# Patient Record
Sex: Female | Born: 1935 | Race: Asian | Hispanic: No | Smoking: Never smoker
Health system: Southern US, Community
[De-identification: ages and names within clinical notes are randomized; demographics above are authoritative.]

## PROBLEM LIST (undated history)

## (undated) DIAGNOSIS — I10 Essential (primary) hypertension: Secondary | ICD-10-CM

## (undated) DIAGNOSIS — L299 Pruritus, unspecified: Secondary | ICD-10-CM

## (undated) DIAGNOSIS — IMO0002 Reserved for concepts with insufficient information to code with codable children: Secondary | ICD-10-CM

## (undated) DIAGNOSIS — E119 Type 2 diabetes mellitus without complications: Secondary | ICD-10-CM

## (undated) HISTORY — PX: BACK SURGERY: SHX140

## (undated) HISTORY — PX: DILATION AND CURETTAGE, DIAGNOSTIC / THERAPEUTIC: SUR384

---

## 2004-10-13 ENCOUNTER — Ambulatory Visit: Payer: Self-pay | Admitting: Internal Medicine

## 2004-10-20 ENCOUNTER — Ambulatory Visit: Payer: Self-pay | Admitting: Internal Medicine

## 2004-11-10 ENCOUNTER — Ambulatory Visit: Payer: Self-pay | Admitting: Internal Medicine

## 2004-11-21 ENCOUNTER — Ambulatory Visit (HOSPITAL_COMMUNITY): Admission: RE | Admit: 2004-11-21 | Discharge: 2004-11-21 | Payer: Self-pay | Admitting: Family Medicine

## 2004-11-24 ENCOUNTER — Ambulatory Visit: Payer: Self-pay | Admitting: Internal Medicine

## 2004-12-08 ENCOUNTER — Ambulatory Visit: Payer: Self-pay | Admitting: Internal Medicine

## 2005-02-16 ENCOUNTER — Ambulatory Visit: Payer: Self-pay | Admitting: Internal Medicine

## 2005-06-22 ENCOUNTER — Ambulatory Visit: Payer: Self-pay | Admitting: Internal Medicine

## 2005-09-23 ENCOUNTER — Ambulatory Visit: Payer: Self-pay | Admitting: Family Medicine

## 2005-10-12 ENCOUNTER — Ambulatory Visit: Payer: Self-pay | Admitting: Internal Medicine

## 2005-12-16 ENCOUNTER — Ambulatory Visit: Payer: Self-pay | Admitting: Internal Medicine

## 2005-12-24 ENCOUNTER — Ambulatory Visit (HOSPITAL_COMMUNITY): Admission: RE | Admit: 2005-12-24 | Discharge: 2005-12-24 | Payer: Self-pay | Admitting: Family Medicine

## 2006-02-19 ENCOUNTER — Ambulatory Visit: Payer: Self-pay | Admitting: Internal Medicine

## 2006-03-03 ENCOUNTER — Ambulatory Visit: Payer: Self-pay | Admitting: Internal Medicine

## 2006-03-12 ENCOUNTER — Ambulatory Visit (HOSPITAL_COMMUNITY): Admission: RE | Admit: 2006-03-12 | Discharge: 2006-03-12 | Payer: Self-pay | Admitting: Internal Medicine

## 2006-03-12 ENCOUNTER — Ambulatory Visit: Payer: Self-pay | Admitting: Internal Medicine

## 2006-03-19 ENCOUNTER — Ambulatory Visit: Payer: Self-pay | Admitting: Internal Medicine

## 2006-03-23 ENCOUNTER — Ambulatory Visit: Payer: Self-pay | Admitting: Internal Medicine

## 2006-06-07 ENCOUNTER — Ambulatory Visit: Payer: Self-pay | Admitting: Internal Medicine

## 2006-11-30 ENCOUNTER — Ambulatory Visit: Payer: Self-pay | Admitting: Internal Medicine

## 2006-12-28 ENCOUNTER — Ambulatory Visit (HOSPITAL_COMMUNITY): Admission: RE | Admit: 2006-12-28 | Discharge: 2006-12-28 | Payer: Self-pay | Admitting: Internal Medicine

## 2007-01-14 ENCOUNTER — Encounter: Admission: RE | Admit: 2007-01-14 | Discharge: 2007-01-14 | Payer: Self-pay | Admitting: Internal Medicine

## 2007-05-30 DIAGNOSIS — L738 Other specified follicular disorders: Secondary | ICD-10-CM | POA: Insufficient documentation

## 2007-05-30 DIAGNOSIS — J309 Allergic rhinitis, unspecified: Secondary | ICD-10-CM | POA: Insufficient documentation

## 2007-05-30 DIAGNOSIS — E785 Hyperlipidemia, unspecified: Secondary | ICD-10-CM | POA: Insufficient documentation

## 2007-05-30 DIAGNOSIS — I10 Essential (primary) hypertension: Secondary | ICD-10-CM | POA: Insufficient documentation

## 2008-03-21 ENCOUNTER — Ambulatory Visit (HOSPITAL_COMMUNITY): Admission: RE | Admit: 2008-03-21 | Discharge: 2008-03-21 | Payer: Self-pay | Admitting: Internal Medicine

## 2009-04-08 ENCOUNTER — Ambulatory Visit (HOSPITAL_COMMUNITY): Admission: RE | Admit: 2009-04-08 | Discharge: 2009-04-08 | Payer: Self-pay | Admitting: Specialist

## 2010-02-18 HISTORY — PX: ANTERIOR CERVICAL DECOMP/DISCECTOMY FUSION: SHX1161

## 2010-03-04 ENCOUNTER — Inpatient Hospital Stay (HOSPITAL_COMMUNITY): Admission: RE | Admit: 2010-03-04 | Discharge: 2010-03-08 | Payer: Self-pay | Admitting: Neurosurgery

## 2011-01-11 ENCOUNTER — Encounter: Payer: Self-pay | Admitting: Internal Medicine

## 2011-03-11 ENCOUNTER — Other Ambulatory Visit (HOSPITAL_COMMUNITY): Payer: Self-pay | Admitting: Specialist

## 2011-03-11 DIAGNOSIS — Z1231 Encounter for screening mammogram for malignant neoplasm of breast: Secondary | ICD-10-CM

## 2011-03-16 LAB — BASIC METABOLIC PANEL
BUN: 13 mg/dL (ref 6–23)
CO2: 31 mEq/L (ref 19–32)
Calcium: 9.6 mg/dL (ref 8.4–10.5)
Chloride: 97 mEq/L (ref 96–112)
Creatinine, Ser: 0.66 mg/dL (ref 0.4–1.2)
GFR calc Af Amer: >60 mL/min (ref >60)
GFR calc non Af Amer: >60 mL/min (ref >60)
Glucose, Bld: 161 mg/dL — ABNORMAL HIGH (ref 70–99)
Potassium: 4.5 mEq/L (ref 3.5–5.1)
Sodium: 135 mEq/L (ref 135–145)

## 2011-03-16 LAB — CBC
HCT: 37.4 % (ref 36.0–46.0)
Hemoglobin: 12.8 g/dL (ref 12.0–15.0)
MCHC: 34.2 g/dL (ref 30.0–36.0)
MCV: 88.6 fL (ref 78.0–100.0)
Platelets: 326 10*3/uL (ref 150–400)
RBC: 4.22 MIL/uL (ref 3.87–5.11)
RDW: 12.8 % (ref 11.5–15.5)
WBC: 10 10*3/uL (ref 4.0–10.5)

## 2011-03-16 LAB — MRSA PCR SCREENING: MRSA by PCR: NEGATIVE

## 2011-03-16 LAB — SURGICAL PCR SCREEN
MRSA, PCR: NEGATIVE
Staphylococcus aureus: NEGATIVE

## 2011-03-16 LAB — GLUCOSE, CAPILLARY: Glucose-Capillary: 231 mg/dL — ABNORMAL HIGH (ref 70–99)

## 2011-03-20 ENCOUNTER — Ambulatory Visit (HOSPITAL_COMMUNITY)
Admission: RE | Admit: 2011-03-20 | Discharge: 2011-03-20 | Disposition: A | Discharge status: Home / Self Care | Payer: Medicare Other | Source: Ambulatory Visit | Attending: Specialist | Admitting: Specialist

## 2011-03-20 DIAGNOSIS — Z1231 Encounter for screening mammogram for malignant neoplasm of breast: Secondary | ICD-10-CM | POA: Insufficient documentation

## 2012-02-05 ENCOUNTER — Other Ambulatory Visit (HOSPITAL_COMMUNITY): Payer: Self-pay | Admitting: Specialist

## 2012-02-05 DIAGNOSIS — Z1231 Encounter for screening mammogram for malignant neoplasm of breast: Secondary | ICD-10-CM

## 2012-03-21 ENCOUNTER — Ambulatory Visit (HOSPITAL_COMMUNITY)
Admission: RE | Admit: 2012-03-21 | Discharge: 2012-03-21 | Disposition: A | Discharge status: Home / Self Care | Payer: Medicare Other | Source: Ambulatory Visit | Attending: Specialist | Admitting: Specialist

## 2012-03-21 DIAGNOSIS — Z1231 Encounter for screening mammogram for malignant neoplasm of breast: Secondary | ICD-10-CM | POA: Insufficient documentation

## 2012-05-25 ENCOUNTER — Ambulatory Visit (INDEPENDENT_AMBULATORY_CARE_PROVIDER_SITE_OTHER): Payer: Medicare Other | Admitting: Family Medicine

## 2012-05-25 VITALS — BP 123/68 | HR 66 | Temp 98.2°F | Resp 16 | Ht 59.5 in | Wt 111.0 lb

## 2012-05-25 DIAGNOSIS — M542 Cervicalgia: Secondary | ICD-10-CM

## 2012-05-25 DIAGNOSIS — G894 Chronic pain syndrome: Secondary | ICD-10-CM | POA: Insufficient documentation

## 2012-05-25 DIAGNOSIS — M5090 Cervical disc disorder, unspecified, unspecified cervical region: Secondary | ICD-10-CM

## 2012-05-25 DIAGNOSIS — M1990 Unspecified osteoarthritis, unspecified site: Secondary | ICD-10-CM | POA: Insufficient documentation

## 2012-05-25 MED ORDER — GABAPENTIN 100 MG PO CAPS: ORAL_CAPSULE | ORAL | Status: DC

## 2012-05-25 MED ORDER — TRAMADOL HCL 50 MG PO TABS: ORAL_TABLET | ORAL | Status: DC

## 2012-05-25 NOTE — Progress Notes (Signed)
Subjective: 78 year old [place] lady who had surgery on her neck 2 years ago. They used a left anterior approach, presumably on a cervical disc. She has been having more pain in the neck and upper back down the middle of the back. He also has pains in her hands and feet. No clear-cut radiation of the pain however. She went to another doctor gave her some tramadol. The surgeon had x-rayed her back 5 or 6 months ago and felt like the bony structures well good. He recommended she see a family doctor for pain control.Surgeon was [employer] at [facility].  Reviewed old reports in computer.  Objective: Alert oriented elderly lady. Has decreased range of motion of her neck in all planes. She is tender in the neck anteriorly and posteriorly. She's not not particularly tender in the upper back however. Motor strength is good in the arms and hands. She does have some arthritic changes in her hands, presumably the hand and feet discomfort is from the arthritis she has elsewhere.  Assessment: Chronic cervical pain Cervical disc disease Osteoarthritis Language barrier  Plan: Continue the tramadol Take acetaminophen two twice daily. Take Neurontin 100 mg twice daily  Return in one month for recheck

## 2012-05-25 NOTE — Patient Instructions (Addendum)
Take the following medicines on a regular basis: Tramadol 50 mg one twice daily Acetaminophen 500 mg 2 pills twice daily Gabapentin 100 mg one pill twice daily  This means she will be taking 4 pills every day with breakfast and 4 pills every day with supper. If she has problems with the medicines come in sooner, otherwise I want to see her back in one month. Call the office and find out when Dr. Alwyn Ren will be in the office and come in while I'm here so I can check her.  The Acetominophen (generic tylenol 500 mg) can be purchased over the counter at any pharmacy.

## 2013-03-07 ENCOUNTER — Other Ambulatory Visit (HOSPITAL_COMMUNITY): Payer: Self-pay | Admitting: Specialist

## 2013-03-07 DIAGNOSIS — Z1231 Encounter for screening mammogram for malignant neoplasm of breast: Secondary | ICD-10-CM

## 2013-03-22 ENCOUNTER — Ambulatory Visit (HOSPITAL_COMMUNITY)
Admission: RE | Admit: 2013-03-22 | Discharge: 2013-03-22 | Disposition: A | Discharge status: Home / Self Care | Payer: PRIVATE HEALTH INSURANCE | Source: Ambulatory Visit | Attending: Specialist | Admitting: Specialist

## 2013-03-22 DIAGNOSIS — Z1231 Encounter for screening mammogram for malignant neoplasm of breast: Secondary | ICD-10-CM | POA: Insufficient documentation

## 2013-04-10 ENCOUNTER — Institutional Professional Consult (permissible substitution): Payer: Medicaid Other | Admitting: Internal Medicine

## 2013-06-01 ENCOUNTER — Observation Stay (HOSPITAL_COMMUNITY)
Admission: EM | Admit: 2013-06-01 | Discharge: 2013-06-02 | Disposition: A | Discharge status: Home / Self Care | Payer: PRIVATE HEALTH INSURANCE | Attending: Family Medicine | Admitting: Family Medicine

## 2013-06-01 ENCOUNTER — Emergency Department (HOSPITAL_COMMUNITY): Payer: PRIVATE HEALTH INSURANCE

## 2013-06-01 ENCOUNTER — Encounter (HOSPITAL_COMMUNITY): Payer: Self-pay | Admitting: Emergency Medicine

## 2013-06-01 DIAGNOSIS — R531 Weakness: Secondary | ICD-10-CM

## 2013-06-01 DIAGNOSIS — Z79899 Other long term (current) drug therapy: Secondary | ICD-10-CM | POA: Insufficient documentation

## 2013-06-01 DIAGNOSIS — E785 Hyperlipidemia, unspecified: Secondary | ICD-10-CM | POA: Insufficient documentation

## 2013-06-01 DIAGNOSIS — R5381 Other malaise: Secondary | ICD-10-CM | POA: Insufficient documentation

## 2013-06-01 DIAGNOSIS — M5090 Cervical disc disorder, unspecified, unspecified cervical region: Secondary | ICD-10-CM | POA: Diagnosis present

## 2013-06-01 DIAGNOSIS — E876 Hypokalemia: Secondary | ICD-10-CM | POA: Insufficient documentation

## 2013-06-01 DIAGNOSIS — E878 Other disorders of electrolyte and fluid balance, not elsewhere classified: Secondary | ICD-10-CM | POA: Insufficient documentation

## 2013-06-01 DIAGNOSIS — G894 Chronic pain syndrome: Secondary | ICD-10-CM | POA: Insufficient documentation

## 2013-06-01 DIAGNOSIS — R634 Abnormal weight loss: Secondary | ICD-10-CM | POA: Insufficient documentation

## 2013-06-01 DIAGNOSIS — E871 Hypo-osmolality and hyponatremia: Principal | ICD-10-CM

## 2013-06-01 DIAGNOSIS — K5900 Constipation, unspecified: Secondary | ICD-10-CM | POA: Insufficient documentation

## 2013-06-01 DIAGNOSIS — R627 Adult failure to thrive: Secondary | ICD-10-CM | POA: Insufficient documentation

## 2013-06-01 DIAGNOSIS — E119 Type 2 diabetes mellitus without complications: Secondary | ICD-10-CM | POA: Insufficient documentation

## 2013-06-01 DIAGNOSIS — I10 Essential (primary) hypertension: Secondary | ICD-10-CM | POA: Diagnosis present

## 2013-06-01 HISTORY — DX: Essential (primary) hypertension: I10

## 2013-06-01 HISTORY — DX: Type 2 diabetes mellitus without complications: E11.9

## 2013-06-01 HISTORY — DX: Pruritus, unspecified: L29.9

## 2013-06-01 HISTORY — DX: Reserved for concepts with insufficient information to code with codable children: IMO0002

## 2013-06-01 LAB — CBC WITH DIFFERENTIAL/PLATELET
Basophils Absolute: 0 10*3/uL (ref 0.0–0.1)
Basophils Relative: 0 % (ref 0–1)
Eosinophils Absolute: 0.1 10*3/uL (ref 0.0–0.7)
Eosinophils Relative: 1 % (ref 0–5)
HCT: 36 % (ref 36.0–46.0)
Hemoglobin: 13.1 g/dL (ref 12.0–15.0)
MCH: 30.3 pg (ref 26.0–34.0)
MCHC: 36.4 g/dL — ABNORMAL HIGH (ref 30.0–36.0)
MCV: 83.3 fL (ref 78.0–100.0)
Monocytes Absolute: 1.2 10*3/uL — ABNORMAL HIGH (ref 0.1–1.0)
Monocytes Relative: 9 % (ref 3–12)
Neutro Abs: 10.8 10*3/uL — ABNORMAL HIGH (ref 1.7–7.7)
RDW: 12.2 % (ref 11.5–15.5)

## 2013-06-01 LAB — BASIC METABOLIC PANEL
BUN: 6 mg/dL (ref 6–23)
Calcium: 8.4 mg/dL (ref 8.4–10.5)
Creatinine, Ser: 0.43 mg/dL — ABNORMAL LOW (ref 0.50–1.10)
GFR calc Af Amer: >90 mL/min (ref >90)
GFR calc non Af Amer: >90 mL/min (ref >90)

## 2013-06-01 LAB — COMPREHENSIVE METABOLIC PANEL
AST: 27 U/L (ref 0–37)
Albumin: 3.6 g/dL (ref 3.5–5.2)
BUN: 7 mg/dL (ref 6–23)
Calcium: 8.6 mg/dL (ref 8.4–10.5)
Chloride: 84 mEq/L — ABNORMAL LOW (ref 96–112)
Creatinine, Ser: 0.49 mg/dL — ABNORMAL LOW (ref 0.50–1.10)
GFR calc non Af Amer: 90 mL/min — ABNORMAL LOW (ref >90)
Total Bilirubin: 0.9 mg/dL (ref 0.3–1.2)

## 2013-06-01 LAB — URINALYSIS, ROUTINE W REFLEX MICROSCOPIC
Ketones, ur: NEGATIVE mg/dL
Nitrite: NEGATIVE
Protein, ur: NEGATIVE mg/dL
pH: 7.5 (ref 5.0–8.0)

## 2013-06-01 LAB — TROPONIN I: Troponin I: <0.3 ng/mL (ref <0.30)

## 2013-06-01 LAB — CBC
HCT: 34.5 % — ABNORMAL LOW (ref 36.0–46.0)
MCHC: 35.1 g/dL (ref 30.0–36.0)
MCV: 84.8 fL (ref 78.0–100.0)
RDW: 12.5 % (ref 11.5–15.5)

## 2013-06-01 LAB — URINE MICROSCOPIC-ADD ON

## 2013-06-01 MED ORDER — ACETAMINOPHEN 325 MG PO TABS: 650.0000 mg | ORAL_TABLET | Freq: Four times a day (QID) | ORAL | Status: DC | PRN

## 2013-06-01 MED ORDER — DEXTROSE 5 % IV SOLN
1.0000 g | Freq: Once | INTRAVENOUS | Status: AC
  Administered 2013-06-01: 1 g via INTRAVENOUS
  Filled 2013-06-01: qty 10

## 2013-06-01 MED ORDER — METOPROLOL SUCCINATE ER 100 MG PO TB24
100.0000 mg | ORAL_TABLET | Freq: Every day | ORAL | Status: DC
  Administered 2013-06-02: 100 mg via ORAL
  Filled 2013-06-01: qty 1

## 2013-06-01 MED ORDER — SIMVASTATIN 20 MG PO TABS
20.0000 mg | ORAL_TABLET | Freq: Every day | ORAL | Status: DC
  Filled 2013-06-01: qty 1

## 2013-06-01 MED ORDER — ONDANSETRON HCL 4 MG/2ML IJ SOLN: 4.0000 mg | Freq: Four times a day (QID) | INTRAMUSCULAR | Status: DC | PRN

## 2013-06-01 MED ORDER — GABAPENTIN 100 MG PO CAPS
100.0000 mg | ORAL_CAPSULE | Freq: Two times a day (BID) | ORAL | Status: DC
  Administered 2013-06-02: 100 mg via ORAL
  Filled 2013-06-01 (×3): qty 1

## 2013-06-01 MED ORDER — ENALAPRIL MALEATE 5 MG PO TABS
5.0000 mg | ORAL_TABLET | Freq: Two times a day (BID) | ORAL | Status: DC
Start: 2013-06-01 — End: 2013-06-02
  Administered 2013-06-02: 5 mg via ORAL
  Filled 2013-06-01 (×3): qty 1

## 2013-06-01 MED ORDER — POTASSIUM CHLORIDE CRYS ER 20 MEQ PO TBCR
40.0000 meq | EXTENDED_RELEASE_TABLET | Freq: Once | ORAL | Status: AC
  Administered 2013-06-01: 40 meq via ORAL
  Filled 2013-06-01: qty 2

## 2013-06-01 MED ORDER — METFORMIN HCL 500 MG PO TABS
500.0000 mg | ORAL_TABLET | Freq: Two times a day (BID) | ORAL | Status: DC
  Administered 2013-06-02: 500 mg via ORAL
  Filled 2013-06-01 (×3): qty 1

## 2013-06-01 MED ORDER — TRAMADOL HCL 50 MG PO TABS
50.0000 mg | ORAL_TABLET | Freq: Two times a day (BID) | ORAL | Status: DC
  Administered 2013-06-01 – 2013-06-02 (×2): 50 mg via ORAL
  Filled 2013-06-01 (×4): qty 1

## 2013-06-01 MED ORDER — ACETAMINOPHEN 650 MG RE SUPP: 650.0000 mg | Freq: Four times a day (QID) | RECTAL | Status: DC | PRN

## 2013-06-01 MED ORDER — HEPARIN SODIUM (PORCINE) 5000 UNIT/ML IJ SOLN
5000.0000 [IU] | Freq: Three times a day (TID) | INTRAMUSCULAR | Status: DC
  Administered 2013-06-01 – 2013-06-02 (×2): 5000 [IU] via SUBCUTANEOUS
  Filled 2013-06-01 (×5): qty 1

## 2013-06-01 MED ORDER — SODIUM CHLORIDE 0.9 % IV BOLUS (SEPSIS)
500.0000 mL | Freq: Once | INTRAVENOUS | Status: AC
  Administered 2013-06-01: 500 mL via INTRAVENOUS

## 2013-06-01 MED ORDER — ONDANSETRON HCL 4 MG PO TABS: 4.0000 mg | ORAL_TABLET | Freq: Four times a day (QID) | ORAL | Status: DC | PRN

## 2013-06-01 NOTE — Progress Notes (Signed)
MD called to get labs done-RN called lab to have someone come up.

## 2013-06-01 NOTE — ED Notes (Signed)
Pt arrived by PTAR from MD office with c/o failure to thrive. Generalized weakness, UTI, dry cough, unable to sleep, unable to eat, weight loss 4lbs in a week.  139/75

## 2013-06-01 NOTE — ED Provider Notes (Signed)
History     CSN: [id number]  Arrival date & time 06/01/13  1017   First MD Initiated Contact with Patient 06/01/13 1020      Chief Complaint  Patient presents with  . Weight Loss  . Weakness  . Failure To Thrive  . Urinary Tract Infection    (Consider location/radiation/quality/duration/timing/severity/associated sxs/prior treatment) HPI Comments: Patient sent to the ER by ambulance from primary care provider's office. Patient has been seen twice in the office for generalized weakness. Patient was felt to be extremely weak and requiring further evaluation by the primary doctor. The patient reportedly has been diagnosed with urinary tract infection, but primary care provider was concerned about possible dehydration. Patient reportedly has had a 4 pound weight loss in one week.  Patient is a 79 y.o. female presenting with weakness and urinary tract infection. The history is provided by a relative and the patient. The history is limited by a language barrier. A language interpreter was used.  Weakness  Urinary Tract Infection    No past medical history on file.  No past surgical history on file.  No family history on file.  History  Substance Use Topics  . Smoking status: Never Smoker   . Smokeless tobacco: Not on file  . Alcohol Use: Not on file    OB History   Grav Para Term Preterm Abortions TAB SAB Ect Mult Living                  Review of Systems  Respiratory: Positive for cough.   Neurological: Positive for weakness.  All other systems reviewed and are negative.    Allergies  Lisinopril  Home Medications   Current Outpatient Rx  Name  Route  Sig  Dispense  Refill  . traMADol (ULTRAM) 50 MG tablet      Take one tablet at breakfast and one tablet at supper for pain   60 tablet   1     BP 125/59  Pulse 73  Temp(Src) 98.7 F (37.1 C) (Oral)  Resp 20  SpO2 100%  Physical Exam  Constitutional: She is oriented to person, place, and time. She  appears well-developed and well-nourished. No distress.  HENT:  Head: Normocephalic and atraumatic.  Right Ear: Hearing normal.  Left Ear: Hearing normal.  Nose: Nose normal.  Mouth/Throat: Oropharynx is clear and moist and mucous membranes are normal.  Eyes: Conjunctivae and EOM are normal. Pupils are equal, round, and reactive to light.  Neck: Normal range of motion. Neck supple.  Cardiovascular: Regular rhythm, S1 normal and S2 normal.  Exam reveals no gallop and no friction rub.   No murmur heard. Pulmonary/Chest: Effort normal and breath sounds normal. No respiratory distress. She exhibits no tenderness.  Abdominal: Soft. Normal appearance and bowel sounds are normal. There is no hepatosplenomegaly. There is no tenderness. There is no rebound, no guarding, no tenderness at McBurney's point and negative Murphy's sign. No hernia.  Musculoskeletal: Normal range of motion.  Neurological: She is alert and oriented to person, place, and time. She has normal strength. No cranial nerve deficit or sensory deficit. Coordination normal. GCS eye subscore is 4. GCS verbal subscore is 5. GCS motor subscore is 6.  Skin: Skin is warm, dry and intact. No rash noted. No cyanosis.  Psychiatric: She has a normal mood and affect. Her speech is normal and behavior is normal. Thought content normal.    ED Course  Procedures (including critical care time)  EKG:  Date: 06/01/2013  Rate: 72  Rhythm: normal sinus rhythm  QRS Axis: normal  Intervals: normal  ST/T Wave abnormalities: nonspecific T wave changes  Conduction Disutrbances:none  Narrative Interpretation:   Old EKG Reviewed: unchanged    Labs Reviewed  CBC WITH DIFFERENTIAL - Abnormal; Notable for the following:    WBC 13.4 (*)    MCHC 36.4 (*)    Neutrophils Relative % 81 (*)    Neutro Abs 10.8 (*)    Lymphocytes Relative 10 (*)    Monocytes Absolute 1.2 (*)    All other components within normal limits  COMPREHENSIVE METABOLIC PANEL -  Abnormal; Notable for the following:    Sodium 122 (*)    Potassium 3.3 (*)    Chloride 84 (*)    Glucose, Bld 102 (*)    Creatinine, Ser 0.49 (*)    GFR calc non Af Amer 90 (*)    All other components within normal limits  URINALYSIS, ROUTINE W REFLEX MICROSCOPIC - Abnormal; Notable for the following:    Leukocytes, UA MODERATE (*)    All other components within normal limits  URINE MICROSCOPIC-ADD ON - Abnormal; Notable for the following:    Bacteria, UA FEW (*)    All other components within normal limits  URINE CULTURE  TROPONIN I   Dg Chest 2 View  06/01/2013   *RADIOLOGY REPORT*  Clinical Data: Weakness.  CHEST - 2 VIEW  Comparison: 02/25/2010.  Findings: Poor inspiration.  Grossly normal sized heart and clear lungs.  Cervical spine fixation hardware.  IMPRESSION: No acute abnormality.   Original Report Authenticated By: Beckie Salts, M.D.     Diagnosis: Hyponatremia, UTI    MDM  Patient presented for evaluation of generalized weakness. Patient has been unable to eat for several weeks. She is demonstrating early satiety and has had nausea after eating, therefore has not been eating for at least a week. Patient likely has some slight dehydration malnourishment. Lab work reveals profound hyponatremia, likely adding to some of her weakness. There has not been any seizures. Urinalysis is suggestive of possible urinary tract infection as well, culture pending. Patient will be hospitalized as an unassigned patient for further workup.        Gilda Crease, MD 06/01/13 [phone]

## 2013-06-01 NOTE — H&P (Signed)
Family Medicine Teaching [facility] Admission History and Physical Service Pager: [phone]  Patient name: Robin Andrews Medical record number: [id number] Date of birth: February 01, 1931 Age: 79 y.o. Gender: female  Primary Care Provider: No primary provider on file.  Chief Complaint: Weakness  Assessment and Plan: ADIYA SELMER is a 79 y.o. year old female with PMH significant for recently diagnosed Type II diabetes presenting with generalized weakness, hyponatremia, and possible UTI, transferred from her PCP's office via ambulance.  # Weakness - Likely secondary to decreased PO intake over the last two weeks as patient has been reluctant to eat following her recent diagnosis of Type II diabetes per her grandson. Patient also reported some nausea with food intake.  No history concerning for occult cancer. [ ]  Encourage PO intake as tolerated [ ]  Zofran PRN for nausea (avoiding phenergan as patient is elderly) [ ]  Consult diabetes education counselor   # Hyponatremia, hypokalemia, hypochloremia - Likely also due to poor PO intake.  KDur in ED. [ ]  Recheck BMP tonight and tomorrow AM  # Suspected UTI - Elevated WBC at 13.4 and UA showed trace leukocytes, few squamous epithelials, and few bacteria.  Describes urinary frequency but no dysuria. [ ]  Ceftriaxone 1g IV x1 [ ]  Recheck CBC tomorrow to monitor WBC [ ]  F/u urine culture  # Type II diabetes - Patient should receive diabetes education prior to discharge [ ]  Order HgbA1c [ ]  Continue home metformin (will need to be held if contrast studies ordered)  # History of chronic pain - secondary to neck surgery [ ]  Continue home meds (gabapentin, tramadol)  # Dyslipidemia [ ]  will get simvastatin 20mg  in hospital [ ]  continue pravachol at discharge  # Hypertension - Patient has not been hypertensive since arrival. Will continue to monitor pressures. [ ]  continue enalapril and metoprolol; will hold amlodipine  # ?Psych history:  Patient on zyprexa and home but grandson does not know anything about this medicine and the patient does not know what she takes it for.  Will hold while in the hospital.   FEN/GI: cardiac diet, as tolerate; SLIV Prophylaxis: SQ Heparin Disposition: admit to observation; Home upon clinical improvement, diabetes education, and normalization of sodium Code Status: FULL per discussion with grandson and patient  History of Present Illness:  Robin Andrews is a 79 y.o. year old female with PMH significant for recently diagnosed Type II diabetes presenting with generalized weakness, hyponatremia, and possible UTI, transferred from a PCP's office via ambulance. PCP has reportedly seen the patient twice for generalized weakness and was concerned for dehydration today. The patient does not speak [place] (preferred language is [place] and is accompanied by her grandson who has limited knowledge of her health conditions, but is able to translate. The patient was diagnosed with Type II diabetes in the last two weeks and started on metformin. Per her grandson, she has since been "afraid to eat." Likely as a result, she has experienced a 4lb weight loss and weakness. She endorses some nausea with eating, but denies early satiety. At baseline, the patient has some intermittent cervical neck pain after undergoing surgery 2 years ago. The patient denies any other pain, fever, dizziness, trouble walking, diarrhea, and dysuria. She endorses some urinary frequency at baseline and possible constipation. The patient states that she is hungry and wants to go home.  In the ED, the patient was found to be hyponatremic at 122 with a WBC of 13.4. Her UA showed trace leukocytes,  few squamous epithelials, and few bacteria. She was given a saline bolus with marked improvement.  Patient Active Problem List   Diagnosis Date Noted  . Cervical disc disease 05/25/2012  . Chronic pain syndrome 05/25/2012  . Osteoarthritis  05/25/2012  . DYSLIPIDEMIA 05/30/2007  . HYPERTENSION, BENIGN ESSENTIAL 05/30/2007  . ALLERGIC RHINITIS 05/30/2007  . ASTEATOTIC ECZEMA 05/30/2007   Past Medical History: Past Medical History  Diagnosis Date  . Hypertension   . Diabetes mellitus without complication   . Pruritus   . Degenerative disc disease    Past Surgical History: Past Surgical History  Procedure Laterality Date  . Neck surgery     Social History: History  Substance Use Topics  . Smoking status: Never Smoker   . Smokeless tobacco: Not on file  . Alcohol Use: Not on file   For any additional social history documentation, please refer to relevant sections of EMR.  Family History: No family history on file. Allergies: Allergies  Allergen Reactions  . Lisinopril    No current facility-administered medications on file prior to encounter.   No current outpatient prescriptions on file prior to encounter.  Amlodipine 5mg  daily Enalapril 5mg  BID Gabapentin 100mg  BID Metformin 500mg  BID Metoprolol XL 100mg  daily Zyprexa 2.5mg  daily at bedtime Pravachol: 40mg  daily Tramadol 50mg  BID PRN  Review Of Systems: Per HPI   Physical Exam: BP 117/54  Pulse 92  Temp(Src) 98.8 F (37.1 C) (Oral)  Resp 20  SpO2 100% Exam: General: Patient lying comfortably on bed, in no acute distress HEENT: Anti-icteric sclera Cardiovascular: Regular rate and rhythm, no murmur heard Respiratory: Good air movement, upper airway noise transmittance but no wheezing or rales Abdomen: No TTP, active bowel sounds Extremities: No edema noted, 2+ radial and DP pulses Neuro: Alert and grossly oriented; gait is normal without any unsteadiness; 5/5 strength in all extremities   Labs and Imaging: CBC BMET   Recent Labs Lab 06/01/13 1057  WBC 13.4*  HGB 13.1  HCT 36.0  PLT 372    Recent Labs Lab 06/01/13 1057  NA 122*  K 3.3*  CL 84*  CO2 25  BUN 7  CREATININE 0.49*  GLUCOSE 102*  CALCIUM 8.6      Urinalysis    Component Value Date/Time   COLORURINE YELLOW 06/01/2013 1135   APPEARANCEUR CLEAR 06/01/2013 1135   LABSPEC 1.008 06/01/2013 1135   PHURINE 7.5 06/01/2013 1135   GLUCOSEU NEGATIVE 06/01/2013 1135   HGBUR NEGATIVE 06/01/2013 1135   BILIRUBINUR NEGATIVE 06/01/2013 1135   KETONESUR NEGATIVE 06/01/2013 1135   PROTEINUR NEGATIVE 06/01/2013 1135   UROBILINOGEN 0.2 06/01/2013 1135   NITRITE NEGATIVE 06/01/2013 1135   LEUKOCYTESUR MODERATE* 06/01/2013 1135   Troponin: <0.30  CXR: no acute abnormality    BOOTH, ERIN 06/01/2013, 6:07 PM

## 2013-06-01 NOTE — Progress Notes (Signed)
Held b/p med- b/p is 117/44. Order just put in to bolus pt.

## 2013-06-01 NOTE — Progress Notes (Signed)
Pt admitted to the unit with family. Pt is stable, alert and oriented per baseline, speaks limited english. Oriented to room, staff, and call bell. Grandson explained bed alarm and answered questions with daughter. Educated to call for any assistance. Bed in lowest position, call bell within reach- will continue to monitor.

## 2013-06-02 DIAGNOSIS — E871 Hypo-osmolality and hyponatremia: Principal | ICD-10-CM

## 2013-06-02 DIAGNOSIS — R5381 Other malaise: Secondary | ICD-10-CM

## 2013-06-02 LAB — BASIC METABOLIC PANEL
Calcium: 8.8 mg/dL (ref 8.4–10.5)
Creatinine, Ser: 0.45 mg/dL — ABNORMAL LOW (ref 0.50–1.10)
GFR calc Af Amer: >90 mL/min (ref >90)

## 2013-06-02 LAB — GLUCOSE, CAPILLARY: Glucose-Capillary: 262 mg/dL — ABNORMAL HIGH (ref 70–99)

## 2013-06-02 LAB — CBC
Platelets: 411 10*3/uL — ABNORMAL HIGH (ref 150–400)
RDW: 12.7 % (ref 11.5–15.5)
WBC: 10.9 10*3/uL — ABNORMAL HIGH (ref 4.0–10.5)

## 2013-06-02 LAB — URINE CULTURE: Colony Count: 9000

## 2013-06-02 LAB — HEMOGLOBIN A1C: Mean Plasma Glucose: 134 mg/dL — ABNORMAL HIGH (ref <117)

## 2013-06-02 MED ORDER — POLYETHYLENE GLYCOL 3350 17 G PO PACK
17.0000 g | PACK | Freq: Once | ORAL | Status: AC
  Administered 2013-06-02: 17 g via ORAL
  Filled 2013-06-02: qty 1

## 2013-06-02 MED ORDER — LIVING WELL WITH DIABETES BOOK
Freq: Once | Status: DC
  Filled 2013-06-02: qty 1

## 2013-06-02 MED ORDER — POLYETHYLENE GLYCOL 3350 17 G PO PACK: 17.0000 g | PACK | Freq: Two times a day (BID) | ORAL | Status: DC | PRN

## 2013-06-02 NOTE — Care Management Note (Signed)
    Page 1 of 1   06/02/2013     3:21:58 PM   CARE MANAGEMENT NOTE 06/02/2013  Patient:  Robin Andrews, Robin Andrews   Account Number:  [id number]  Date Initiated:  06/02/2013  Documentation initiated by:  Letha Cape  Subjective/Objective Assessment:   dx hyponatremia  admit- lives with spouse. speaks [place].     Action/Plan:   Anticipated DC Date:  06/02/2013   Anticipated DC Plan:  HOME/SELF CARE      DC Planning Services  CM consult      Choice offered to / List presented to:             Status of service:  Completed, signed off Medicare Important Message given?   (If response is "NO", the following Medicare IM given date fields will be blank) Date Medicare IM given:   Date Additional Medicare IM given:    Discharge Disposition:  HOME/SELF CARE  Per UR Regulation:  Reviewed for med. necessity/level of care/duration of stay  If discussed at Long Length of Stay Meetings, dates discussed:    Comments:  06/02/13 15:19 Letha Cape RN, BSN [phone] patient lives with spouse, speaks [place]. Interpreter here at 3 pm, patient sees Lerry Liner as her PCP , she does not need a new pcp, she can afford her medications and she has transportation home.  Patient will call her PCP to make appt.

## 2013-06-02 NOTE — Progress Notes (Addendum)
Inpatient Diabetes Program Recommendations  AACE/ADA: New Consensus Statement on Inpatient Glycemic Control (2013)  Target Ranges:  Prepandial:   less than 140 mg/dL      Peak postprandial:   less than 180 mg/dL (1-2 hours)      Critically ill patients:  140 - 180 mg/dL   Consult for new onset DM 2   Inpatient Diabetes Program Recommendations Oral Agents: Start patient on Metformin 500 mg bid for now at and discharge. Outpatient Referral: Pt new to DM.  Please order out-patient education at [facility]  Will order case management consult for assistance in finding a PCP. Thank you, Lenor Coffin, RN, CNS, Diabetes Coordinator [phone]) AD. Have ordered education per bedside RN's to use videos and exit care notes and request for OP education at [facility]

## 2013-06-02 NOTE — H&P (Signed)
FMTS Attending Admission Note: Nicolette Bang Pager [phone] I  have seen and examined this patient, reviewed their chart. I have discussed this patient with the resident. I agree with the resident's findings, assessment and care plan.  Patient seen and examined by me,I could not obtain good history due to language barrier and her family was not at bed side,she however did mentioned she had not moved her bowel for days by saying "no toilet". She denies pain.  O/E: She was comfortable in bed,well hydrated. Resp: Air entry equal B/L,no crepitations. CV: wnl Abd: benign. Ext: No edema.  A/P: 79 y/o female with. 1. Weakness and hyponatremia; Likely due too poor oral intake.     S/P Bolus NS X 2.     Sodium level improved and her strength improved.     Encourage oral intake.      2. UTI: Agree with A/B,f/u culture report.  3. DM: Newly diagnosed.     Upon review of her medication she is on Zyprexa which has potential for causing hyperglycemia.     A1C checked is 6.3     Consider d/c Zyprexa during next visit with her PCP if no indication to be on it. 4. HTN/HLD: continue home regimen.

## 2013-06-02 NOTE — Progress Notes (Signed)
Patient discharge teaching given, including activity, diabetic diet, follow-up appoints, and medications. Extensive teaching given on diabetic diet options. All teaching given through live hospital interpreter.Patient verbalized understanding of all discharge instructions. IV access was d/c'd. Vitals are stable. Skin is intact except as charted in most recent assessments. Pt to be escorted out by NT, to be driven home by family.

## 2013-06-02 NOTE — Discharge Summary (Signed)
Family Medicine Teaching [facility] Discharge Summary  Patient name: Robin Andrews Medical record number: [id number] Date of birth: Jul 10, 1931 Age: 79 y.o. Gender: female Date of Admission: 06/01/2013  Date of Discharge: 06/02/2013 Admitting Physician: Janit Pagan, MD  Primary Care Provider: No primary provider on file.  Indication for Hospitalization: hyponatremia  Discharge Diagnoses:  1. Hyponatremia secondary to poor PO intake 2. Recently diagnosed diabetes mellitus 3. Reported weakness 4. Suspected UTI 5. Chronic pain 6. Hyperlipidemia 7. Hypertension 8. Unknown psychiatric disorder 9. Constipation  Brief Hospital Course:  Robin Andrews is a 79 y.o. year old female with PMH significant for recently diagnosed Type II diabetes presenting with generalized weakness, hyponatremia, and possible UTI, transferred from her PCP's office via ambulance.   # Weakness - Was thought to be secondary to decreased PO intake over the last two weeks as patient has been reluctant to eat following her recent diagnosis of Type II diabetes per her grandson. Patient also reported some nausea with food intake. She did not have history concerning for occult cancer. We encouraged PO intake, which she was able to accomplish here in the hospital well. Recommend outpatient diabetes nutrition education. Her weakness improved and she was able to ambulate easily on the morning of discharge.  # Hyponatremia, hypokalemia, hypochloremia - Likely also due to poor PO intake. Given potassium PO in the ER. Hyponatremia improved with PO intake of food to sodium of 131 on day of discharge.  # Suspected UTI - Elevated WBC at 13.4 and UA showed trace leukocytes, few squamous epithelials, and few bacteria. Pt endorsed urinary frequency but no dysuria. Was given ceftriaxone 1g IV x1. Repeat CBC showed improvement in white count to 10.9. Urine culture was obtained and was pending at time of discharge.  # Type II  diabetes - It was difficult to accomplish full diabetes education in hospital secondary to language barrier, but this was attempted with our greatest effort. Hgb A1c was obtained and was 6.3. Recommend outpatient diabetes education continue. Continued home metformin. Patient received no contrasted IV studies.   # History of chronic pain - secondary to neck surgery  Continued home meds (gabapentin, tramadol)   # Dyslipidemia  Got simvastatin 20mg  in hospital per formulary, but continued pravachol at discharge.  # Hypertension Normotensive in ER. Continued enalapril and metoprolol in hospital, held amlodipine initially but resumed at discharge.  # ?Psych history: Patient on zyprexa and home but grandson did not know anything about this medicine and the patient did not know what she takes it for. Held while in hospital but resumed on discharge. Would consider discontinuing this medication if deemed appropriate given possible side effects.  # Constipation: on the morning of discharge patient reported she had not had a bowel movement in 3 days. She was given miralax here in the hospital and will continue to use it at home as needed.  Consultations: none  Procedures: none  Significant Labs and Imaging:   CMET  Lab  06/01/13 1057  06/01/13 2112  06/02/13 0540   NA  122*  126*  131*   K  3.3*  3.6  4.1   CL  84*  92*  95*   CO2  25  24  26    BUN  7  6  4*   CREATININE  0.49*  0.43*  0.45*   GLUCOSE  102*  192*  135*   CALCIUM  8.6  8.4  8.8   AST  27  --  --  ALT  25  --  --   ALKPHOS  76  --  --   PROT  6.7  --  --   ALBUMIN  3.6  --  --    CBC  Lab  06/01/13 1057  06/01/13 2112  06/02/13 0540   WBC  13.4*  13.4*  10.9*   HGB  13.1  12.1  12.9   HCT  36.0  34.5*  37.4   PLT  372  344  411*    CXR: no acute abnormality Hgb A1c 6.3  Urinalysis    Component Value Date/Time   COLORURINE YELLOW 06/01/2013 1135   APPEARANCEUR CLEAR 06/01/2013 1135   LABSPEC 1.008 06/01/2013  1135   PHURINE 7.5 06/01/2013 1135   GLUCOSEU NEGATIVE 06/01/2013 1135   HGBUR NEGATIVE 06/01/2013 1135   BILIRUBINUR NEGATIVE 06/01/2013 1135   KETONESUR NEGATIVE 06/01/2013 1135   PROTEINUR NEGATIVE 06/01/2013 1135   UROBILINOGEN 0.2 06/01/2013 1135   NITRITE NEGATIVE 06/01/2013 1135   LEUKOCYTESUR MODERATE* 06/01/2013 1135    Discharge Medications:    Medication List    TAKE these medications       amLODipine 5 MG tablet  Commonly known as:  NORVASC  Take 5 mg by mouth daily.     enalapril 5 MG tablet  Commonly known as:  VASOTEC  Take 5 mg by mouth 2 (two) times daily.     gabapentin 100 MG capsule  Commonly known as:  NEURONTIN  Take 100 mg by mouth 2 (two) times daily with a meal. Breakfast and supper.     metFORMIN 500 MG tablet  Commonly known as:  GLUCOPHAGE  Take 500 mg by mouth 2 (two) times daily with a meal.     metoprolol succinate 100 MG 24 hr tablet  Commonly known as:  TOPROL-XL  Take 100 mg by mouth daily. Take with or immediately following a meal.     OLANZapine 2.5 MG tablet  Commonly known as:  ZYPREXA  Take 2.5 mg by mouth at bedtime.     polyethylene glycol packet  Commonly known as:  MIRALAX / GLYCOLAX  Take 17 g by mouth 2 (two) times daily as needed (constipation).     pravastatin 40 MG tablet  Commonly known as:  PRAVACHOL  Take 40 mg by mouth daily.     traMADol 50 MG tablet  Commonly known as:  ULTRAM  Take 50 mg by mouth 2 (two) times daily. Breakfast and supper.        Issues for Follow Up:  - consider d/c of zyprexa due to potential side effects, unclear indication (although pt did not give [place] a great history regarding this medication) - recommend outpatient diabetes education, specifically regarding nutrition - f/u constipation  Outstanding Results: urine cx (pending at time of d/c)      Follow-up Information   Schedule an appointment as soon as possible for a visit with Jearld Lesch, MD. (within 3-4 days)    Contact  information:   [address] [place] [address] [phone]      Discharge Day Exam: BP 111/66  Pulse 83  Temp(Src) 98.5 F (36.9 C) (Oral)  Resp 16  Ht 4\' 9"  (1.448 m)  Wt 108 lb 3.9 oz (49.1 kg)  BMI 23.42 kg/m2  SpO2 96% Gen: NAD Heart: RRR Lungs: CTAB Abd: mildly distended but nontender to palpation Neuro: grossly nonfocal, speech appears normal, stands up and walks around with ease.  Discharge Condition: Stable  Discharge  Disposition: Home  Discharge Instructions: Please refer to Patient Instructions section of EMR for full details.  Patient was counseled important signs and symptoms that should prompt return to medical care, changes in medications, dietary instructions, activity restrictions, and follow up appointments.    Levert Feinstein, MD Family Medicine PGY-1

## 2013-06-02 NOTE — Plan of Care (Signed)
Problem: Food- and Nutrition-Related Knowledge Deficit (NB-1.1) Goal: Nutrition education Formal process to instruct or train a patient/client in a skill or to impart knowledge to help patients/clients voluntarily manage or modify food choices and eating behavior to maintain or improve health. Outcome: Completed/Met Date Met:  06/02/13 RD consulted for diet education regarding new onset DM.   Pt does not speak english. In person interpreter utilized to discuss with pt the need to eat smaller portions of carbohydrate containing foods, what foods will effect blood sugar, and what foods to eat more of. Encouraged pt not to limit protein and non-starchy vegetables. Per pt, she was not eating meat or carbohydrate foods. Pt agreeable to eat small portions of foods for better blood sugar control.  Expect fair compliance with diet. No additional nutrition interventions warranted at this time. Please consult as needed.    Robin Andrews RD, LDN Pager [phone] After Hours pager [phone]

## 2013-06-04 NOTE — Discharge Summary (Signed)
FMTS Attending Admission Note: Vong Garringer,MD I  have seen and examined this patient, reviewed their chart. I have discussed this patient with the resident. I agree with the resident's findings, assessment and care plan.  

## 2013-10-16 ENCOUNTER — Ambulatory Visit (INDEPENDENT_AMBULATORY_CARE_PROVIDER_SITE_OTHER): Payer: PRIVATE HEALTH INSURANCE | Admitting: Family Medicine

## 2013-10-16 VITALS — BP 122/72 | HR 70 | Temp 98.5°F | Resp 17 | Ht 59.5 in | Wt 106.0 lb

## 2013-10-16 DIAGNOSIS — N342 Other urethritis: Secondary | ICD-10-CM

## 2013-10-16 DIAGNOSIS — R300 Dysuria: Secondary | ICD-10-CM

## 2013-10-16 DIAGNOSIS — L738 Other specified follicular disorders: Secondary | ICD-10-CM

## 2013-10-16 LAB — POCT URINALYSIS DIPSTICK
Bilirubin, UA: NEGATIVE
Blood, UA: NEGATIVE
Glucose, UA: NEGATIVE
Ketones, UA: NEGATIVE
Leukocytes, UA: NEGATIVE
Nitrite, UA: NEGATIVE
Protein, UA: NEGATIVE
Spec Grav, UA: 1.015
Urobilinogen, UA: 0.2
pH, UA: 7

## 2013-10-16 LAB — POCT UA - MICROSCOPIC ONLY
Bacteria, U Microscopic: NEGATIVE
Casts, Ur, LPF, POC: NEGATIVE
Crystals, Ur, HPF, POC: NEGATIVE
Mucus, UA: NEGATIVE
RBC, urine, microscopic: NEGATIVE
WBC, Ur, HPF, POC: NEGATIVE
Yeast, UA: NEGATIVE

## 2013-10-16 MED ORDER — ESTROGENS, CONJUGATED 0.625 MG/GM VA CREA: TOPICAL_CREAM | VAGINAL | Status: DC

## 2013-10-16 NOTE — Progress Notes (Signed)
Subjective: 79 year old lady who is here for a couple of concerns. She's been having intermittent dysuria for a long time. She's been given creams to use, which seemed to help, but then his symptoms have returned to her. She also has very dry skin which itches a lot.  Objective: Skin is very dry and flaky. She has ecchymoses where she has scratched.  Urethral orifice appears inflamed, with atrophic urethra appearance   Results for orders placed in visit on 10/16/13  POCT UA - MICROSCOPIC ONLY      Result Value Range   WBC, Ur, HPF, POC neg     RBC, urine, microscopic neg     Bacteria, U Microscopic neg     Mucus, UA neg     Epithelial cells, urine per micros 1-3     Crystals, Ur, HPF, POC neg     Casts, Ur, LPF, POC neg     Yeast, UA neg    POCT URINALYSIS DIPSTICK      Result Value Range   Color, UA light yellow     Clarity, UA clear     Glucose, UA neg     Bilirubin, UA neg     Ketones, UA neg     Spec Grav, UA 1.015     Blood, UA neg     pH, UA 7.0     Protein, UA neg     Urobilinogen, UA 0.2     Nitrite, UA neg     Leukocytes, UA Negative     Assessment: Atrophic urethritis Dry skin eczema  Plan: Bathed less frequency Apply mineral oral Use a tiny bit of estrogen cream around the urethra at bedtime  Return if worse

## 2013-10-16 NOTE — Patient Instructions (Signed)
Decrease bathing to every other day rather than daily. Avoid long hot baths or showers as that tends to make the skin dryer.  Use an oil-based soap such as Dove unscented.  Immediately after drying off apply a small amount of mineral oil to the skin. This may be purchased without a prescription  Take Allegra (fexofenadine) one at bedtime for itching. This may be purchased without a prescription  Use a tiny amount of the hormone cream at the opening of the urethra (pee hole) each night at bedtime.  If not improving return in one month

## 2016-09-02 ENCOUNTER — Ambulatory Visit (INDEPENDENT_AMBULATORY_CARE_PROVIDER_SITE_OTHER): Payer: Medicaid Other | Admitting: Physician Assistant

## 2016-09-02 DIAGNOSIS — E118 Type 2 diabetes mellitus with unspecified complications: Secondary | ICD-10-CM | POA: Diagnosis not present

## 2016-09-02 DIAGNOSIS — I10 Essential (primary) hypertension: Secondary | ICD-10-CM | POA: Diagnosis not present

## 2016-09-02 LAB — COMPLETE METABOLIC PANEL WITH GFR
ALK PHOS: 88 U/L (ref 33–130)
ALT: 17 U/L (ref 6–29)
AST: 20 U/L (ref 10–35)
Albumin: 4.4 g/dL (ref 3.6–5.1)
BILIRUBIN TOTAL: 0.5 mg/dL (ref 0.2–1.2)
BUN: 17 mg/dL (ref 7–25)
CO2: 25 mmol/L (ref 20–31)
Calcium: 9.5 mg/dL (ref 8.6–10.4)
Chloride: 99 mmol/L (ref 98–110)
Creat: 0.85 mg/dL (ref 0.60–0.88)
GFR, EST AFRICAN AMERICAN: 73 mL/min (ref >=60)
GFR, EST NON AFRICAN AMERICAN: 64 mL/min (ref >=60)
Glucose, Bld: 171 mg/dL — ABNORMAL HIGH (ref 65–99)
Potassium: 3.7 mmol/L (ref 3.5–5.3)
Sodium: 135 mmol/L (ref 135–146)
TOTAL PROTEIN: 7.6 g/dL (ref 6.1–8.1)

## 2016-09-02 MED ORDER — GLIMEPIRIDE 2 MG PO TABS: 2.0000 mg | ORAL_TABLET | Freq: Every day | ORAL | 0 refills | Status: DC

## 2016-09-02 MED ORDER — AMLODIPINE BESYLATE 5 MG PO TABS: 5.0000 mg | ORAL_TABLET | Freq: Every day | ORAL | 1 refills | Status: DC

## 2016-09-02 NOTE — Patient Instructions (Addendum)
B?nh ti?u ???ng tp 2, Ng??i l?n (Type 2 Diabetes Mellitus, Adult) B?nh ti?u ???ng tp 2, th??ng g?i ??n gi?n l ti?u ???ng tp 2, l m?t b?nh ko di (m?n tnh). Trong ti?u ???ng tp 2, tuy?n t?y khng s?n xu?t ?? insulin (hocmon), cc t? bo t ?p ?ng v?i insulin lm cho ( khng insulin), ho?c c? hai. Thng th??ng, insulin v?n chuy?n ???ng t? th?c ?n vo cc t? bo ? m. Cc t? bo ? m s? d?ng ???ng ?? s?n sinh ra n?ng l??ng. Thi?u h?t insulin ho?c khng ?p ?ng bnh th??ng v?i insulin gy ra l??ng ???ng d? th?a tch t? trong mu thay v ?i vo cc t? bo ? m. K?t qu? l, lm cho l??ng ???ng trong mu cao (t?ng ???ng huy?t). ?nh h??ng c?a hm l??ng ???ng (glucose) cao c th? gy ra nhi?u bi?n ch?ng.  B?nh ti?u ???ng tp 2 tr??c ?y cn ???c g?i l b?nh ti?u ???ng kh?i pht ? ng??i l?n, nh?ng n c th? x?y ra ? b?t c? l?a tu?i no.  CC Y?U T? NGUY C?  M?t ng??i d? b? b?nh ti?u ???ng tp 2 n?u c ai ? trong gia ?nh b? b?nh ny, ??ng th?i c m?t ho?c nhi?u y?u t? nguy c? chnh sau ?y:  T?ng cn ho?c th?a cn ho?c bo ph.  L?i s?ng t ho?t ??ng.  Ti?n s? lin t?c ?n th?c ?n nhi?u n?ng l??ng. Duy tr cn n?ng bnh th??ng v ho?t ??ng thn th? th??ng xuyn c th? lm gi?m nguy c? pht tri?n b?nh ti?u ???ng tp 2. TRI?U CH?NG  Ban ??u, m?t ng??i b? b?nh ti?u ???ng tp 2 c th? khng c cc tri?u ch?ng. Cc tri?u ch?ng c?a b?nh ti?u ???ng tp 2 xu?t hi?n t? t?. Cc tri?u ch?ng bao g?m:  Kht n??c nhi?u (ch?ng kht nhi?u).  Ti?u ti?n nhi?u (?a ni?u).  ?i ti?u nhi?u vo ban ?m (ti?u ?m).  Thay ??i cn n?ng m?t cch ??t ng?t ho?c khng r nguyn nhn.  Th??ng xuyn b? nhi?m trng ti pht.  M?t m?i (m?t)  Y?u.  Thay ??i th? l?c, ch?ng h?n nh? nhn m?.  Mi tri cy trong h?i th? c?a qu v?.  ?au b?ng.  Bu?n nn ho?c nn m?a.  V?t c?t ho?c v?t b?m tm lu lnh.  ?au bu?t ho?c t ? bn tay ho?c bn chn.  V?t th??ng h? trn da (lot). CH?N ?ON B?nh ti?u ???ng tp 2  th??ng khng ???c ch?n ?on cho ??n khi xu?t hi?n cc bi?n ch?ng c?a b?nh ti?u ???ng. B?nh ti?u ???ng tp 2 ???c ch?n ?on khi xu?t hi?n cc tri?u ch?ng c?ng nh? bi?n ch?ng v khi l??ng ???ng huy?t t?ng. L??ng ???ng huy?t c th? ???c ki?m tra b?ng m?t ho?c nhi?u xt nghi?m mu sau ?y:  Xt nghi?m ???ng huy?t lc ?i. Qu v? s? khng ???c php ?n trong t nh?t l 8 ti?ng tr??c khi l?y m?u mu.  Xt nghi?m ???ng huy?t ng?u nhin. ???ng huy?t ???c xt nghi?m b?t k? lc no trong ngy, b?t k? qu v? ?n lc no.  Xt nghi?m ???ng huy?t A1c hemoglobin. Xt nghi?m A1c hemoglobin cung c?p thng tin v? vi?c ki?m sot ???ng huy?t trong 3 thng tr??c ?.  Xt nghi?m dung n?p glucose theo ???ng u?ng (OGTT). ???ng huy?t c?a qu v? ???c ?o sau khi qu v? ch?a ?n (nh?n ?n) trong 2 gi? v sau ? l sau khi qu v? u?ng ?? u?ng c ch?a glucose. ?  I?U TR?   Qu v? c th? c?n dng insulin ho?c thu?c tr? ti?u ???ng hng ngy ?? gi? cho l??ng ???ng huy?t trong ph?m vi mong mu?n.  N?u qu v? dng insulin, qu v? c th? c?n ?i?u ch?nh li?u thu?c ty thu?c vo l??ng carbohydrate m qu v? ?n trong m?i b?a ?n chnh ho?c b?a ?n nh?.  Thay ??i l?i s?ng ???c khuy?n ngh? l m?t ph?n trong bi?n php ?i?u tr? c?a qu v?. Nh?ng thay ??i ny c th? bao g?m:  Tun theo m?t ch? ?? ?n c nhn ha do m?t chuyn gia [place] d??ng ??a ra.  T?p th? d?c hng ngy. Chuyn gia ch?m [place] s?c kh?e s? ??t cc m?c tiu ?i?u tr? c nhn ha cho qu v? d?a theo tu?i, cc lo?i thu?c c?a qu v?, th?i gian qu v? ? b? ti?u t??ng, v b?t k? tnh tr?ng b?nh l no khc m qu v? c. Thng th??ng, m?c tiu ?i?u tr? l duy tr n?ng ?? glucose trong mu:  Tr??c khi ?n (tr??c b?a ?n): 80-130 mg/dL.  Sau khi ?n (sau b?a ?n): d??i 180 mg/dL.  A1c: th?p h?n 6,5-7%. H??NG D?N CH?M [place] T?I NH   L??ng A1c hemoglobin c?a qu v? ???c ki?m tra hai l?n m?i n?m.  Th?c hi?n vi?c theo di ???ng huy?t hng ngy theo ch? d?n c?a chuyn gia ch?m [place] s?c  kh?e.  Theo di [place] trong n??c ti?u khi qu v? b? b?nh v theo ch? d?n c?a chuyn gia ch?m [place] s?c kh?e.  S? d?ng thu?c tr? ti?u ???ng ho?c insulin theo ch? d?n c?a chuyn gia ch?m [place] s?c kh?e ?? duy tr l??ng ???ng huy?t trong ph?m vi mong mu?n.  Khng bao gi? ?? h?t thu?c tr? ti?u ???ng ho?c insulin. Thu?c c?n ph?i dng hng ngy.  N?u qu v? ?ang dng insulin, qu v? c th? ?i?u ch?nh l??ng insulin d?a vo l??ng carbohydrates qu v? ?n. Carbohydrate c th? lm t?ng l??ng ???ng huy?t nh?ng c?n ph?i bao g?m trong ch? ?? ?n u?ng c?a qu v?. Carbohydrate cung c?p vitamin, khong ch?t v ch?t x?, l m?t ph?n thi?t y?u c?a ch? ?? ?n u?ng c l?i cho s?c kh?e. Carbohydrate ???c tm th?y trong tri cy, rau, ng? c?c, cc s?n ph?m t? s?a, cc lo?i ??u v cc lo?i th?c ph?m c b? sung thm ???ng.  ?n th?c ?n c l?i cho s?c kh?e. Qu v? c?n h?n g?p m?t chuyn gia [place] d??ng c ??ng k hnh ngh? ?? gip qu v? ??a ra m?t k? ho?ch ?n u?ng ph h?p.  Gi?m cn n?u qu v? th?a cn.  Mang theo th? c?nh bo y t? ho?c ?eo ?? trang s?c c c?nh bo y t?.  Mang theo ?? ?n nh? ch?a 15 gam carbohydrate m?i lc ?? ?i?u tr? h? ???ng huy?t (h? ???ng huy?t). M?t s? v d? v? ?? ?n nh? ch?a 15 gam carbohydrate bao g?m:  Vin glucose, 3 ho?c 4.  Gel glucose, ?ng 15 gam.  Nho kh, 2 mu?ng (24 gam).  Th?ch hnh h?t ??u, 6.  Bnh quy hnh con gi?ng, 8.  N??c u?ng c ga thng th??ng, 4 aox? (120 ml)  K?o chp chp, 9.  Nh?n bi?t h? ???ng huy?t. H? ???ng huy?t x?y ra khi l??ng ???ng huy?t t? 70 mg/dL tr? xu?ng. Nguy c? h? ???ng huy?t gia t?ng khi nh?n ?n ho?c b? b?a, trong v sau khi t?p th? d?c c??ng ?? cao v trong [employer]  ng?. Cc tri?u ch?ng h? ???ng huy?t c th? bao g?m:  Run ho?c l?c.  Gi?m kh? n?ng t?p trung.  ?? m? hi.  Nh?p tim t?ng.  ?au ??u.  Kh mi?ng.  ?i.  D? b? kch thch.  Lo u.  Ng? khng yn.  Thay ??i l?i ni ho?c s? ph?i h?p.  B? l l?n.  ?i?u tr? h? ???ng huy?t k?p th?i.  N?u qu v? t?nh to v c th? nu?t m?t cch an ton, hy theo quy t?c 15:15:  Dng 15-20 gam glucose ho?c carbohydrate c tc d?ng nhanh. L?a ch?n tc ??ng nhanh bao g?m gel glucose, vin glucose ho?c 4 aox? (120 ml) n??c p tri cy, soda bnh th??ng ho?c s?a t bo.  Ki?m tra l??ng ???ng huy?t c?a qu v? 15 pht sau khi u?ng glucose.  Dng t? 15-20 gam glucose tr? ln n?u l??ng ???ng huy?t ???c ?o l?i v?n ? m?c 70 mg/dL tr? xu?ng.  ?n theo b?a ?n bnh th??ng ho?c ?? ?n nh? trong vng 1 ti?ng sau khi l??ng ???ng huy?t tr? l?i bnh th??ng.  Hy c?nh gic v?i c?m gic r?t kht v ?i ti?u ti?n nhi?u l?n h?n bnh th??ng v ?y l nh?ng d?u hi?u s?m c?a t?ng ???ng huy?t. Vi?c pht hi?n t?ng ???ng huy?t s?m cho php ?i?u tr? k?p th?i. ?i?u tr? t?ng ???ng huy?t theo ch? d?n c?a chuyn gia ch?m [place] s?c kh?e.  M?i tu?n tham gia vo t nh?t 150 pht ho?t ??ng thn th? v?i c??ng ?? trung bnh, phn b? trong t nh?t 3 ngy trong tu?n ho?c theo ch? d?n c?a chuyn gia ch?m [place] s?c kh?e. Ngoi ra, qu v? nn tham gia vo bi t?p c s?c c?n t nh?t 2 l?n m?t tu?n ho?c theo ch? d?n c?a chuyn gia ch?m [place] s?c kh?e. C? g?ng dnh khng qu 90 pht m?i l?n khng ho?t ??ng.  ?i?u ch?nh thu?c v l??ng th?c ?n khi c?n n?u qu v? b?t ??u m?t bi t?p ho?c m?t mn th? thao m?i.  Lm theo k? ho?ch trong ngy b? b?nh c?a qu v? b?t c? lc no m qu v? khng th? ?n ho?c u?ng nh? bnh th??ng.  Khng s? d?ng cc s?n ph?m thu?c l bao g?m thu?c l ht, thu?c l d?ng nhai ho?c thu?c l ?i?n t?. N?u qu v? c?n gip ?? ?? cai thu?c, hy h?i chuyn gia ch?m [place] s?c kh?e.  Gi?i h?n l??ng r??u qu v? u?ng khng qu 1 ly m?i ngy v?i ph? n? khng mang thai v 2 ly m?i ngy v?i nam gi?i. Qu v? ch? nn u?ng r??u khi ?n. Ni chuy?n v?i chuyn gia ch?m [place] s?c kh?e xem u?ng r??u c an ton cho qu v? hay khng. Cho chuyn gia ch?m [place] s?c kh?e bi?t n?u qu v? u?ng r??u vi l?n m?i tu?n.  Tun th? m?i cu?c h?n khm l?i theo ch? d?n  c?a chuyn gia ch?m [place] s?c kh?e. ?i?u ny l quan tr?ng.  S?p x?p bu?i khm m?t ngay sau khi ch?n ?on b?nh ti?u ???ng tp 2 v sau ? l hng n?m.  Th?c hi?n ch?m [place] da v bn chn hng ngy. Ki?m tra da v bn chn hng ngy xem c v?t c?t, v?t b?m tm, t?y ??, v?n ?? v? mng, ch?y mu, m?n n??c hay l? lot khng. Bn chn c?n ???c chuyn gia ch?m [place] s?c kh?e khm hng n?m.  ?nh r?ng v l?i t nh?t hai l?n m?i ngy  v dng ch? nha khoa t nh?t m?t l?n m?i ngy. G?p nha s? ?? khm l?i th??ng xuyn.  Chia s? k? ho?ch qu?n l b?nh ti?u ???ng c?a qu v? ? n?i lm vi?c ho?c tr??ng h?c c?a qu v?.  C?p nh?t l?ch tim ch?ng c?a qu v?. Qu v? nn tim v?c xin cm (b?nh cm) hng n?m. Qu v? c?ng nn tim v?c xin vim ph?i (ph? c?u khu?n). N?u qu v? 67 tu?i tr? ln v ch?a ???c tim v?c xin vim ph?i, v?c xin ny c th? ???c tim m?t lo?t hai m?i ring. Hy h?i chuyn gia ch?m [place] s?c kh?e xem nn tim thm lo?i v?c xin no.  H?c cch qu?n l c?ng th?ng.  Xin ???c gio d?c v h? tr? v? b?nh ti?u ???ng th??ng xuyn khi c?n.  Tham gia ho?c tm cch ph?c h?i ch?c n?ng khi c?n thi?t ?? duy tr ho?c c?i thi?n kh? n?ng ??c l?p v ch?t l??ng cu?c s?ng. Yu c?u chuy?n sang v?t l tr? li?u ho?c li?u php ngh? nghi?p n?u qu v? b? t bn chn ho?c t tay, ho?c kh ch?i ??u, kh m?c qu?n o, ?n u?ng ho?c ho?t ??ng th? ch?t. ?I [facility] N?U:   Qu v? khng th? ?n ho?c u?ng trong h?n 6 ti?ng.  Qu v? b? bu?n nn v nn m?a trong h?n 6 ti?ng.  L??ng ???ng huy?t c?a qu v? cao trn 240 mg/dL.  C thay ??i tr?ng thi tinh th?n.  Qu v? b? thm m?t c?n b?nh nghim tr?ng.  Qu v? b? tiu ch?y trong h?n 6 ti?ng.  Qu v? ? b? ?m ho?c b? s?t trong m?t vi ngy v khng ?? h?n.  Qu v? b? ?au trong khi tham gia b?t k? ho?t ??ng thn th? no. NGAY L?P T?C ?I K[facility] N?U:  Qu v? b? kh th?.  Qu v? c l??ng ketone ? m?c trung bnh ??n cao.   Thng tin ny khng nh?m m?c ?ch thay th? cho l?i khuyn m  chuyn gia ch?m [place] s?c kh?e ni v?i qu v?. Hy b?o ??m qu v? ph?i th?o lu?n b?t k? v?n ?? g m qu v? c v?i chuyn gia ch?m [place] s?c kh?e c?a qu v?.   Document Released: 12/07/2005 Document Revised: 08/28/2015 Elsevier Interactive Patient Education [employer]     IF you received an x-ray today, you will receive an invoice from [facility] Radiology. Please contact [facility] at [phone] with questions or concerns regarding your invoice.   IF you received labwork today, you will receive an invoice from [employer]. Please contact [facility] at [phone] with questions or concerns regarding your invoice.   Our billing staff will not be able to assist you with questions regarding bills from these companies.  You will be contacted with the lab results as soon as they are available. The fastest way to get your results is to activate your My Chart account. Instructions are located on the last page of this paperwork. If you have not heard from [place] regarding the results in 2 weeks, please contact this office.

## 2016-09-02 NOTE — Progress Notes (Addendum)
Patient ID: Robin Stackhong T Lockyer, female   DOB: 04/14/1931, 82 y.o.   MRN: [id number] [facility] [address], [place] [id number] [phone]  By signing my name below, I, Essence Howell, attest that this documentation has been prepared under the direction and in the presence of Trena PlattStephanie English, PA-C Electronically Signed: Charline BillsEssence Howell, ED Scribe 09/02/2016 at 9:09 AM.  Date:  09/02/2016   Name:  Robin Andrews   DOB:  09/30/1931   MRN:  [id number]  PCP:  No primary care provider on file.   History of Present Illness:  Robin Stackhong T Carline is a 82 y.o. female patient, with a h/o HTN and DM, who [facility] DM and HTN. Pt's native language is vietnamese; her friend is present to translate. [employer] interpreter was also used during the second half of the exam, ID # [id number]. Pt was diagnosed with DM 3 years ago and was followed by Dr. Mayford KnifeWilliams but her friend states that his practice has closed. Pt has been compliant with Glimepiride b.i.d. and denies complications. Pt does not check her blood glucose at home. Pt states that she has a home monitoring machine but was never trained on how to use the machine. She states that she exercises 0.5 hour daily. She states that she consumes a lot of rice and bananas; denies consuming a lot of canned or salty foods. She denies visual disturbances, leg swelling, numbness/tingling in her lower extremities, nausea, diarrhea, urinary frequency, chest pain, palpitations. No h/o MI or CVA. Pt has eaten breakfast this morning. Pt states that she has not visited an ophthalmologist in several years but states that she can try to schedule an appointment in October.   Patient Active Problem List   Diagnosis Date Noted   Hyponatremia 06/01/2013   Weakness 06/01/2013   Cervical disc disease 05/25/2012   Chronic pain syndrome 05/25/2012   Osteoarthritis 05/25/2012   DYSLIPIDEMIA 05/30/2007   HYPERTENSION, BENIGN ESSENTIAL 05/30/2007    ALLERGIC RHINITIS 05/30/2007   ASTEATOTIC ECZEMA 05/30/2007    Past Medical History:  Diagnosis Date   Degenerative disc disease    Diabetes mellitus without complication (HCC)    Hypertension    Pruritus     Past Surgical History:  Procedure Laterality Date   NECK SURGERY      Social History  Substance Use Topics   Smoking status: Never Smoker   Smokeless tobacco: Never Used   Alcohol use No    History reviewed. No pertinent family history.  Allergies  Allergen Reactions   Lisinopril     Medication list has been reviewed and updated.  Current Outpatient Prescriptions on File Prior to Visit  Medication Sig Dispense Refill   amLODipine (NORVASC) 5 MG tablet Take 5 mg by mouth daily.     No current facility-administered medications on file prior to visit.     Review of Systems  Eyes: Negative for blurred vision and double vision.  Cardiovascular: Negative for chest pain, palpitations and leg swelling.  Gastrointestinal: Negative for diarrhea and nausea.  Genitourinary: Negative for frequency.  Neurological: Negative for tingling.    Physical Examination: Physical Exam  Constitutional: She is oriented to person, place, and time. She appears well-developed and well-nourished. No distress.  HENT:  Head: Normocephalic and atraumatic.  Right Ear: External ear normal.  Left Ear: External ear normal.  Eyes: Conjunctivae and EOM are normal. Pupils are equal, round, and reactive to light.  Cardiovascular: Normal rate,  regular rhythm, normal heart sounds and normal pulses.  Exam reveals no gallop and no friction rub.   No murmur heard. Pulmonary/Chest: Effort normal and breath sounds normal. No respiratory distress. She has no wheezes. She has no rhonchi. She has no rales.  Musculoskeletal:  Normal foot exam  Neurological: She is alert and oriented to person, place, and time.  Skin: She is not diaphoretic.  Psychiatric: She has a normal mood and  affect. Her behavior is normal.    Assessment and Plan: TEKELA GARGUILO is a 82 y.o. female who is here today for cc of diabetic medicaiton, and anti-hypertensives. Will follow up with lab results. Advised with demonstration, using glucose monitor, with translator.   --advised alarming symptoms that would warrant an immediate return. --she voiced understanding.  Will fill for 6 months, but will advised sooner return pending labs. Type 2 diabetes mellitus with complication, without long-term current use of insulin (HCC) - Plan: glimepiride (AMARYL) 2 MG tablet, COMPLETE METABOLIC PANEL WITH GFR, Hemoglobin A1c  Essential hypertension - Plan: amLODipine (NORVASC) 5 MG tablet, COMPLETE METABOLIC PANEL WITH GFR  Trena Platt, PA-C [facility] 9/15/20179:47 AM  I personally performed the services described in this documentation, which was scribed in my presence. The recorded information has been reviewed and is accurate.

## 2016-09-03 LAB — HEMOGLOBIN A1C
HEMOGLOBIN A1C: 5.3 % (ref <5.7)
MEAN PLASMA GLUCOSE: 105 mg/dL

## 2016-10-26 ENCOUNTER — Ambulatory Visit (INDEPENDENT_AMBULATORY_CARE_PROVIDER_SITE_OTHER): Payer: Medicare Other | Admitting: Family Medicine

## 2016-10-26 ENCOUNTER — Other Ambulatory Visit: Payer: Self-pay

## 2016-10-26 VITALS — BP 130/70 | HR 75 | Temp 98.9°F | Resp 16 | Ht 59.0 in | Wt 116.4 lb

## 2016-10-26 DIAGNOSIS — E162 Hypoglycemia, unspecified: Secondary | ICD-10-CM

## 2016-10-26 DIAGNOSIS — E118 Type 2 diabetes mellitus with unspecified complications: Secondary | ICD-10-CM | POA: Diagnosis not present

## 2016-10-26 DIAGNOSIS — I10 Essential (primary) hypertension: Secondary | ICD-10-CM | POA: Diagnosis not present

## 2016-10-26 LAB — GLUCOSE, POCT (MANUAL RESULT ENTRY): POC Glucose: 71 mg/dl (ref 70–99)

## 2016-10-26 MED ORDER — GLIMEPIRIDE 2 MG PO TABS: 1.0000 mg | ORAL_TABLET | Freq: Every day | ORAL | 0 refills | Status: DC | PRN

## 2016-10-26 MED ORDER — GLIMEPIRIDE 2 MG PO TABS
1.0000 mg | ORAL_TABLET | Freq: Every day | ORAL | 0 refills | Status: DC
Start: 2016-10-26 — End: 2016-10-26

## 2016-10-26 MED ORDER — AMLODIPINE BESYLATE 5 MG PO TABS: 5.0000 mg | ORAL_TABLET | Freq: Every day | ORAL | 1 refills | Status: DC

## 2016-10-26 NOTE — Telephone Encounter (Signed)
Patient is requesting a refill for amaryl. Please advise  [phone]

## 2016-10-26 NOTE — Progress Notes (Signed)
Chief Complaint  Patient presents with  . Medication Refill    Glimepiride 2 mg    HPI  Diabetes Mellitus: Patient presents for follow up of diabetes.  She reports that her sugars are not checked at home. She does not know how to use the machine to check her sugars.   Symptoms: hypoglycemia . Symptoms have gradually worsened with dizziness and feeling weak when she gets hungry. Her husband reports that she might be taking 2 tabletes of the glimepiride a day.  She is insisting that her sugar is low.  Her readings are 120 after her meal.  She ate breakfast at 8am and her sugar is currently 71 and she feels hungry and weak. With the help of a translator she expressed that she was worried that her sugar was still too high.  Lab Results  Component Value Date   HGBA1C 5.3 09/02/2016   Lab Results  Component Value Date   CREATININE 0.85 09/02/2016     Hypertension: Patient here for follow-up of elevated blood pressure.  She took her blood pressure medication this morning.  She is exercising by and is adherent to low salt diet.  Blood pressure is well controlled at home.  Use of agents associated with hypertension: none. History of target organ damage: none. BP Readings from Last 3 Encounters:  10/26/16 130/70  10/16/13 122/72  06/02/13 122/68    Past Medical History:  Diagnosis Date  . Degenerative disc disease   . Diabetes mellitus without complication (HCC)   . Hypertension   . Pruritus     Current Outpatient Prescriptions  Medication Sig Dispense Refill  . amLODipine (NORVASC) 5 MG tablet Take 1 tablet (5 mg total) by mouth daily. 90 tablet 1  . glimepiride (AMARYL) 2 MG tablet Take 0.5 tablets (1 mg total) by mouth daily as needed (if sugars start to run greater than 200 on more than 2 meals). 15 tablet 0   No current facility-administered medications for this visit.     Allergies:  Allergies  Allergen Reactions  . Lisinopril     Past Surgical History:  Procedure  Laterality Date  . NECK SURGERY      Social History   Social History  . Marital status: Widowed    Spouse name: N/A  . Number of children: N/A  . Years of education: N/A   Social History Main Topics  . Smoking status: Never Smoker  . Smokeless tobacco: Never Used  . Alcohol use No  . Drug use: No  . Sexual activity: No   Other Topics Concern  . None   Social History Narrative  . None    Review of Systems  Constitutional: Negative for chills and fever.  Respiratory: Negative for cough and wheezing.   Cardiovascular: Negative for chest pain, palpitations and orthopnea.  Gastrointestinal: Negative for abdominal pain, nausea and vomiting.  Skin: Negative for itching and rash.  Neurological: Negative for dizziness and headaches.    Objective: Vitals:   10/26/16 1147 10/26/16 1234  BP: (!) 154/80 130/70  Pulse: 75   Resp: 16   Temp: 98.9 F (37.2 C)   TempSrc: Oral   SpO2: 98%   Weight: 116 lb 6.4 oz (52.8 kg)   Height: 4\' 11"  (1.499 m)     Physical Exam  Constitutional: She appears well-developed and well-nourished.  HENT:  Head: Normocephalic and atraumatic.  Eyes: Conjunctivae and EOM are normal.  Cardiovascular: Normal rate, regular rhythm and normal heart sounds.   No  murmur heard. Pulmonary/Chest: Effort normal and breath sounds normal. No respiratory distress. She has no wheezes.  Musculoskeletal: She exhibits no edema.    Assessment and Plan Fuller Songhong was seen today for medication refill.  Diagnoses and all orders for this visit:  Essential hypertension- improved after recheck and once patient achieved understanding of the medical plan Will continue current dose -     amLODipine (NORVASC) 5 MG tablet; Take 1 tablet (5 mg total) by mouth daily.  Type 2 diabetes mellitus with complication, without long-term current use of insulin (HCC)-  Overly controlled diabetes Discussed that there is a great risk of hypoglycemia -     POCT glucose (manual  entry)  Hypoglycemia Discussed that her goal for a1c is 8.0 Patient does not understand a1c Explained that the risk of morbidity was higher from hypoglycemia in the elderly Declined refilling the glimepiride As patient began having an attack agreed to patient getting 1mg  dose of glimepiride and to return to clinic if sugars start to remain above 200 postprandial.  -     Discontinue: glimepiride (AMARYL) 2 MG tablet; Take 0.5 tablets (1 mg total) by mouth daily before breakfast. Only take medication if blood glucose is greater than 200. -     glimepiride (AMARYL) 2 MG tablet; Take 0.5 tablets (1 mg total) by mouth daily as needed (if sugars start to run greater than 200 on more than 2 meals).     Mirca Yale A Rmani Kapusta

## 2016-10-26 NOTE — Patient Instructions (Addendum)
Fasting Sugars   Before any meals your sugar should be 90 to 120  After a meal  Check your sugars 1-2 hours later   As long as your sugars are 160 to 200 your sugars are FINE  Stop taking the medication Only take the medication if your sugars are greater than 200.   IF you received an x-ray today, you will receive an invoice from [facility]. Please contact [facility] at [phone] with questions or concerns regarding your invoice.   IF you received labwork today, you will receive an invoice from [facility]. Please contact [facility] at [phone] with questions or concerns regarding your invoice.   Our billing staff will not be able to assist you with questions regarding bills from these companies.  You will be contacted with the lab results as soon as they are available. The fastest way to get your results is to activate your My Chart account. Instructions are located on the last page of this paperwork. If you have not heard from us regarding the results in 2 weeks, please contact this office.    H? ???ng huy?t (Hypoglycemia) H? ???ng huy?t x?y ra khi l??ng glucose trong mu qu th?p. Glucose l m?t lo?i ???ng l ngu?n n?ng l??ng chnh cho c? th? qu v?. Nh?ng hoc-mon, ch?ng h?n nh? insulin v glucagon, ki?m sot l??ng glucose trong mu. Insulin lm gi?m l??ng glucose trong mu v glucagon lm t?ng l??ng glucose trong mu. C qu nhi?u insulin trong mu, ho?c khng ?n ?? th?c ?n ch?a ???ng, c th? gy ra h? ???ng huy?t. H? ???ng huy?t c th? x?y ra ? nh?ng ng??i b? ho?c khng b? ti?u ???ng. N c th? pht tri?n nhanh chng v c th? l m?t tnh hu?ng c?p c?u y [place].  NGUYN NHN   B? b?a ho?c dng b?a mu?n.  Khng ?n ?? carbohydrates trong b?a ?n.  Dng qu nhi?u thu?c tr? ti?u ???ng.  Khng ch?n th?i gian u?ng thu?c tr? ti?u ???ng ho?c cc li?u insulin cng v?i b?a ?n, b?a ?n nh?, v t?p th? d?c.  Bu?n nn v nn m?a.  M?t s?  lo?i thu?c c? th?.  Nh?ng b?nh n?ng, ch?ng h?n nh? vim gan, b?nh th?n, v m?t s? r?i lo?n nh?t ??nh v? ?n u?ng.  T?ng ho?t ??ng ho?c t?p th? d?c m khng ?n thm ho?c ?i?u ch?nh thu?c.  U?ng qu nhi?u r??u.  R?i lo?n th?n kinh ?nh h??ng ??n ch?c n?ng c? th? nh? nh?p tim, huy?t p, v tiu ha (b?nh th?n kinh th?c v?t).  M?t tnh tr?ng b?nh l trong ? cc c? d? dy ho?t ??ng khng bnh th??ng (li?t d? dy). Do ?, thu?c v th?c ?n c th? khng ???c dung n?p bnh th??ng.  Tr??ng h?p hi?m g?p l m?t kh?i u ? tuy?n t?y c th? t?o ra qu nhi?u insulin. TRI?U CH?NG   ?i.  ?? m? hi (v m? hi).  Thay ??i thn nhi?t.  Run r?y.  ?au ??u.  Lo u.  Chong vng.  D? b? kch thch.  Kh t?p trung.  Kh mi?ng.  ?au bu?t ho?c t ? bn tay ho?c bn chn.  Ng? khng yn gi?c ho?c ng? khng ngon.  Thay ??i l?i ni v s? ph?i h?p.  Thay ??i trong tnh tr?ng tm th?n.  ??ng kinh ho?c co gi?t ko di.  Thch gy g?.  Bu?n ng? (ng? l?m).  Y?u.  Nh?p tim t?ng nhanh ho?c ?nh tr?ng ng?c.  B? l l?n.  N??c da ti, xm.  Nhn m? ho?c nhn m?t thnh hai.  Ng?t x?u. CH?N ?ON  Th?c hi?n khm th?c th? v xem b?nh s?. Chuyn gia ch?m [place] s?c kh?e c th? ch?n ?on d?a trn cc tri?u ch?ng c?a qu v?. Xt nghi?m mu v cc xt nghi?m khc c th? ???c lm ?? xc nh?n ch?n ?on. Khi ? c ch?n ?on, chuyn gia ch?m [place] y t? s? xem li?u cc d?u hi?u v tri?u ch?ng c?a qu v? c h?t khng khi l??ng glucose trong mu t?ng ln.  ?I?U TR?  Thng th??ng qu v? c th? d? dng ?i?u tr? ch?ng h? ???ng huy?t khi qu v? th?y c tri?u ch?ng.  Ki?m tra l??ng glucose trong mu c?a qu v?. N?u d??i 70 mg/dl, hy dng m?t trong nh?ng th? sau:  3-4 vin glucose.   c?c n??c tri cy.   c?c s ?a thng th??ng.  1 c?c s?a tch bo.  -1 ?ng glucose gel.  5-6 ci k?o c?ng.  Trnh cc ?? u?ng nhi?u ch?t bo ho?c th?c ?n m c th? lm ch?m qu trnh t?ng l??ng glucose trong mu.  Khng  dng l??ng th?c ?n, ?? u?ng, gel, ho?c vin c ???ng nhi?u h?n ch? ??nh. Lm nh? v?y s? lm l??ng glucose trong mu c?a qu v? t?ng qu cao.  ??i 10-15 pht v ki?m tra l?i l??ng glucose trong mu. N?u l??ng glucose v?n th?p h?n 70 mg/dl ho?c d??i m?c m?c tiu, hy ?i?u tr? ti?p.  ?n m?t b?a ?n nh? n?u cn 1 ti?ng n?a m?i ??n b?a ?n ti?p theo c?a qu v?. S? c lc m l??ng glucose trong mu c?a qu v? c th? xu?ng th?p ??n m?c qu v? khng th? t? ?i?u tr? cho mnh ? nh khi qu v? b?t ??u nh?n th?y c tri?u ch?ng. Qu v? c th? c?n ai ? gip qu v?. Qu v? th?m ch c th? ng?t ho?c khng th? nu?t ???c. N?u qu v? khng th? t? ?i?u tr?, s? c?n c ng??i ??a qu v? ??n b?nh vi?n.  H??NG D?N CH?M [place] T?I NH  N?u qu v? b? ti?u ???ng, hy lm theo k? ho?ch qu?n l ti?u ???ng b?ng cch:  Dng thu?c theo ch? d?n.  Lm theo k? ho?ch t?p luy?n.  Lm theo k? ho?ch ?n u?ng. Khng b? b?a. ?n ?ng gi?.  Ki?m tra l??ng glucose trong mu th??ng xuyn. Ki?m tra l??ng glucose trong mu tr??c v sau khi t?p th? d?c. N?u vi?c t?p th? d?c ko di ho?c khc v?i bnh th??ng, hy b?o ??m ki?m tra l??ng glucose trong mu th??ng xuyn h?n.  ?eo ?? trang s?c c?nh bo y t? m c th? cho bi?t qu v? b? ti?u ???ng.  Xc ??nh nguyn nhn gy h? ???ng huy?t. Sau ?, ??a ra cc cch ?? trnh ti di?n ch?ng h? ???ng huy?t.  Khng t?m b?n ho?c vi sen n??c nng ngay sau khi tim insulin.  Lun mang theo thu?c ?i?u tr?. Cc vin glucose l d? mang theo nh?t.  N?u qu v? s? u?ng r??u, hy ch? u?ng r??u khi ?n.  Ni cho b?n b ho?c ng??i trong gia ?nh cc cch lm cho qu v? an ton trong lc b? ??ng kinh. Vi?c ny c th? bao g?m lo?i b? cc ?? v?t c?ng v s?c ra kh?i ch? c?a qu v? ho?c l?t qu v? n?m nghing.  Duy tr cn n?ng c l?i cho s?c  kh?e. ?I KHM N?U:   Qu v? g?p v?n ?? v?i vi?c gi? l??ng glucose trong mu ? ph?m vi m?c tiu.  Qu v? ?ang c nh?ng c?n h? ???ng huy?t th??ng xuyn.  Qu v? c?m th?y mnh  c th? ?ang c ph?n ?ng ph? v?i thu?c.  Qu v? khng bi?t ch?c v sao l??ng glucose trong mu c?a qu v? gi?m th?p nh? v?y.  Qu v? nh?n th?y thay ??i v? th? l?c ho?c m?t v?n ?? m?i v?i th? l?c c?a qu v?. NGAY L?P T?C ?I [facility] N?U:   B? l l?n.  Thay ??i trong tnh tr?ng tm th?n.  Khng th? nu?t.  B? ng?t.   Thng tin ny khng nh?m m?c ?ch thay th? cho l?i khuyn m chuyn gia ch?m [place] s?c kh?e ni v?i qu v?. Hy b?o ??m qu v? ph?i th?o lu?n b?t k? v?n ?? g m qu v? c v?i chuyn gia ch?m [place] s?c kh?e c?a qu v?.   Document Released: 12/07/2005 Document Revised: 12/12/2013 Elsevier Interactive Patient Education [employer]

## 2016-10-29 NOTE — Telephone Encounter (Signed)
10/26/16 last ov and lab Warning comes up when authorizing

## 2016-11-03 ENCOUNTER — Other Ambulatory Visit: Payer: Self-pay | Admitting: Family Medicine

## 2016-11-03 MED ORDER — GLIMEPIRIDE 1 MG PO TABS: 0.5000 mg | ORAL_TABLET | Freq: Every day | ORAL | 3 refills | Status: DC | PRN

## 2016-11-03 NOTE — Telephone Encounter (Signed)
Please let the pharmacy know that I have changed her dose.  I would like her to get the lowest dose possible.  For her to keep for when her sugars are above 200. At the next office visit I plan to stop this medication completely. Please check that she has a follow up appointment in one month.  Thank you.

## 2016-11-03 NOTE — Telephone Encounter (Signed)
Called pharm and advised that this is the new decreased dose of glimepiride and asked them to DC any remaining RFs of previous doses. Asked Corrie DandyMary to call pt to set up appt as I don't see one already scheduled.

## 2016-11-05 ENCOUNTER — Ambulatory Visit (INDEPENDENT_AMBULATORY_CARE_PROVIDER_SITE_OTHER): Payer: Medicare Other | Admitting: Family Medicine

## 2016-11-05 VITALS — BP 138/70 | HR 83 | Temp 99.8°F | Resp 16 | Ht 59.0 in | Wt 116.2 lb

## 2016-11-05 DIAGNOSIS — E162 Hypoglycemia, unspecified: Secondary | ICD-10-CM | POA: Diagnosis not present

## 2016-11-05 DIAGNOSIS — E118 Type 2 diabetes mellitus with unspecified complications: Secondary | ICD-10-CM | POA: Diagnosis not present

## 2016-11-05 LAB — GLUCOSE, POCT (MANUAL RESULT ENTRY): POC Glucose: 135 mg/dl — AB (ref 70–99)

## 2016-11-05 LAB — POCT GLYCOSYLATED HEMOGLOBIN (HGB A1C): Hemoglobin A1C: 5.9

## 2016-11-05 NOTE — Progress Notes (Signed)
Chief Complaint  Patient presents with  . Hypertension  . Diabetes    HPI Patient here to follow up for her diabetes  Using Interpreter service She reports that she feels dizzy when she does not get enough medications She reports that when the sugars are getting higher she feels achy in her head She reports that she checks her sugars and gets readings of 280-310 occasionally one hour after eating rice.   Before the meal her sugars are 120s.  She reports that her last meal was noon.  She ate pizza and bread for lunch. She took her medication this morning and took a half dose as prescribed.  Lab Results  Component Value Date   HGBA1C 5.9 11/05/2016    Wt Readings from Last 3 Encounters:  11/05/16 116 lb 3.2 oz (52.7 kg)  10/26/16 116 lb 6.4 oz (52.8 kg)  10/16/13 106 lb (48.1 kg)      Past Medical History:  Diagnosis Date  . Degenerative disc disease   . Diabetes mellitus without complication (HCC)   . Hypertension   . Pruritus     Current Outpatient Prescriptions  Medication Sig Dispense Refill  . amLODipine (NORVASC) 5 MG tablet Take 1 tablet (5 mg total) by mouth daily. 90 tablet 1  . glimepiride (AMARYL) 1 MG tablet Take 0.5 tablets (0.5 mg total) by mouth daily as needed (if sugars start to run greater than 200 on more than 2 meals). 30 tablet 3  . FLUZONE HIGH-DOSE 0.5 ML SUSY      No current facility-administered medications for this visit.     Allergies:  Allergies  Allergen Reactions  . Lisinopril     Past Surgical History:  Procedure Laterality Date  . NECK SURGERY      Social History   Social History  . Marital status: Widowed    Spouse name: N/A  . Number of children: N/A  . Years of education: N/A   Social History Main Topics  . Smoking status: Never Smoker  . Smokeless tobacco: Never Used  . Alcohol use No  . Drug use: No  . Sexual activity: No   Other Topics Concern  . None   Social History Narrative  . None     ROS  Objective: Vitals:   11/05/16 1354  BP: 138/70  Pulse: 83  Resp: 16  Temp: 99.8 F (37.7 C)  TempSrc: Oral  SpO2: 97%  Weight: 116 lb 3.2 oz (52.7 kg)  Height: 4\' 11"  (1.499 m)    Physical Exam  Assessment and Plan Fuller Songhong was seen today for hypertension and diabetes.  Diagnoses and all orders for this visit:  Type 2 diabetes mellitus with complication, without long-term current use of insulin (HCC)-  Well controlled Will continue to keep amaryl at the lowest possible dose Even with a translator patient has a difficult time understanding why her dose was decreased -     POCT glucose (manual entry) -     POCT glycosylated hemoglobin (Hb A1C) -     Ambulatory referral to Home Health  Hypoglycemia- previous hypoglycemia improved  -     POCT glycosylated hemoglobin (Hb A1C) -     Ambulatory referral to Home Health  Instructed that her a1c shows that she is well within goal of a1c<8% She is instructed to continue amaryl 0.5mg  but if her sugars remain in the 200s to 300s she should increase to 1mg  Referral placed for home health to check her sugars at home Pt  needs much insurance to accept that she does not need as much medication for her diabetes She is finally understanding that her numbers are good.   A total of 30 minutes were spent face-to-face with the patient during this encounter and over half of that time was spent on counseling and coordination of care.   Zoe A Stallings

## 2016-11-05 NOTE — Patient Instructions (Addendum)
U?ng 1/2 vin m?i ngy tr? khi ???ng c?a b?n trn 300 n?u n ? trn 300 vo ngy hm sau u?ng 1 vin n?u b?n ???ng t h?n 120 sau ? u?ng m?t t s?a ?? ng?n ???ng th?p  y t ch?m [place] t?i nh c?ng s? ki?m tra ???ng  tr? l?i phng khm trong m?t thng    IF you received an x-ray today, you will receive an invoice from [facility]. Please contact [facility] at [phone] with questions or concerns regarding your invoice.   IF you received labwork today, you will receive an invoice from [facility]. Please contact [facility] at [phone] with questions or concerns regarding your invoice.   Our billing staff will not be able to assist you with questions regarding bills from these companies.  You will be contacted with the lab results as soon as they are available. The fastest way to get your results is to activate your My Chart account. Instructions are located on the last page of this paperwork. If you have not heard from us regarding the results in 2 weeks, please contact this office.     H? ???ng huy?t (Hypoglycemia) Ha? ????ng huy?t xa?y ra khi l???ng ????ng (glucose) trong ma?u qua? th?p. Glucose l m?t lo?i ???ng cung c?p ngu?n n?ng l??ng chnh cho c? th? qu v?. Nh?ng hoc-mn nh?t ?i?nh (insulin va? glucagon) ki?m sot l??ng glucose trong mu. Insulin lm gi?m l??ng glucose trong mu v glucagon lm t?ng l??ng glucose trong mu. H? ???ng huy?t c th? x?y ra do c qu nhi?u insulin trong mu, ho?c do khng ?n ?? th?c ?n ch?a glucose. H? ???ng huy?t c th? x?y ra ? nh?ng ng??i b? ho?c khng b? ti?u ???ng. N c th? pht tri?n nhanh chng v c th? l m?t tnh hu?ng c?p c?u. NGUYN NHN H? ???ng huy?t x?y ra th??ng xuyn nh?t ? nh?ng ng??i b? ti?u ???ng. N?u qu v? b? ti?u ???ng, h? ???ng huy?t c th? x?y ra do:  Thu?c tr? ti?u ???ng.  Khng ?n ??, ho?c khng ?n ?? th??ng xuyn.  T?ng ho?t ??ng thn th?.  U?ng r??u, ??c bi?t khi qu  v? ch?a ?n g. N?u qu v? khng b? ti?u ???ng, h? ???ng huy?t c th? x?y ra do:  M?t kh?i u ? t?y. T?y l c? quan t?o ra insulin.  Khng ?n ?u?, ho??c khng ?n trong m?t th??i gian da?i (nhi?n ?o?i).  Nhi?m trng n?ng ho?c b? ?m ?nh h??ng ??n gan, tim ho?c th?n.  M?t s? lo?i thu?c nh?t ??nh. Qu v? c?ng c th? b? h? ???ng huy?t ph?n ?ng. Tnh tr?ng ny gy h? ???ng huy?t trong vng 4 gi? sau khi ?n. Tnh tr?ng ny c th? x?y ra sau ph?u thu?t d? dy. ?i khi, nguyn nhn gy h? ???ng huy?t ph?n ?ng l khng r. CC Y?U T? NGUY C? Ha? ????ng huy?t hay xa?y ra h?n ??:  Nh??ng ng???i b? b?nh ti?u ???ng v dng thu?c ?? gi?m glucose trong mu.  Nh??ng ng??i l?m d?ng r??u.  Nh?ng ng??i b? ?m n?ng. TRI?U CH?NG H? ???ng huy?t c th? khng gy tri?u ch?ng no. N?u qu v? c tri?u ch?ng, cc tri?u ch?ng ? c th? bao g?m:  ?i.  Lo u.  ?? m? hi v c?m th?y ?m ??t.  L l?n.  Chng m?t ho?c c?m th?y chong vng.  Bu?n ng?.  Bu?n nn.  Nh?p tim t?ng.  ?au ??u.  Nhn m?.  ??ng kinh.  c m?ng.  ?au nhi ho?c t ? quanh mi?ng, mi ho?c l??i.  Thay ??i l??i no?i.  Gi?m kh? n?ng t?p trung.  Thay ??i s?? ph?i h??p.  Ng? khng yn.  Run ho?c l?c.  Ng?t x?u.  D? b? kch thch. CH?N ?ON H? ???ng huy?t ???c ch?n ?on nh? xt nghi?m mu ?o l??ng glucose trong mu c?a qu v?. Xt nghi?m mu ny ???c th?c hi?n trong khi qu v? ?ang c tri?u ch?ng. Chuyn gia ch?m [place] s?c kh?e c?a qu v? c?ng c th? khm th?c th? v khai thc b?nh s? c?a qu v?. N?u qu v? khng b? ti?u ???ng, cc xt nghi?m khc c th? ???c lm ?? tm nguyn nhn gy h? ???ng huy?t. ?I?U TR? Tnh tr?ng ny th??ng c th? ???c ?i?u tr? b?ng cch ?n ho?c u?ng ngay th? g ? ch?a glucose, ch?ng h?n nh?:  3-4 vin ???ng (vin glucose).  Glucose d?ng gel, ?ng 15 gam.  N??c p tri cy, 4 ao-x? (120 mL).  Soda th???ng (khng pha?i loa?i cho ng???i ?n king), 4 ao-x? (120 mL).  S?a t bo, 4 ao-x?  (120 mL).  Vi vin k?o c?ng.  ???ng ho?c m?t ong, 1 tha c ph. ?i?u tr? h? ???ng huy?t n?u qu v? b? ti?u t??ng  N?u qu v? t?nh to v c th? nu?t m?t cch an ton, hy theo quy t?c 15:15:  Dng 15 gam carbohydrate c tc d?ng nhanh. Cc l?a ch?n tc d?ng nhanh bao g?m:  1 ?ng glucose gel.  3 vin ???ng glucose.  6-8 vin k?o c?ng.  4 ao-x? (120 mL) n??c p tri cy.  4 ao-x? (120 mL) soda th???ng (khng pha?i loa?i cho ng???i ?n king).  Ki?m tra ???ng huy?t c?a qu v? sau khi du?ng carbohydrate 15 pht.  N?u l??ng ???ng huy?t ?o l?i v?n ? m?c 70 mg/dL (3,9 mmol/L) tr? xu?ng, ha?y la?i dng ti?p t? 15 gam carbohydrate.  N?u l??ng ???ng huy?t cu?a quy? vi? khng t?ng qu 70 mg/dL (3,9 mmol/L) sau 3 l?n th??, quy? vi? c?n ???c ch?m so?c y t? kh?n c?p.  Sau khi n?ng ?? glucose trong ma?u tr? l?i bnh th??ng, ha?y ?n m?t b?a ?n chi?nh ho?c m?t b??a ?n nh? trong vng 1 ti?ng. ?i?u tr? h? ???ng huy?t nghim tr?ng  H? ???ng huy?t nghim tr?ng l khi l??ng ???ng huy?t c?a qu v? t? 54 mg/dL (3 mmol/L) tr? xu?ng. H? ???ng huy?t nghim tr?ng l m?t tnh hu?ng c?p c?u. Khng ch? xem tri?u ch?ng c h?t khng. Hy ?i khm ngay l?p t?c. G?i cho d?ch v? c?p c?u t?i ??a ph??ng (911 ? [place] Khng t? li xe ??n b?nh vi?n. N?u qu v? b? h? ???ng huy?t nghim tr?ng v qu v? khng th? ?n ho?c u?ng, qu v? c th? c?n tim glucagon. M?t ng??i trong gia ?nh ho?c b?n thn c?n h?c cch ki?m tra ???ng huy?t cho qu v? v cch tim glucagon cho qu v?. Hy h?i chuyn gia ch?m [place] s?c kh?e xem qu v? c c?n c s?n m?t b? ?? tim glucagon kh?n c?p khng. H? ???ng huy?t nghim tr?ng c th? c?n ???c ?i?u tr? trong b?nh vi?n. Vi?c ?i?u tr? c th? bao g?m truy?n glucose qua ???ng truy?n t?nh m?ch (IV). Qu v? c?ng c th? c?n ?i?u tr? nguyn nhn gy h? ???ng huy?t. H??NG D?N CH?M  T?I NH H??ng d?n chung   Trnh b?t k? ch? ?? ?n no lm cho qu v?  khng ?n ?? th?c ?n. Ni v?i chuyn gia ch?m [place]  s?c kh?e tr??c khi qu v? b?t ??u b?t k? ch? ?? ?n m?i no.  Ch? s? d?ng thu?c khng c?n k ??n v thu?c c?n k ??n theo ch? d?n c?a chuyn gia ch?m [place] s?c kh?e.  Gi?i h?n l??ng r??u qu v? u?ng khng qu 1 ly m?i ngy v?i ph? n? khng mang thai v 2 ly m?i ngy v?i nam gi?i. M?t ly t??ng ???ng v?i 12 ao-x? bia, 5 ao-x? r??u vang, ho?c 1 ao-x? r??u m?nh.  Tun th? t?t c? cc cu?c h?n khm l?i theo ch? d?n c?a chuyn gia ch?m [place] s?c kh?e. ?i?u ny c vai tr quan tr?ng. N?u qu v? b? ti?u ???ng:   B?o ??m qu v? bi?t cc tri?u ch?ng c?a h? ???ng huy?t.  Lun mang theo ?? ?n nhanh ch?a carbohydrate tc d?ng nhanh ?? ?i?u tr? h? ???ng huy?t.  Lm theo k? ho?ch qu?n l ???ng huy?t c?a qu v?, nh? ch? d?n c?a chuyn gia ch?m [place] s?c kh?e. ??m b?o qu v?:  Dng thu?c theo ch? d?n.  Lm theo k? ho?ch t?p luy?n.  Lm theo k? ho?ch ?n u?ng. ?n ?ng gi? khng b? b?a.  Ki?m tra ????ng huy?t cu?a quy? vi? th???ng xuyn theo chi? d?n. Hy ch?c ch?n vi?c ki?m tra l??ng ???ng huy?t tr??c v sau khi t?p th? d?c. N?u qu v? t?p th? d?c ko di h?n ho?c khc v?i bnh th??ng, hy ki?m tra l??ng ???ng huy?t th??ng xuyn h?n.  Lm theo k? ho?ch trong ngy b? b?nh c?a qu v? b?t c? lc no m qu v? khng th? ?n ho?c u?ng ???c bnh th??ng. L?p k? ho?ch ny tr??c v?i chuyn gia ch?m [place] s?c kh?e.  Chia s? k? ho?ch qu?n l b?nh ti?u ???ng c?a qu v? ? n?i lm vi?c, tr??ng h?c va? gia ?i?nh quy? vi?.  Ki?m tra [place] trong n??c ti?u khi qu v? b? b?nh v theo ch? d?n c?a chuyn gia ch?m [place] s?c kh?e.  Mang theo th? c?nh bo y t? ho?c ?eo ?? trang s?c c c?nh bo y t?. N?u qu v? b? h? ???ng huy?t ph?n ?ng ho?c c l??ng ???ng huy?t th?p do nh?ng nguyn nhn khc:   Hy theo di l??ng glucose trong mu c?a qu v? theo ch? d?n c?a chuyn gia ch?m [place] s?c kh?e.  Tun th? ch? ??n c?a chuyn gia ch?m [place] s?c kh?e v? cc h?n ch? ?n ho?c u?ng. ?I KHM N?U:  Qu v? g?p v?n ?? v?i vi?c gi? l??ng glucose  trong mu ? ph?m vi m?c tiu.  Qu v? c nh?ng c?n h? ???ng huy?t th??ng xuyn. NGAY L?P T?C ?I KHM N?U:  Qu v? ti?p t?c c cc tri?u ch?ng h? ???ng huy?t sau khi ?n ho?c u?ng ?? g ? c ch?a ???ng.  L???ng ????ng huy?t cu?a quy? vi? l 54 mg/dL (3 mmol/L) ho?c th?p h?n.  Qu v? b? m?t c?n ??ng kinh.  Qu v? b? ng?t. Nh?ng tri?u ch?ng ny c th? l m?t v?n ?? nghim tr?ng c?n c?p c?u. Khng ch? xem tri?u ch?ng c h?t khng. Hy ?i khm ngay l?p t?c. G?i cho d?ch v? c?p c?u t?i ??a ph??ng (911 ? [place]?). Khng t? li xe ??n b?nh vi?n.  Thng tin ny khng nh?m m?c ?ch thay th? cho l?i khuyn m chuyn gia ch?m [place] s?c kh?e ni v?i qu v?. Hy b?o ??m qu v? ph?i th?o  lu?n b?t k? v?n ?? g m qu v? c v?i chuyn gia ch?m [place] s?c kh?e c?a qu v?. Document Released: 12/07/2005 Document Revised: 03/30/2016 Document Reviewed: 01/10/2016 Elsevier Interactive Patient Education  2017 [employer].

## 2016-12-02 ENCOUNTER — Ambulatory Visit: Payer: Medicare Other | Admitting: Family Medicine

## 2016-12-26 ENCOUNTER — Telehealth: Payer: Self-pay

## 2016-12-26 MED ORDER — GLIMEPIRIDE 1 MG PO TABS: 1.0000 mg | ORAL_TABLET | Freq: Every day | ORAL | 1 refills | Status: DC

## 2016-12-26 NOTE — Telephone Encounter (Signed)
Pt states she is taking 1  1mg  glimepiride per day (not 1/2) Ok to change and send [facility] [place]?

## 2016-12-26 NOTE — Telephone Encounter (Signed)
Meds ordered this encounter  Medications  . glimepiride (AMARYL) 1 MG tablet    Sig: Take 1 tablet (1 mg total) by mouth daily with breakfast.    Dispense:  30 tablet    Refill:  1    Order Specific Question:   Supervising Provider    Answer:   Clelia CroftSHAW, EVA N [[id number]]    Follow-up with Dr. Creta LevinStallings per her last note.

## 2017-02-03 ENCOUNTER — Ambulatory Visit: Payer: Self-pay

## 2017-02-05 ENCOUNTER — Ambulatory Visit (INDEPENDENT_AMBULATORY_CARE_PROVIDER_SITE_OTHER): Payer: Medicare Other

## 2017-02-05 ENCOUNTER — Ambulatory Visit (INDEPENDENT_AMBULATORY_CARE_PROVIDER_SITE_OTHER): Payer: Medicare Other | Admitting: Physician Assistant

## 2017-02-05 ENCOUNTER — Emergency Department (HOSPITAL_COMMUNITY): Payer: Medicare Other

## 2017-02-05 ENCOUNTER — Emergency Department (HOSPITAL_COMMUNITY)
Admission: EM | Admit: 2017-02-05 | Discharge: 2017-02-05 | Disposition: A | Discharge status: Home / Self Care | Payer: Medicare Other | Attending: Emergency Medicine | Admitting: Emergency Medicine

## 2017-02-05 ENCOUNTER — Encounter (HOSPITAL_COMMUNITY): Payer: Self-pay

## 2017-02-05 VITALS — BP 132/62 | HR 80 | Temp 98.9°F | Resp 16 | Ht 59.0 in | Wt 113.0 lb

## 2017-02-05 DIAGNOSIS — Y999 Unspecified external cause status: Secondary | ICD-10-CM | POA: Insufficient documentation

## 2017-02-05 DIAGNOSIS — S0990XA Unspecified injury of head, initial encounter: Secondary | ICD-10-CM | POA: Diagnosis not present

## 2017-02-05 DIAGNOSIS — S99922A Unspecified injury of left foot, initial encounter: Secondary | ICD-10-CM

## 2017-02-05 DIAGNOSIS — Y939 Activity, unspecified: Secondary | ICD-10-CM | POA: Diagnosis not present

## 2017-02-05 DIAGNOSIS — S82832A Other fracture of upper and lower end of left fibula, initial encounter for closed fracture: Secondary | ICD-10-CM

## 2017-02-05 DIAGNOSIS — S82432A Displaced oblique fracture of shaft of left fibula, initial encounter for closed fracture: Secondary | ICD-10-CM | POA: Diagnosis not present

## 2017-02-05 DIAGNOSIS — Y929 Unspecified place or not applicable: Secondary | ICD-10-CM | POA: Diagnosis not present

## 2017-02-05 DIAGNOSIS — W19XXXA Unspecified fall, initial encounter: Secondary | ICD-10-CM

## 2017-02-05 DIAGNOSIS — E119 Type 2 diabetes mellitus without complications: Secondary | ICD-10-CM

## 2017-02-05 DIAGNOSIS — Z7984 Long term (current) use of oral hypoglycemic drugs: Secondary | ICD-10-CM | POA: Diagnosis not present

## 2017-02-05 DIAGNOSIS — R51 Headache: Secondary | ICD-10-CM | POA: Diagnosis not present

## 2017-02-05 DIAGNOSIS — I10 Essential (primary) hypertension: Secondary | ICD-10-CM | POA: Insufficient documentation

## 2017-02-05 DIAGNOSIS — Z79899 Other long term (current) drug therapy: Secondary | ICD-10-CM | POA: Diagnosis not present

## 2017-02-05 DIAGNOSIS — E041 Nontoxic single thyroid nodule: Secondary | ICD-10-CM | POA: Diagnosis not present

## 2017-02-05 DIAGNOSIS — S99912A Unspecified injury of left ankle, initial encounter: Secondary | ICD-10-CM | POA: Diagnosis present

## 2017-02-05 DIAGNOSIS — S199XXA Unspecified injury of neck, initial encounter: Secondary | ICD-10-CM | POA: Diagnosis not present

## 2017-02-05 DIAGNOSIS — W01198A Fall on same level from slipping, tripping and stumbling with subsequent striking against other object, initial encounter: Secondary | ICD-10-CM | POA: Insufficient documentation

## 2017-02-05 MED ORDER — AMLODIPINE BESYLATE 5 MG PO TABS: 5.0000 mg | ORAL_TABLET | Freq: Every day | ORAL | 0 refills | Status: DC

## 2017-02-05 MED ORDER — GLIMEPIRIDE 1 MG PO TABS: 1.0000 mg | ORAL_TABLET | Freq: Every day | ORAL | 5 refills | Status: DC

## 2017-02-05 MED ORDER — AMLODIPINE BESYLATE 5 MG PO TABS: 5.0000 mg | ORAL_TABLET | Freq: Every day | ORAL | 1 refills | Status: DC

## 2017-02-05 MED ORDER — GLIMEPIRIDE 1 MG PO TABS: 1.0000 mg | ORAL_TABLET | Freq: Every day | ORAL | 0 refills | Status: DC

## 2017-02-05 NOTE — ED Notes (Signed)
Discharge instructions, follow-up care and prescriptions reviewed through phone interpreter with PA at bedside. Pt verbalized understanding.

## 2017-02-05 NOTE — ED Triage Notes (Signed)
Patient slipped on water 8 days ago and is still having left foot pain and a headache from fall. Patient went to [facility] and was told to come to the ED for further treatment.

## 2017-02-05 NOTE — Discharge Instructions (Signed)
Please wear the splint. Rest, ice, elevate her left leg. Motrin and Tylenol for pain. You need to call on Monday for an appointment with the orthopaedist. You also need to see your primary care doctor to have your thyroid checked with ultrasound.   Hy m?c dy n?p. Ngh? ngoi, bang, nng chn tri ln. Motrin v Tylenol v dau. Qu v? c?n g?i vo th? 2 d? l?y h?n v?i bc si ch?nh hnh. B?n cung c?n g?p bc si cham [place] chnh d? ki?m tra tuy?n gip b?ng siu m.

## 2017-02-05 NOTE — Patient Instructions (Addendum)
  You must go to [facility] for the foot care and for your head.  I will refill the medications, but you must make sure you are going to the hospital at this time.     IF you received an x-ray today, you will receive an invoice from [facility]. Please contact [facility] at [phone] with questions or concerns regarding your invoice.   IF you received labwork today, you will receive an invoice from [facility]. Please contact [facility] at [phone] with questions or concerns regarding your invoice.   Our billing staff will not be able to assist you with questions regarding bills from these companies.  You will be contacted with the lab results as soon as they are available. The fastest way to get your results is to activate your My Chart account. Instructions are located on the last page of this paperwork. If you have not heard from [place] regarding the results in 2 weeks, please contact this office.

## 2017-02-05 NOTE — Progress Notes (Signed)
[facility] [address] [place] [address] [phone]  Date:  02/05/2017   Name:  Robin Andrews   DOB:  1931/09/30   MRN:  [id number]  PCP:  No primary care provider on file.    History of Present Illness:  Robin Andrews is a 83 y.o. female patient who presents to [facility] for cc of head injury, ankle pain, refill of medications.    Patient is here today with complaint of head pain and dizziness following a fall obtained 8 days ago.  Patient fell, hitting her forehead.  Reports loc for 10 minutes.  She did not go to the ED and EMS was not contacted.  She reports feeling pain at her foot as well though mechanism of injury is not known.  She denies any nausea to follow.  She states she is off balance and has to hold onto things though she reports this is due to the pain of her left foot.  Difficulty to ambulate.  She noticed swelling.   Several questions regarding symptoms inquired by tele translator but she does not answer the question, but states she needs refill of medication despite prompting.  No chest pains, palpitations, sob, or dyspnea.  No dizziness prior to her fall.      She checks her blood sugar at home.  And well controlled.  No tremulousness.    Chief Complaint  Patient presents with  . Diabetes    Labs  . Hypertension    refill amlodipine  . Leg Pain    Left leg, states it "gives" and causes her to fall 1 week ago   . Head Injury    Hit head during fall 1 week ago      Patient Active Problem List   Diagnosis Date Noted  . Hyponatremia 06/01/2013  . Weakness 06/01/2013  . Cervical disc disease 05/25/2012  . Chronic pain syndrome 05/25/2012  . Osteoarthritis 05/25/2012  . DYSLIPIDEMIA 05/30/2007  . HYPERTENSION, BENIGN ESSENTIAL 05/30/2007  . ALLERGIC RHINITIS 05/30/2007  . ASTEATOTIC ECZEMA 05/30/2007    Past Medical History:  Diagnosis Date  . Degenerative disc disease   . Diabetes mellitus without complication ([place])   .  Hypertension   . Pruritus     Past Surgical History:  Procedure Laterality Date  . NECK SURGERY      Social History  Substance Use Topics  . Smoking status: Never Smoker  . Smokeless tobacco: Never Used  . Alcohol use No    No family history on file.  Allergies  Allergen Reactions  . Lisinopril     Medication list has been reviewed and updated.  Current Outpatient Prescriptions on File Prior to Visit  Medication Sig Dispense Refill  . amLODipine (NORVASC) 5 MG tablet Take 1 tablet (5 mg total) by mouth daily. 90 tablet 1  . glimepiride (AMARYL) 1 MG tablet Take 1 tablet (1 mg total) by mouth daily with breakfast. 30 tablet 1  . FLUZONE HIGH-DOSE 0.5 ML SUSY      No current facility-administered medications on file prior to visit.     ROS ROS otherwise unremarkable unless listed above.   Physical Examination: BP 132/62   Pulse 80   Temp 98.9 F (37.2 C) (Oral)   Resp 16   Ht 4' 11"  (1.499 m)   Wt 113 lb (51.3 kg)   SpO2 96%   BMI 22.82 kg/m  Ideal Body Weight: Weight in (lb) to have BMI = 25:  123.5  Physical Exam  Constitutional: She is oriented to person, place, and time. She appears well-developed and well-nourished. No distress.  HENT:  Head: Normocephalic and atraumatic.  Right Ear: External ear normal.  Left Ear: External ear normal.  Eyes: Conjunctivae and EOM are normal. Pupils are equal, round, and reactive to light.  Cardiovascular: Normal rate.   Pulmonary/Chest: Effort normal. No respiratory distress.  Neurological: She is alert and oriented to person, place, and time. No cranial nerve deficit. Gait (antalgic gait) abnormal. Coordination normal.  f to n normal.  Nl ocular following  Skin: She is not diaphoretic.  Psychiatric: She has a normal mood and affect. Her behavior is normal.    Results for orders placed or performed in visit on 11/05/16  POCT glucose (manual entry)  Result Value Ref Range   POC Glucose 135 (A) 70 - 99 mg/dl  POCT  glycosylated hemoglobin (Hb A1C)  Result Value Ref Range   Hemoglobin A1C 5.9    Dg Ankle Complete Left  Result Date: 02/05/2017 CLINICAL DATA:  Left ankle pain after fall. EXAM: LEFT ANKLE COMPLETE - 3+ VIEW COMPARISON:  None. FINDINGS: Mildly displaced oblique fracture is seen involving the distal left fibula. Joint spaces are intact. Vascular calcifications are noted. IMPRESSION: Mildly displaced distal left fibular fracture. Electronically Signed   By: Marijo Conception, M.D.   On: 02/05/2017 11:10   Dg Foot Complete Left  Result Date: 02/05/2017 CLINICAL DATA:  Left foot pain after fall. EXAM: LEFT FOOT - COMPLETE 3+ VIEW COMPARISON:  None. FINDINGS: Mildly displaced oblique fracture is seen involving the distal fibula. No other bony abnormality is noted. Joint spaces are intact. Vascular calcifications are noted. IMPRESSION: Mildly displaced distal fibular fracture. Electronically Signed   By: Marijo Conception, M.D.   On: 02/05/2017 11:09     Assessment and Plan: LYANNA BLYSTONE is a 83 y.o. female who is here today  Concern for possible slow hemorrhage.  She will likely need a CT.  She will also need visit for ortho for possible fixation.  With dizziness, age, as active as she is, crutches and boot may be cumbersome and problematic of another fall.  I contacted translator and advised that she must go to ED for evaluation with CT and ortho consult.  Attempted to do outpatient, but with language barrier/lack of transportation will need to proceed to ED.  They will self-transport with friend.   Type 2 diabetes mellitus without complication, without long-term current use of insulin (HCC) - Plan: CMP14+EGFR, Hemoglobin A1c  Essential hypertension - Plan: CMP14+EGFR, DISCONTINUED: amLODipine (NORVASC) 5 MG tablet  Injury of head, initial encounter - Plan: CBC  Injury of left foot, initial encounter - Plan: DG Foot Complete Left, DG Ankle Complete Left  Ivar Drape, PA-C [facility] 2/17/201810:07 AM

## 2017-02-05 NOTE — ED Notes (Signed)
Patient requriing [place] translator. Video translator in use at this time. Will triage when available

## 2017-02-05 NOTE — ED Provider Notes (Signed)
[facility]-EMERGENCY DEPT Provider Note   CSN: [id number] Arrival date & time: 02/05/17  1253     History   Chief Complaint Chief Complaint  Patient presents with  . Foot Injury  . Head Injury  . Fall    HPI Robin Andrews is a 83 y.o. female.  HPI 83 year old [place] female that presents to the ED today with complaints of left ankle pain in hip pain after sustaining a mechanical fall 8 days ago. The patient states that she slipped on water outside of a building downtown and twisted her left foot and hit her head on the ground. Patient went to [facility] today and was sent patient to the ED for further evaluation. She complains of mild headache but is more concerned about her ankle pain. Patient complains of left ankle pain. She has been ambulatory but with a limp. She endorses bruising and swelling to the area. Patient denies LOC. She denies any fever, chills,  vision changes, lightheadedness, dizziness, neck pain, back pain. Patient also states that she would her blood pressure and diabetes medicine refill. States that [facility] to the ED for evaluation and for [place] to fill her medicine. Denies any chest pain or shortness of breath. Denies being on any blood thinners.  Past Medical History:  Diagnosis Date  . Degenerative disc disease   . Diabetes mellitus without complication (HCC)   . Hypertension   . Pruritus     Patient Active Problem List   Diagnosis Date Noted  . Hyponatremia 06/01/2013  . Weakness 06/01/2013  . Cervical disc disease 05/25/2012  . Chronic pain syndrome 05/25/2012  . Osteoarthritis 05/25/2012  . DYSLIPIDEMIA 05/30/2007  . HYPERTENSION, BENIGN ESSENTIAL 05/30/2007  . ALLERGIC RHINITIS 05/30/2007  . ASTEATOTIC ECZEMA 05/30/2007    Past Surgical History:  Procedure Laterality Date  . DILATION AND CURETTAGE, DIAGNOSTIC / THERAPEUTIC    . NECK SURGERY      OB History    No data available       Home Medications    Prior to Admission  medications   Medication Sig Start Date End Date Taking? Authorizing Provider  amLODipine (NORVASC) 5 MG tablet Take 1 tablet (5 mg total) by mouth daily. 10/26/16   Doristine Bosworth, MD  FLUZONE HIGH-DOSE 0.5 ML SUSY  09/07/16   Historical Provider, MD  glimepiride (AMARYL) 1 MG tablet Take 1 tablet (1 mg total) by mouth daily with breakfast. 12/26/16   Porfirio Oar, PA-C    Family History No family history on file.  Social History Social History  Substance Use Topics  . Smoking status: Never Smoker  . Smokeless tobacco: Never Used  . Alcohol use No     Allergies   Lisinopril   Review of Systems Review of Systems  Constitutional: Negative for chills and fever.  HENT: Negative for congestion.   Eyes: Negative for photophobia and visual disturbance.  Respiratory: Negative for cough and shortness of breath.   Cardiovascular: Negative for chest pain and palpitations.  Gastrointestinal: Negative for abdominal pain, diarrhea, nausea and vomiting.  Musculoskeletal: Positive for arthralgias, gait problem and joint swelling. Negative for back pain, neck pain and neck stiffness.  Neurological: Positive for headaches. Negative for dizziness, syncope, weakness and light-headedness.  All other systems reviewed and are negative.    Physical Exam Updated Vital Signs BP 168/61   Pulse 80   Temp 98.7 F (37.1 C) (Oral)   Resp 18   SpO2 93%   Physical Exam  Constitutional: She is oriented to person, place, and time. She appears well-developed and well-nourished. No distress.  HENT:  Head: Normocephalic and atraumatic.  Eyes: Conjunctivae and EOM are normal. Pupils are equal, round, and reactive to light.  Neck: Normal range of motion. Neck supple.  No midline C-spine tenderness. No deformity or step-offs noted.  Abdominal: Soft. Bowel sounds are normal. She exhibits no distension. There is no rebound and no guarding.  Musculoskeletal:       Left ankle: She exhibits swelling and  ecchymosis. She exhibits normal range of motion, no deformity, no laceration and normal pulse. Tenderness. Lateral malleolus tenderness found. No medial malleolus, no posterior TFL, no head of 5th metatarsal and no proximal fibula tenderness found.  Strength 5/5 in lower extremities. DP pulses are 2+ bilaterally. Sensation intact. Mild edema and ecchymosis. No erythema or warmth.  Lymphadenopathy:    She has no cervical adenopathy.  Neurological: She is alert and oriented to person, place, and time.  The patient is alert, attentive, and oriented x 3. Speech is clear. Cranial nerve II-VII grossly intact. Negative pronator drift. Sensation intact. Strength 5/5 in all extremities. Reflexes 2+ and symmetric at biceps, triceps, knees, and ankles. Rapid alternating movement and fine finger movements intact. Romberg is absent. Posture and gait normal.   Skin: Skin is warm and dry. Capillary refill takes less than 2 seconds.  Nursing note and vitals reviewed.    ED Treatments / Results  Labs (all labs ordered are listed, but only abnormal results are displayed) Labs Reviewed - No data to display  EKG  EKG Interpretation None       Radiology Dg Ankle Complete Left  Result Date: 02/05/2017 CLINICAL DATA:  Pain following fall 8 days prior EXAM: LEFT ANKLE COMPLETE - 3+ VIEW COMPARISON:  Study obtained earlier in the day FINDINGS: Frontal, oblique, and lateral views were obtained. There is again noted an obliquely oriented fracture of the distal fibular diaphysis with slight lateral displacement of the distal fracture fragment with respect proximal fragment. No other fracture evident. There is a joint effusion. The ankle mortise appears grossly intact. There is no appreciable joint space narrowing. There are foci of arterial vascular calcification. IMPRESSION: Mildly displaced obliquely oriented fracture distal fibular diaphysis with ankle joint effusion. Ankle mortise appears grossly intact. Aortic  atherosclerotic vascular calcification noted anteriorly. Electronically Signed   By: Bretta BangWilliam  Woodruff III M.D.   On: 02/05/2017 14:51   Dg Ankle Complete Left  Result Date: 02/05/2017 CLINICAL DATA:  Left ankle pain after fall. EXAM: LEFT ANKLE COMPLETE - 3+ VIEW COMPARISON:  None. FINDINGS: Mildly displaced oblique fracture is seen involving the distal left fibula. Joint spaces are intact. Vascular calcifications are noted. IMPRESSION: Mildly displaced distal left fibular fracture. Electronically Signed   By: Lupita RaiderJames  Green Jr, M.D.   On: 02/05/2017 11:10   Ct Head Wo Contrast  Result Date: 02/05/2017 CLINICAL DATA:  Slipped on water 8 days ago and fell, with headache. Concern for cervical spine injury. Initial encounter. EXAM: CT HEAD WITHOUT CONTRAST CT CERVICAL SPINE WITHOUT CONTRAST TECHNIQUE: Multidetector CT imaging of the head and cervical spine was performed following the standard protocol without intravenous contrast. Multiplanar CT image reconstructions of the cervical spine were also generated. COMPARISON:  Cervical spine radiograph performed 03/31/2010 FINDINGS: CT HEAD FINDINGS Brain: No evidence of acute infarction, hemorrhage, hydrocephalus, extra-axial collection or mass lesion/mass effect. Prominence of the ventricles and sulci reflects mild cortical volume loss. Mild cerebellar atrophy is noted. Scattered periventricular  white matter change likely reflects small vessel ischemic microangiopathy. A small chronic lacunar infarct is noted at the right basal ganglia. The brainstem and fourth ventricle are within normal limits. The cerebral hemispheres demonstrate grossly normal gray-white differentiation. No mass effect or midline shift is seen. Vascular: No hyperdense vessel or unexpected calcification. Skull: There is no evidence of fracture; visualized osseous structures are unremarkable in appearance. Sinuses/Orbits: The visualized portions of the orbits are within normal limits. The  paranasal sinuses and mastoid air cells are well-aerated. Other: No significant soft tissue abnormalities are seen. CT CERVICAL SPINE FINDINGS Alignment: Normal. Skull base and vertebrae: No acute fracture. No primary bone lesion or focal pathologic process. The patient is status post anterior cervical spinal fusion at C3-C6. Soft tissues and spinal canal: No prevertebral fluid or swelling. No visible canal hematoma. Disc levels: Remaining visualized intervertebral disc spaces are preserved. Anterior osteophyte formation is noted along the upper thoracic spine. Upper chest: A 1.7 cm hypodensity is noted at the right thyroid lobe. The visualized lung apices are grossly clear. Other: No additional soft tissue abnormalities are seen. IMPRESSION: 1. No evidence of traumatic intracranial injury or fracture. 2. No evidence of acute fracture or subluxation along the cervical spine. 3. Mild cortical volume loss and scattered small vessel ischemic microangiopathy. 4. Small chronic lacunar infarct at the right basal ganglia. 5. Status post anterior cervical spinal fusion at C3-C6. 6. **An incidental finding of potential clinical significance has been found. 1.7 cm hypodensity at the right thyroid lobe. Consider further evaluation with thyroid ultrasound. If patient is clinically hyperthyroid, consider nuclear medicine thyroid uptake and scan.** Electronically Signed   By: Roanna Raider M.D.   On: 02/05/2017 17:20   Ct Cervical Spine Wo Contrast  Result Date: 02/05/2017 CLINICAL DATA:  Slipped on water 8 days ago and fell, with headache. Concern for cervical spine injury. Initial encounter. EXAM: CT HEAD WITHOUT CONTRAST CT CERVICAL SPINE WITHOUT CONTRAST TECHNIQUE: Multidetector CT imaging of the head and cervical spine was performed following the standard protocol without intravenous contrast. Multiplanar CT image reconstructions of the cervical spine were also generated. COMPARISON:  Cervical spine radiograph performed  03/31/2010 FINDINGS: CT HEAD FINDINGS Brain: No evidence of acute infarction, hemorrhage, hydrocephalus, extra-axial collection or mass lesion/mass effect. Prominence of the ventricles and sulci reflects mild cortical volume loss. Mild cerebellar atrophy is noted. Scattered periventricular white matter change likely reflects small vessel ischemic microangiopathy. A small chronic lacunar infarct is noted at the right basal ganglia. The brainstem and fourth ventricle are within normal limits. The cerebral hemispheres demonstrate grossly normal gray-white differentiation. No mass effect or midline shift is seen. Vascular: No hyperdense vessel or unexpected calcification. Skull: There is no evidence of fracture; visualized osseous structures are unremarkable in appearance. Sinuses/Orbits: The visualized portions of the orbits are within normal limits. The paranasal sinuses and mastoid air cells are well-aerated. Other: No significant soft tissue abnormalities are seen. CT CERVICAL SPINE FINDINGS Alignment: Normal. Skull base and vertebrae: No acute fracture. No primary bone lesion or focal pathologic process. The patient is status post anterior cervical spinal fusion at C3-C6. Soft tissues and spinal canal: No prevertebral fluid or swelling. No visible canal hematoma. Disc levels: Remaining visualized intervertebral disc spaces are preserved. Anterior osteophyte formation is noted along the upper thoracic spine. Upper chest: A 1.7 cm hypodensity is noted at the right thyroid lobe. The visualized lung apices are grossly clear. Other: No additional soft tissue abnormalities are seen. IMPRESSION: 1. No evidence of  traumatic intracranial injury or fracture. 2. No evidence of acute fracture or subluxation along the cervical spine. 3. Mild cortical volume loss and scattered small vessel ischemic microangiopathy. 4. Small chronic lacunar infarct at the right basal ganglia. 5. Status post anterior cervical spinal fusion at  C3-C6. 6. **An incidental finding of potential clinical significance has been found. 1.7 cm hypodensity at the right thyroid lobe. Consider further evaluation with thyroid ultrasound. If patient is clinically hyperthyroid, consider nuclear medicine thyroid uptake and scan.** Electronically Signed   By: Roanna Raider M.D.   On: 02/05/2017 17:20   Dg Foot Complete Left  Result Date: 02/05/2017 CLINICAL DATA:  Fall, injury EXAM: LEFT FOOT - COMPLETE 3+ VIEW COMPARISON:  None. FINDINGS: Three views of the left foot submitted. No acute fracture or subluxation. There is spurring at the base of fifth metatarsal. There is minimal displaced oblique fracture in distal fibula. IMPRESSION: No left foot acute fracture or subluxation. Spurring at the base of fifth metatarsal. Minimal displaced oblique fracture in distal left fibula. Electronically Signed   By: Natasha Mead M.D.   On: 02/05/2017 14:50   Dg Foot Complete Left  Result Date: 02/05/2017 CLINICAL DATA:  Left foot pain after fall. EXAM: LEFT FOOT - COMPLETE 3+ VIEW COMPARISON:  None. FINDINGS: Mildly displaced oblique fracture is seen involving the distal fibula. No other bony abnormality is noted. Joint spaces are intact. Vascular calcifications are noted. IMPRESSION: Mildly displaced distal fibular fracture. Electronically Signed   By: Lupita Raider, M.D.   On: 02/05/2017 11:09    Procedures Procedures (including critical care time)  Medications Ordered in ED Medications - No data to display   Initial Impression / Assessment and Plan / ED Course  I have reviewed the triage vital signs and the nursing notes.  Pertinent labs & imaging results that were available during my care of the patient were reviewed by me and considered in my medical decision making (see chart for details).     Pt presents to the ED with complaint of mechanical fall 8 days ago with left ankle pain. Pt did hit her head but denies any loc. She denies any ha or vision  changes. No focal neuro deficits. Ct of head and neck reveal no acute findings. Incidental finding of density or right thyroid lobe. Pt denies any hyperthyroid symptoms. She needs an outpatient follow up ultrasound. Instructed pt of findings and needs to follow up with pcp for imaging. PT also has distal fibula fracture that is mildly displaced but not angulated. Pt is neurovascularly intact. Has been ambulatory on left with normal gait. Placed in air cast and told her to avoid weightbearing as much as tolerated. Encouraged RICE therapy. Dicussed pt with Dr Jerl Santos who recommends follow up in the office. May use splint for comfort. PT given air cast. She has a cane at home. Gave pt info to call ortho office on Monday for follow up. Instruction given through interpreter.REfilleld pt htb and dm meds for 1 month supply. Pt was seen and examined by Dr. Donnald Garre who is agreeable to the above plan.  Pt is hemodynamically stable, in NAD, & able to ambulate in the ED. Pain has been managed & has no complaints prior to dc. Pt is comfortable with above plan and is stable for discharge at this time. All questions were answered prior to disposition. Strict return precautions for f/u to the ED were discussed.   Final Clinical Impressions(s) / ED Diagnoses   Final diagnoses:  Fall, initial encounter  Closed fracture of distal end of left fibula, unspecified fracture morphology, initial encounter  Thyroid nodule    New Prescriptions Discharge Medication List as of 02/05/2017  6:45 PM       Rise Mu, PA-C 02/06/17 0042    Arby Barrette, MD 02/10/17 [phone]

## 2017-02-05 NOTE — ED Notes (Signed)
Video interpreter Vernia BuffDaiana [phone]#[id number] used for triage.

## 2017-02-06 ENCOUNTER — Telehealth: Payer: Self-pay | Admitting: Physician Assistant

## 2017-02-06 LAB — CMP14+EGFR
ALT: 18 IU/L (ref 0–32)
AST: 21 IU/L (ref 0–40)
Albumin/Globulin Ratio: 1.4 (ref 1.2–2.2)
Albumin: 4.2 g/dL (ref 3.5–4.7)
Alkaline Phosphatase: 90 IU/L (ref 39–117)
BILIRUBIN TOTAL: 0.4 mg/dL (ref 0.0–1.2)
BUN/Creatinine Ratio: 20 (ref 12–28)
BUN: 16 mg/dL (ref 8–27)
CHLORIDE: 93 mmol/L — AB (ref 96–106)
CO2: 25 mmol/L (ref 18–29)
Calcium: 9.3 mg/dL (ref 8.7–10.3)
Creatinine, Ser: 0.81 mg/dL (ref 0.57–1.00)
GFR calc non Af Amer: 67 mL/min/{1.73_m2} (ref >59)
GFR, EST AFRICAN AMERICAN: 77 mL/min/{1.73_m2} (ref >59)
GLUCOSE: 95 mg/dL (ref 65–99)
Globulin, Total: 3.1 g/dL (ref 1.5–4.5)
Potassium: 4.2 mmol/L (ref 3.5–5.2)
Sodium: 136 mmol/L (ref 134–144)
TOTAL PROTEIN: 7.3 g/dL (ref 6.0–8.5)

## 2017-02-06 LAB — CBC
Hematocrit: 38.9 % (ref 34.0–46.6)
Hemoglobin: 12.4 g/dL (ref 11.1–15.9)
MCH: 27.6 pg (ref 26.6–33.0)
MCHC: 31.9 g/dL (ref 31.5–35.7)
MCV: 86 fL (ref 79–97)
Platelets: 362 x10E3/uL (ref 150–379)
RBC: 4.5 x10E6/uL (ref 3.77–5.28)
RDW: 13.3 % (ref 12.3–15.4)
WBC: 13.1 x10E3/uL — ABNORMAL HIGH (ref 3.4–10.8)

## 2017-02-06 LAB — HEMOGLOBIN A1C
Est. average glucose Bld gHb Est-mCnc: 114 mg/dL
HEMOGLOBIN A1C: 5.6 % (ref 4.8–5.6)

## 2017-02-06 NOTE — Telephone Encounter (Signed)
-----   Message from Rise MuKenneth T Leaphart, PA-C sent at 02/06/2017 12:31 AM EST ----- HI [name],  I am one of the PA in the ED who saw Mrs. Robin Andrews. I know you saw her today. I know that you probably don't always see every time. I completed her workup tonight. Her head ct was ok. Incidental finding of hypodensity in thyroid lobe. She needs an ultrasound of thyroid. I instructed them of these findings but not sure they completely understand. Just wanted to let her pcp know she that she gets close follow up. Also she asked for a refill of her medication and gave her a one supply. Just wanted you to be aware.  Thanks, [employer]

## 2017-02-17 DIAGNOSIS — S82832A Other fracture of upper and lower end of left fibula, initial encounter for closed fracture: Secondary | ICD-10-CM | POA: Diagnosis not present

## 2017-03-08 DIAGNOSIS — S82822D Torus fracture of lower end of left fibula, subsequent encounter for fracture with routine healing: Secondary | ICD-10-CM | POA: Diagnosis not present

## 2017-04-07 ENCOUNTER — Ambulatory Visit (INDEPENDENT_AMBULATORY_CARE_PROVIDER_SITE_OTHER): Payer: Medicare Other | Admitting: Family Medicine

## 2017-04-07 VITALS — BP 155/73 | HR 80 | Temp 98.1°F | Resp 16 | Ht 59.0 in | Wt 111.6 lb

## 2017-04-07 DIAGNOSIS — S82832A Other fracture of upper and lower end of left fibula, initial encounter for closed fracture: Secondary | ICD-10-CM | POA: Diagnosis not present

## 2017-04-07 DIAGNOSIS — M62838 Other muscle spasm: Secondary | ICD-10-CM

## 2017-04-07 DIAGNOSIS — G629 Polyneuropathy, unspecified: Secondary | ICD-10-CM | POA: Diagnosis not present

## 2017-04-07 DIAGNOSIS — E041 Nontoxic single thyroid nodule: Secondary | ICD-10-CM | POA: Diagnosis not present

## 2017-04-07 MED ORDER — GABAPENTIN 100 MG PO CAPS: 100.0000 mg | ORAL_CAPSULE | Freq: Every day | ORAL | 0 refills | Status: DC

## 2017-04-07 NOTE — Progress Notes (Signed)
Robin Andrews is a 83 y.o. female who presents to Primary Care at [facility] today for pain in R foot and neck pain.   [place] interpreter utilized for this encounter.   1. Pain in the right foot for 4 weeks, but now it is spreading to the knee. Pain is described as "tingling and annoying." It is on the dorsal aspect of her foot. Pain is constant.  Tylenol did not help. Ice bath (or warm bath, difficulty with translation) did help some. Nothing makes it worse. No weakness.  Sometimes it spreads up to below the knee. No recent injury. She notes a long time ago she had this issue, received medication, and the issue resolved. She does have a h/o DM, does not check CBGs at home. No injury. No fevers or chills. No rash   2. Neck pain: Present for 3-4 weeks in the back of the neck that is intermittent and dull in nature. It sometimes spreads downwards in the midline, other times in the shoulders and scapula.  No weakness, numbness, or tingling of the UEs. No h/o injury recently, has had surgeries in the past. She has tried multiple NSAIDs without improvement. No worsening factors (movement does not make the pain worse). No h/o IVDA. No fevers or chills.    ROS as above.  Pertinently, no chest pain, palpitations, SOB, Fever, Chills, Abd pain, N/V/D.   PMH reviewed. Patient is a nonsmoker.   Past Medical History:  Diagnosis Date  . Degenerative disc disease   . Diabetes mellitus without complication (HCC)   . Hypertension   . Pruritus    Past Surgical History:  Procedure Laterality Date  . DILATION AND CURETTAGE, DIAGNOSTIC / THERAPEUTIC    . NECK SURGERY      Medications reviewed. Current Outpatient Prescriptions  Medication Sig Dispense Refill  . amLODipine (NORVASC) 5 MG tablet Take 1 tablet (5 mg total) by mouth daily. 30 tablet 0  . glimepiride (AMARYL) 1 MG tablet Take 1 tablet (1 mg total) by mouth daily with breakfast. 30 tablet 0  . FLUZONE HIGH-DOSE 0.5 ML SUSY      No current  facility-administered medications for this visit.      Physical Exam:  BP (!) 155/73   Pulse 80   Temp 98.1 F (36.7 C) (Oral)   Resp 16   Ht  (1.499 m)   Wt 111 lb 9.6 oz (50.6 kg)   SpO2 100%   BMI 22.54 kg/m  Gen:  Alert, cooperative patient who appears stated age in no acute distress.  Vital signs reviewed. HEENT: EOMI,  MMM Neck: mild thyromegaly, R>L. No tenderness.  Pulm:  Clear to auscultation bilaterally with good air movement.  No wheezes or rales noted.   Cardiac:  Regular rate and rhythm without murmur auscultated.  Good S1/S2. Right foot: normal to inspection without rash, erythema, swelling. Sensation intact. Proprioception intact. Full ROM. 5/5 strength in ankle inversion/eversion, dorsiflexion, and plantar flexion.  Neck: normal to inspection. Patient endorses pain with palpation over cervical region in midline and left paraspinal muscles. Some muscular tension/ spasiscity noted.  Negative Spurlings. 5/5 strength in UEs b/l. Normal DTRs. Sensation grossly intact.   Assessment and Plan:  1.  Neuropathy: history and exam consistent with neuropathy. No red flags on exam.  H/o DM, appears well controlled based on CBGs. It is a little odd that it is only on the dorsal aspect of the foot. Rx for gabapentin , advised to take it qHS  PRN; if no drowsiness can take  BID PRN. If no improvement, pt to f/u in 2-4 weeks.   2. Neck pain: No red flags on exam or history. Most likely due to muscle spasm. Pt does have a h/o anterior cervical spinal fusion at C3-C6 but no evidence of radiculopathy on exam or history. Heat, ice, NSAIDs. Strict return precautions discussed.   3. Incidental thyroid nodule: 1.7 cm hypodensity at the right thyroid lobe. Will get TSH, may need [place] vs [facility] scan. Discussed finding and work up with patient.

## 2017-04-07 NOTE — Patient Instructions (Addendum)
B?t ??u dng gabapentin vo ban ?m v c?n ?au, b?n c th? dng n hai l?n m?i ngy n?u c?n nh?ng c th? khi?n b?n bu?n ng?. S? d?ng nhi?t trn c? c?a b?n v ko di th??ng xuyn. N?u b?n b? y?u ho?c t ? cnh tay, hy lin h? s?m h?n n?u khng theo di trong vng 1 thng. Chng ti s? g?i cho b?n v?i k?t qu? xt nghi?m mu tuy?n gip.  Start taking gabapentin at night for the pain, you can take it twice daily as needed but it can make you drowsy.  Use heat on your neck and do stretches regularly. If you developed weakness or numbness in your arms, follow up with [place] sooner, otherwise follow up in 1 month.  We will call you with the results of the thyroid blood test.   Bong gn c? (Cervical Sprain) Bong gn c? l t?n th??ng ? c? khi cc m x? kh?e (dy ch?ng) n?i cc x??ng c? c?a qu? v? b? gin ho?c rch. Bong gn c? c th? c m?c ?? t? nh? ??n n?ng. Bong gn c? n?ng c th? khi?n cho ??t s?ng c? khng ?n ??nh. ?i?u ny c th? d?n ??n t?n th??ng t?y s?ng v c th? gy ra cc v?n ?? nghim tr?ng ??i v?i h? th?n kinh. Th?i gian ?? tnh tr?ng bong gn c? ?? h?n ty thu?c vo nguyn nhn v m?c ?? t?n th??ng. H?u h?t cc tr??ng h?p bong gn c? kh?i trong 1 ??n 3 tu?n. NGUYN NHN Bong gn c? n?ng c th? do:  Cc t?n th??ng do ch?i mn th? thao ti?p xc (ch?ng h?n nh? bng ?, bng b?u d?c, v?t, khc cn c?u, ?ua  t, th? d?c, l?n, v thu?t, ho?c quy?n Anh).  Cc v? va ch?m xe  t.  Cc t?n th??ng do t?ng ho?c gi?m t?c ?? ??t ng?t. ?y l m?t t?n th??ng do chuy?n ??ng ??t ng?t c?a ??u v c? v? pha tr??c v pha sau.  T ng. Bong gn c? m?c ?? nh? c th? do:  Trong t? th? b?t ti?n, ch?ng h?n nh? k?p ?i?n tho?i gi?a tai v vai c?a qu v?.  Ng?i trn gh? khng c h? tr? thch h?p.  Lm vi?c t?i m?t tr?m my tnh c thi?t k? km ch?t l??ng.  Nhn ln ho?c nhn xu?ng trong th?i gian di. TRI?U CH?NG  ?au, ?au nh?c, c?ng ho?c c?m gic ?au rt b?ng ? pha tr??c, pha sau ho?c hai bn c?. C?m  gic kh ch?u ny c th? ti?n tri?n ngay l?p t?c sau khi b? t?n th??ng ho?c c th? ti?n tri?n ch?m, 24 gi? tr? ln sau khi b? t?n th??ng.  ?au ho?c c?m gic c?m ?au ? ph?n gi?a gy.  ?au vai ho?c l?ng trn.  Kh? n?ng c? ??ng c? b? h?n ch?.  ?au ??u.  Chng m?t.  Y?u, t ho?c ?au bu?t ? bn tay ho?c cnh tay.  Co th?t c?.  Kh nu?t ho?c kh nhai.  C?m gic ?au v s?ng ? c?. CH?N ?ON Chuyn gia ch?m [place] s?c kh?e ph?n l?n c th? ch?n ?on bong gn c? b?ng cch khai thc ti?n s? c?a qu v? v khm th?c th?Paulino Rily gia ch?m [place] s?c kh?e s? h?i v? cc t?n th??ng ? c? tr??c ?y v b?t k? v?n ?? no ? c? ? bi?t, ch?ng h?n nh? vim kh?p ? c?. C th? ch?p X-quang ?? xc ??nh xem c b?t k? v?n ??  no khc khng, ch?ng h?n nh? v?i x??ng c?. C?ng c th? c?n lm cc ki?m tra khc nh? ch?p CT ho?c MRI. ?I?U TR? ?i?u tr? ph? thu?c vo m?c ?? n?ng c?a bong gn c?. Bong gn m?c ?? nh? c th? ???c ?i?u tr? b?ng cch ngh? ng?i, gi? c? ? t? th? (c? ??nh) v thu?c gi?m ?au. Bong gn c? m?c ?? n?ng c?n ???c c? ??nh ngay l?p t?c. Vi?c ?i?u tr? ???c ti?p t?c ?? gip lm gi?m ?au, co th?t c? cc tri?u ch?ng khc v c th? bao g?m:  Thu?c, ch?ng h?n nh? thu?c gi?m ?au, thu?c t ho?c thu?c lm gin c?.  V?t l tr? li?u. Tr? li?u ny c th? bao g?m cc bi t?p ko dn, bi t?p t?ng c??ng s?c b?n v t?p theo t? th?. Bi t?p v c?i thi?n t? th? c th? gip ?n ??nh c?, t?ng c??ng c? b?p v gip ng?n ch?n cc tri?u ch?ng quay tr? l?i. H??NG D?N CH?M [place] T?I NH  Ch??m ? l?nh ln vng b? th??ng.  Cho ? l?nh vo ti nh?a.  ?? kh?n t?m vo gi?a da v ti.  ?? ? l?nh trong kho?ng 15-20 pht, 3-4 l?n m?i ngy.  N?u t?n th??ng n?ng, qu v? c th? ???c ?eo m?t vng n?p c? Vng n?p c? l m?t vng g?m hai m?nh ???c thi?t k? ?? gi? cho c? qu v? khng c? ??ng trong qu trnh h?i ph?c.  Khng tho vng n?p c?, tr? khi ???c chuyn gia ch?m [place] s?c kh?e h??ng d?n.  N?u tc qu v? di, hy cho tc ra bn ngoi vng  n?p c?.  H?i chuyn gia ch?m [place] s?c kh?e tr??c khi ?i?u ch?nh b?t k? ph?n no c?a vng n?p c?. C th? c?n ?i?u ch?nh m?t cht cc ph?n theo th?i gian ?? c c?m gic d? ch?u h?n v lm gi?m chn p ln c?m ho?c ln pha sau ??u.  N?uqu v? ???c php tho vng n?p ra ?? v? sinh ho?c t?m r?a, hy lm theo ch? d?n c?a chuyn gia ch?m [place] s?c kh?e v? cch th?c hi?n sao cho an ton.  Gi? cho vng n?p c? s?ch s? b?ng cch lau r?a b?ng x phng nh? v?i n??c v ph?i kh hon ton. N?u vng n?p c? ??a cho b?n dng c nh?ng mi?ng ??m c th? tho r?i, hy tho cc mi?ng ??m ? ra sau m?i 1 - 2 ngy m?t l?n v r?a cc mi?ng ??m b?ng tay v?i x phng v n??c. ?? cho cc mi?ng ??m t? kh. Cc mi?ng ??m ph?i kh hon ton tr??c khi qu v? l?p chng vo vng n?p c?.  N?u qu v? ???c php tho vng n?p c? ra ?? lm v? sinh v t?m r?a, hy r?a v lm kh ph?n da c? c?a qu v?. Ki?m tra da xem c b? kch ?ng ho?c l? lot khng. N?u c kch ?ng ho?c l? lot, hy ni cho chuyn gia ch?m [place] s?c kh?e bi?t.  Khng li xe trong khi ?eo vng n?p c?.  Ch? s? d?ng thu?c khng c?n k ??n ho?c thu?c c?n k ??n ?? gi?m ?au, gi?m c?m gic kh ch?u ho?c h? s?t theo ch? d?n c?a chuyn gia ch?m [place] s?c kh?e c?a qu v?.  Tun th? m?i cu?c h?n khm l?i theo ch? d?n c?a chuyn gia ch?m [place] s?c kh?e.  Tun th? m?i cu?c h?n lm v?t l tr? li?u theo ch? d?n c?a [place]  gia ch?m [place] s?c kh?e.  Th?c hi?n b?t c? ?i?u ch?nh no c?n thi?t ? ch? lm vi?c c?a qu v? ?? c t? th? ?ng.  Trnh nh?ng t? th? v ho?t ??ng lm cho cc tri?u ch?ng c?a qu v? t?i t? h?n.  Kh?i ??ng v ko dn tr??c khi ho?t ??ng ?? gip ng?n ng?a cc v?n ?? x?y ra. ?I [facility] N?U:  C?n ?au c?a qu v? khng ki?m sot ???c b?ng thu?c.  Qu v? khng th? gi?m l??ng thu?c gi?m ?au theo th?i gian theo k? ho?ch.  M?c ?? ho?t ??ng c?a qu v? khng c?i thi?n nh? mong ??i. NGAY L?P T?C ?I [facility] N?U:  Qu v? b? ch?y mu ? b?t c? ?u.  Qu v? c c?m gic bu?n  nn.  Qu v? c cc d?u hi?u b? m?t ph?n ?ng d? ?ng v?i thu?c.  Tri?u ch?ng c?a qu v? n?ng h?n.  Qu v? m?i c nh?ng tri?u ch?ng khng r nguyn nhn.  Qu v? b? t, ?au nhi, y?u ho?c li?t b?t c? ph?n no c?a c? th?. ??M B?O QY V?:  Hi?u cc h??ng d?n ny.  S? theo di tnh tr?ng c?a mnh.  S? yu c?u tr? gip ngay l?p t?c n?u b?n c?m th?y khng kh?e ho?c th?y tr?m tr?ng h?n. Thng tin ny khng nh?m m?c ?ch thay th? cho l?i khuyn m chuyn gia ch?m [place] s?c kh?e ni v?i qu v?. Hy b?o ??m qu v? ph?i th?o lu?n b?t k? v?n ?? g m qu v? c v?i chuyn gia ch?m [place] s?c kh?e c?a qu v?. Document Released: 11/26/2011 Document Revised: 09/27/2013 Document Reviewed: 06/14/2013 Elsevier Interactive Patient Education  2017 [place].     IF you received an x-ray today, you will receive an invoice from [facility]. Please contact [facility] at [phone] with questions or concerns regarding your invoice.   IF you received labwork today, you will receive an invoice from [place]. Please contact [facility] at [phone] with questions or concerns regarding your invoice.   Our billing staff will not be able to assist you with questions regarding bills from these companies.  You will be contacted with the lab results as soon as they are available. The fastest way to get your results is to activate your My Chart account. Instructions are located on the last page of this paperwork. If you have not heard from [place] regarding the results in 2 weeks, please contact this office.

## 2017-04-08 LAB — TSH: TSH: 0.95 u[IU]/mL (ref 0.450–4.500)

## 2017-04-09 NOTE — Addendum Note (Signed)
Addended byGwendolyn Grant, Amen Dargis H on: 04/09/2017 12:14 PM   Modules accepted: Orders

## 2017-04-16 ENCOUNTER — Telehealth: Payer: Self-pay | Admitting: Family Medicine

## 2017-04-16 NOTE — Telephone Encounter (Signed)
Wants pain medicine  I advised ov to evaluate , in meantime can talk to pharmacist about using tylenol or ibuprofen

## 2017-04-16 NOTE — Telephone Encounter (Signed)
PT [facility] FOR MEDICINE FOR PAIN PLEASE CALL AT [phone]

## 2017-04-17 NOTE — Telephone Encounter (Signed)
Agree. Thanks

## 2017-04-19 ENCOUNTER — Ambulatory Visit: Payer: Medicare Other | Admitting: Physician Assistant

## 2017-04-23 ENCOUNTER — Ambulatory Visit (INDEPENDENT_AMBULATORY_CARE_PROVIDER_SITE_OTHER): Payer: Medicare Other

## 2017-04-23 ENCOUNTER — Ambulatory Visit (INDEPENDENT_AMBULATORY_CARE_PROVIDER_SITE_OTHER): Payer: Medicare Other | Admitting: Physician Assistant

## 2017-04-23 VITALS — BP 130/70 | HR 77 | Temp 98.2°F | Resp 16 | Ht 59.0 in | Wt 110.6 lb

## 2017-04-23 DIAGNOSIS — M542 Cervicalgia: Secondary | ICD-10-CM

## 2017-04-23 MED ORDER — TRAMADOL-ACETAMINOPHEN 37.5-325 MG PO TABS: 0.5000 | ORAL_TABLET | Freq: Three times a day (TID) | ORAL | 1 refills | Status: DC | PRN

## 2017-04-23 NOTE — Patient Instructions (Signed)
Stop taking the gabapentin.  You will be getting a call from the neurosurgery office to be seen.

## 2017-04-23 NOTE — Progress Notes (Signed)
04/23/2017 11:20 AM   DOB: 01/04/1931 / MRN: [id number]  SUBJECTIVE:  Robin Andrews is a 83 y.o. female presenting for neck pain seen last on 4/18 with HPI as follows: "Neck pain: Present for 3-4 weeks in the back of the neck that is intermittent and dull in nature. It sometimes spreads downwards in the midline, other times in the shoulders and scapula.  No weakness, numbness, or tingling of the UEs. No h/o injury recently, has had surgeries in the past. She has tried multiple NSAIDs without improvement. No worsening factors (movement does not make the pain worse). No h/o IVDA. No fevers or chills."  Today she tells me the gabapentin is not helping at all. She has taken this daily as prescribed.  Continues to have midline and left sided cervical pain.  No numbness or weakness.  Feels that she is getting worse. Has tried multiple OTC medications without relief.   She is allergic to lisinopril.   She  has a past medical history of Degenerative disc disease; Diabetes mellitus without complication (HCC); Hypertension; and Pruritus.    She  reports that she has never smoked. She has never used smokeless tobacco. She reports that she does not drink alcohol or use drugs. She  reports that she does not engage in sexual activity. The patient  has a past surgical history that includes Neck surgery and Dilation and curettage, diagnostic / therapeutic.  Her family history is not on file.  Review of Systems  Eyes: Negative.   Musculoskeletal: Positive for neck pain. Negative for back pain, falls, joint pain and myalgias.  Neurological: Negative for dizziness, tingling, tremors, sensory change, speech change, focal weakness and headaches.    The problem list and medications were reviewed and updated by myself where necessary and exist elsewhere in the encounter.   OBJECTIVE:  BP 130/70 (BP Location: Right Arm, Patient Position: Sitting, Cuff Size: Normal)   Pulse 77   Temp 98.2 F (36.8 C) (Oral)   Resp  16   Ht 4\' 11"  (1.499 m)   Wt 110 lb 9.6 oz (50.2 kg)   SpO2 95%   BMI 22.34 kg/m   Physical Exam  Constitutional: She is oriented to person, place, and time. She is active.  Non-toxic appearance.  Cardiovascular: Normal rate, regular rhythm, S1 normal, S2 normal, normal heart sounds and intact distal pulses.  Exam reveals no gallop, no friction rub and no decreased pulses.   No murmur heard. Pulmonary/Chest: Effort normal. No stridor. No tachypnea. No respiratory distress. She has no wheezes. She has no rales.  Abdominal: She exhibits no distension.  Musculoskeletal: She exhibits no edema.  Neurological: She is alert and oriented to person, place, and time. She has normal reflexes. She displays normal reflexes. No cranial nerve deficit. She exhibits normal muscle tone. Coordination normal.  Skin: Skin is warm and dry. She is not diaphoretic. No pallor.    No results found for this or any previous visit (from the past 72 hour(s)).  Dg Cervical Spine Complete  Result Date: 04/23/2017 CLINICAL DATA:  Neck pain for 3-4 weeks.  No known injury. EXAM: CERVICAL SPINE - COMPLETE 4+ VIEW COMPARISON:  CT cervical spine 02/05/2017. FINDINGS: No fracture or malalignment is identified. The patient is status post solid C3-6 ACDF. Prevertebral soft tissues appear normal. Imaged lung parenchyma is clear. Aortic atherosclerosis is noted. IMPRESSION: No acute abnormality. Status post C3-6 ACDF. Atherosclerosis. Electronically Signed   By: Drusilla Kannerhomas  Dalessio M.D.   On: 04/23/2017 11:10  ASSESSMENT AND PLAN:  Ryian was seen today for neck pain.  Diagnoses and all orders for this visit:  Cervical pain (neck): I will get her back into neurosurgery for re-eval. Stopping gabapentin as she has had no relief and starting tramadol until she can be seen.   -     DG Cervical Spine Complete; Future    The patient is advised to call or return to clinic if she does not see an improvement in symptoms, or to seek  the care of the closest emergency department if she worsens with the above plan.   Deliah Boston, MHS, PA-C [facility] 04/23/2017 11:20 AM

## 2017-04-30 ENCOUNTER — Telehealth: Payer: Self-pay | Admitting: General Practice

## 2017-04-30 NOTE — Telephone Encounter (Signed)
Pt husband is calling stating that we need to change the patients meds what we called in is not working   [employer] number [phone]

## 2017-05-03 NOTE — Telephone Encounter (Signed)
PA for tramadol/APAP 37.5/325 was approved from 05/03/17-05/03/18. Approval # [id number].

## 2017-05-06 DIAGNOSIS — M542 Cervicalgia: Secondary | ICD-10-CM | POA: Diagnosis not present

## 2017-05-06 DIAGNOSIS — I10 Essential (primary) hypertension: Secondary | ICD-10-CM | POA: Diagnosis not present

## 2017-06-17 DIAGNOSIS — M542 Cervicalgia: Secondary | ICD-10-CM | POA: Diagnosis not present

## 2017-06-21 DIAGNOSIS — I10 Essential (primary) hypertension: Secondary | ICD-10-CM | POA: Diagnosis not present

## 2017-06-21 DIAGNOSIS — M542 Cervicalgia: Secondary | ICD-10-CM | POA: Diagnosis not present

## 2017-06-21 DIAGNOSIS — M2560 Stiffness of unspecified joint, not elsewhere classified: Secondary | ICD-10-CM | POA: Diagnosis not present

## 2017-06-21 DIAGNOSIS — R293 Abnormal posture: Secondary | ICD-10-CM | POA: Diagnosis not present

## 2017-06-25 DIAGNOSIS — M2560 Stiffness of unspecified joint, not elsewhere classified: Secondary | ICD-10-CM | POA: Diagnosis not present

## 2017-06-25 DIAGNOSIS — M542 Cervicalgia: Secondary | ICD-10-CM | POA: Diagnosis not present

## 2017-06-25 DIAGNOSIS — I10 Essential (primary) hypertension: Secondary | ICD-10-CM | POA: Diagnosis not present

## 2017-06-25 DIAGNOSIS — R293 Abnormal posture: Secondary | ICD-10-CM | POA: Diagnosis not present

## 2017-06-28 DIAGNOSIS — I10 Essential (primary) hypertension: Secondary | ICD-10-CM | POA: Diagnosis not present

## 2017-06-28 DIAGNOSIS — R293 Abnormal posture: Secondary | ICD-10-CM | POA: Diagnosis not present

## 2017-06-28 DIAGNOSIS — M542 Cervicalgia: Secondary | ICD-10-CM | POA: Diagnosis not present

## 2017-06-28 DIAGNOSIS — M2560 Stiffness of unspecified joint, not elsewhere classified: Secondary | ICD-10-CM | POA: Diagnosis not present

## 2017-07-02 ENCOUNTER — Ambulatory Visit (INDEPENDENT_AMBULATORY_CARE_PROVIDER_SITE_OTHER): Payer: Medicare Other | Admitting: Physician Assistant

## 2017-07-02 ENCOUNTER — Encounter: Payer: Self-pay | Admitting: Physician Assistant

## 2017-07-02 VITALS — BP 130/69 | HR 73 | Temp 98.5°F | Resp 18 | Ht 59.0 in | Wt 111.6 lb

## 2017-07-02 DIAGNOSIS — M542 Cervicalgia: Secondary | ICD-10-CM

## 2017-07-02 DIAGNOSIS — R293 Abnormal posture: Secondary | ICD-10-CM | POA: Diagnosis not present

## 2017-07-02 DIAGNOSIS — R8299 Other abnormal findings in urine: Secondary | ICD-10-CM

## 2017-07-02 DIAGNOSIS — L299 Pruritus, unspecified: Secondary | ICD-10-CM | POA: Diagnosis not present

## 2017-07-02 DIAGNOSIS — Z79899 Other long term (current) drug therapy: Secondary | ICD-10-CM | POA: Diagnosis not present

## 2017-07-02 DIAGNOSIS — E119 Type 2 diabetes mellitus without complications: Secondary | ICD-10-CM

## 2017-07-02 DIAGNOSIS — I10 Essential (primary) hypertension: Secondary | ICD-10-CM | POA: Diagnosis not present

## 2017-07-02 DIAGNOSIS — R82998 Other abnormal findings in urine: Secondary | ICD-10-CM

## 2017-07-02 DIAGNOSIS — M2560 Stiffness of unspecified joint, not elsewhere classified: Secondary | ICD-10-CM | POA: Diagnosis not present

## 2017-07-02 LAB — POC MICROSCOPIC URINALYSIS (UMFC): Mucus: ABSENT

## 2017-07-02 LAB — POCT URINALYSIS DIP (MANUAL ENTRY)
Bilirubin, UA: NEGATIVE
Blood, UA: NEGATIVE
Glucose, UA: NEGATIVE mg/dL
Ketones, POC UA: NEGATIVE mg/dL
Nitrite, UA: NEGATIVE
Protein Ur, POC: >=300 mg/dL — AB
Spec Grav, UA: 1.025 (ref 1.010–1.025)
Urobilinogen, UA: 0.2 U/dL
pH, UA: 6.5 (ref 5.0–8.0)

## 2017-07-02 MED ORDER — CEPHALEXIN 500 MG PO CAPS: 500.0000 mg | ORAL_CAPSULE | Freq: Two times a day (BID) | ORAL | 0 refills | Status: AC

## 2017-07-02 MED ORDER — GLIMEPIRIDE 1 MG PO TABS: 1.0000 mg | ORAL_TABLET | Freq: Every day | ORAL | 6 refills | Status: DC

## 2017-07-02 MED ORDER — HYDROXYZINE HCL 25 MG PO TABS: 12.5000 mg | ORAL_TABLET | Freq: Three times a day (TID) | ORAL | 3 refills | Status: DC | PRN

## 2017-07-02 MED ORDER — AMLODIPINE BESYLATE 5 MG PO TABS: 5.0000 mg | ORAL_TABLET | Freq: Every day | ORAL | 6 refills | Status: DC

## 2017-07-02 NOTE — Patient Instructions (Addendum)
Acupuncture: [facility] ([address], [place], [place] [address], [phone]) or [facility] ([address], [place], [phone]).  You will receive a phone call to schedule an appointment with orthopedics.   Thank you for coming in today. I hope you feel we met your needs.  Feel free to call [facility] if you have any questions or further requests.  Please consider signing up for MyChart if you do not already have it, as this is a great way to communicate with me.  Best,  Whitney McVey, PA-C  IF you received an x-ray today, you will receive an invoice from [facility]. Please contact [facility] at [phone] with questions or concerns regarding your invoice.   IF you received labwork today, you will receive an invoice from [facility]. Please contact [facility] at [phone] with questions or concerns regarding your invoice.   Our billing staff will not be able to assist you with questions regarding bills from these companies.  You will be contacted with the lab results as soon as they are available. The fastest way to get your results is to activate your My Chart account. Instructions are located on the last page of this paperwork. If you have not heard from us regarding the results in 2 weeks, please contact this office.     

## 2017-07-02 NOTE — Progress Notes (Signed)
Robin Andrews  MRN: [id number] DOB: Nov 09, 1931  PCP: Patient, No Pcp Per  Subjective:  Pt is an 83 year old female PMH DM, HTN, HLD, who presents to clinic for several complaints. She speaks [place] and mobile interpreter used today. She is here today with her husband.  DM - Glimepiride 10m qd. Reports compliance. Last A1C 01/2017: 5.6%. She checks home blood sugars "they are normal". 130, 120, 110. Denies n/t, vision changes, chest pain, palpitations, headache  HTN - Norvasc 548mqd. Controlled bp 130/69. Takes medications daily. Denies side effects.   Itchy neck - takes Atarax 2575mShe needs a refill.   H/o neck pain. This is her 3rd OV for the same c/c.  She had neck surgery 5 years ago.  OV 04/07/2017 HPI as follows: "Neck pain: Present for 3-4 weeks in the back of the neck that is intermittent and dull in nature. It sometimes spreads downwards in the midline, other times in the shoulders and scapula. No weakness, numbness, or tingling of the UEs. No h/o injury recently, has had surgeries in the past. She has tried multiple NSAIDs without improvement. No worsening factors (movement does not make the pain worse). No h/o IVDA. No fevers or chills."  Last OV 04/23/2017 HPI as follows: "Today she tells me the gabapentin is not helping at all. She has taken this daily as prescribed.  Continues to have midline and left sided cervical pain.  No numbness or weakness.  Feels that she is getting worse. Has tried multiple OTC medications without relief."  Dg C-spine showed no acute abnormality. She was referred to neurosurgery for re-evaluation, d/c'd from Gabapentin.  Today she c/o midline neck pain, is also present midline of midback. Does not radiate. Pain is constant. She says neurosurgery says she is too old for surgery and is not a candidate. Exercise (physical therapy) for neck pain x 2 weeks - this helped while she was performing exercises, however pain returned when she went home. She has  tried Tylenol - helps a little bit. She cannot cook or do anything on her own.    Review of Systems  Eyes: Negative for visual disturbance.  Respiratory: Negative for cough and shortness of breath.   Cardiovascular: Negative for chest pain and palpitations.  Gastrointestinal: Negative for nausea and vomiting.  Genitourinary: Negative for hematuria.  Musculoskeletal: Positive for back pain and neck pain. Negative for arthralgias and neck stiffness.  Neurological: Negative for dizziness, weakness, numbness and headaches.    Patient Active Problem List   Diagnosis Date Noted  . Hyponatremia 06/01/2013  . Weakness 06/01/2013  . Cervical disc disease 05/25/2012  . Chronic pain syndrome 05/25/2012  . Osteoarthritis 05/25/2012  . DYSLIPIDEMIA 05/30/2007  . HYPERTENSION, BENIGN ESSENTIAL 05/30/2007  . ALLERGIC RHINITIS 05/30/2007  . ASTEATOTIC ECZEMA 05/30/2007    Current Outpatient Prescriptions on File Prior to Visit  Medication Sig Dispense Refill  . amLODipine (NORVASC) 5 MG tablet Take 1 tablet (5 mg total) by mouth daily. 30 tablet 0  . FLUZONE HIGH-DOSE 0.5 ML SUSY     . glimepiride (AMARYL) 1 MG tablet Take 1 tablet (1 mg total) by mouth daily with breakfast. 30 tablet 0  . traMADol-acetaminophen (ULTRACET) 37.5-325 MG tablet Take 0.5-1 tablets by mouth every 8 (eight) hours as needed. 90 tablet 1   No current facility-administered medications on file prior to visit.     Allergies  Allergen Reactions  . Lisinopril      Objective:  BP 130/69 (  BP Location: Right Arm, Patient Position: Sitting, Cuff Size: Normal)   Pulse 73   Temp 98.5 F (36.9 C) (Oral)   Resp 18   Ht 4' 11"  (1.499 m)   Wt 111 lb 9.6 oz (50.6 kg)   SpO2 95%   BMI 22.54 kg/m   Physical Exam  Constitutional: She is oriented to person, place, and time and well-developed, well-nourished, and in no distress. No distress.  Neck: Full passive range of motion without pain. Neck supple. No spinous process  tenderness and no muscular tenderness present.  Cardiovascular: Normal rate, regular rhythm and normal heart sounds.   Neurological: She is alert and oriented to person, place, and time. She has normal motor skills, normal sensation and normal strength. GCS score is 15.  Skin: Skin is warm and dry.  Psychiatric: Mood, memory, affect and judgment normal.  Vitals reviewed.  Lab Results  Component Value Date   HGBA1C 5.6 02/05/2017    Results for orders placed or performed in visit on 07/02/17  POCT Microscopic Urinalysis ([facility])  Result Value Ref Range   WBC,UR,HPF,POC Few (A) None WBC/hpf   RBC,UR,HPF,POC None None RBC/hpf   Bacteria None None, Too numerous to count   Mucus Absent Absent   Epithelial Cells, UR Per Microscopy Few (A) None, Too numerous to count cells/hpf  POCT urinalysis dipstick  Result Value Ref Range   Color, UA yellow yellow   Clarity, UA clear clear   Glucose, UA negative negative mg/dL   Bilirubin, UA negative negative   Ketones, POC UA negative negative mg/dL   Spec Grav, UA 1.025 1.010 - 1.025   Blood, UA negative negative   pH, UA 6.5 5.0 - 8.0   Protein Ur, POC >=300 (A) negative mg/dL   Urobilinogen, UA 0.2 0.2 or 1.0 E.U./dL   Nitrite, UA Negative Negative   Leukocytes, UA Trace (A) Negative    Assessment and Plan :  1. Type 2 diabetes mellitus without complication, without long-term current use of insulin (HCC) - Hemoglobin A1c - Microalbumin, urine - glimepiride (AMARYL) 1 MG tablet; Take 1 tablet (1 mg total) by mouth daily with breakfast.  Dispense: 60 tablet; Refill: 6 - Last A1C 5.6%. Labs are pending. Will contact with results.   2. HYPERTENSION, BENIGN ESSENTIAL 3. Encounter for medication management - CMP14+EGFR - POCT Microscopic Urinalysis ([facility]) - POCT urinalysis dipstick - amLODipine (NORVASC) 5 MG tablet; Take 1 tablet (5 mg total) by mouth daily.  Dispense: 60 tablet; Refill: 6 - Blood pressure well controlled. OK to refill  medication. Labs are pending. RTC in 1 year for recheck.   4. Neck pain - Ambulatory referral to Orthopedic Surgery - Chronic neck pain. She has been evaluated by neurosurgery - she is not a candidate for surgery and there is no f/u. Will send to ortho for evaluation.   5. Itching - hydrOXYzine (ATARAX/VISTARIL) 25 MG tablet; Take 0.5-1 tablets (12.5-25 mg total) by mouth every 8 (eight) hours as needed for itching.  Dispense: 60 tablet; Refill: 3  6. Urine leukocytes - Urine Culture - cephALEXin (KEFLEX) 500 MG capsule; Take 1 capsule (500 mg total) by mouth 2 (two) times daily.  Dispense: 10 capsule; Refill: 0  Mercer Pod, PA-C  [facility] [place] 07/02/2017 2:32 PM

## 2017-07-03 LAB — CMP14+EGFR
ALT: 43 IU/L — ABNORMAL HIGH (ref 0–32)
AST: 28 IU/L (ref 0–40)
Albumin/Globulin Ratio: 1.5 (ref 1.2–2.2)
Albumin: 4.1 g/dL (ref 3.5–4.7)
Alkaline Phosphatase: 92 IU/L (ref 39–117)
BUN/Creatinine Ratio: 19 (ref 12–28)
BUN: 15 mg/dL (ref 8–27)
Bilirubin Total: 0.3 mg/dL (ref 0.0–1.2)
CO2: 26 mmol/L (ref 20–29)
Calcium: 9.6 mg/dL (ref 8.7–10.3)
Chloride: 95 mmol/L — ABNORMAL LOW (ref 96–106)
Creatinine, Ser: 0.8 mg/dL (ref 0.57–1.00)
GFR calc Af Amer: 78 mL/min/{1.73_m2} (ref >59)
GFR calc non Af Amer: 68 mL/min/{1.73_m2} (ref >59)
Globulin, Total: 2.7 g/dL (ref 1.5–4.5)
Glucose: 107 mg/dL — ABNORMAL HIGH (ref 65–99)
Potassium: 4.3 mmol/L (ref 3.5–5.2)
Sodium: 137 mmol/L (ref 134–144)
Total Protein: 6.8 g/dL (ref 6.0–8.5)

## 2017-07-03 LAB — HEMOGLOBIN A1C
Est. average glucose Bld gHb Est-mCnc: 120 mg/dL
Hgb A1c MFr Bld: 5.8 % — ABNORMAL HIGH (ref 4.8–5.6)

## 2017-07-03 LAB — MICROALBUMIN, URINE: Microalbumin, Urine: 1332.4 ug/mL

## 2017-07-09 DIAGNOSIS — M2560 Stiffness of unspecified joint, not elsewhere classified: Secondary | ICD-10-CM | POA: Diagnosis not present

## 2017-07-09 DIAGNOSIS — M542 Cervicalgia: Secondary | ICD-10-CM | POA: Diagnosis not present

## 2017-07-09 DIAGNOSIS — I10 Essential (primary) hypertension: Secondary | ICD-10-CM | POA: Diagnosis not present

## 2017-07-09 DIAGNOSIS — R293 Abnormal posture: Secondary | ICD-10-CM | POA: Diagnosis not present

## 2017-07-12 DIAGNOSIS — M2560 Stiffness of unspecified joint, not elsewhere classified: Secondary | ICD-10-CM | POA: Diagnosis not present

## 2017-07-12 DIAGNOSIS — R293 Abnormal posture: Secondary | ICD-10-CM | POA: Diagnosis not present

## 2017-07-12 DIAGNOSIS — M542 Cervicalgia: Secondary | ICD-10-CM | POA: Diagnosis not present

## 2017-07-12 DIAGNOSIS — I10 Essential (primary) hypertension: Secondary | ICD-10-CM | POA: Diagnosis not present

## 2017-07-19 DIAGNOSIS — R293 Abnormal posture: Secondary | ICD-10-CM | POA: Diagnosis not present

## 2017-07-19 DIAGNOSIS — M542 Cervicalgia: Secondary | ICD-10-CM | POA: Diagnosis not present

## 2017-07-19 DIAGNOSIS — M2560 Stiffness of unspecified joint, not elsewhere classified: Secondary | ICD-10-CM | POA: Diagnosis not present

## 2017-07-19 DIAGNOSIS — I10 Essential (primary) hypertension: Secondary | ICD-10-CM | POA: Diagnosis not present

## 2017-07-30 DIAGNOSIS — M2560 Stiffness of unspecified joint, not elsewhere classified: Secondary | ICD-10-CM | POA: Diagnosis not present

## 2017-07-30 DIAGNOSIS — I10 Essential (primary) hypertension: Secondary | ICD-10-CM | POA: Diagnosis not present

## 2017-07-30 DIAGNOSIS — R293 Abnormal posture: Secondary | ICD-10-CM | POA: Diagnosis not present

## 2017-07-30 DIAGNOSIS — M542 Cervicalgia: Secondary | ICD-10-CM | POA: Diagnosis not present

## 2017-08-06 DIAGNOSIS — M2560 Stiffness of unspecified joint, not elsewhere classified: Secondary | ICD-10-CM | POA: Diagnosis not present

## 2017-08-06 DIAGNOSIS — I10 Essential (primary) hypertension: Secondary | ICD-10-CM | POA: Diagnosis not present

## 2017-08-06 DIAGNOSIS — R293 Abnormal posture: Secondary | ICD-10-CM | POA: Diagnosis not present

## 2017-08-06 DIAGNOSIS — M542 Cervicalgia: Secondary | ICD-10-CM | POA: Diagnosis not present

## 2017-08-13 DIAGNOSIS — I10 Essential (primary) hypertension: Secondary | ICD-10-CM | POA: Diagnosis not present

## 2017-08-13 DIAGNOSIS — M542 Cervicalgia: Secondary | ICD-10-CM | POA: Diagnosis not present

## 2017-08-13 DIAGNOSIS — M2560 Stiffness of unspecified joint, not elsewhere classified: Secondary | ICD-10-CM | POA: Diagnosis not present

## 2017-08-13 DIAGNOSIS — R293 Abnormal posture: Secondary | ICD-10-CM | POA: Diagnosis not present

## 2017-08-24 ENCOUNTER — Ambulatory Visit (INDEPENDENT_AMBULATORY_CARE_PROVIDER_SITE_OTHER): Payer: Medicare Other | Admitting: Orthopaedic Surgery

## 2017-08-31 ENCOUNTER — Ambulatory Visit (INDEPENDENT_AMBULATORY_CARE_PROVIDER_SITE_OTHER): Payer: Medicare Other | Admitting: Orthopaedic Surgery

## 2017-09-01 ENCOUNTER — Ambulatory Visit (INDEPENDENT_AMBULATORY_CARE_PROVIDER_SITE_OTHER): Payer: Medicare Other | Admitting: Orthopaedic Surgery

## 2017-10-20 DIAGNOSIS — M542 Cervicalgia: Secondary | ICD-10-CM | POA: Diagnosis not present

## 2017-10-20 DIAGNOSIS — I10 Essential (primary) hypertension: Secondary | ICD-10-CM | POA: Diagnosis not present

## 2017-11-24 DIAGNOSIS — L309 Dermatitis, unspecified: Secondary | ICD-10-CM | POA: Diagnosis not present

## 2017-11-24 DIAGNOSIS — L299 Pruritus, unspecified: Secondary | ICD-10-CM | POA: Diagnosis not present

## 2017-12-15 ENCOUNTER — Ambulatory Visit (INDEPENDENT_AMBULATORY_CARE_PROVIDER_SITE_OTHER): Payer: Medicare Other | Admitting: Emergency Medicine

## 2017-12-15 ENCOUNTER — Encounter: Payer: Self-pay | Admitting: Emergency Medicine

## 2017-12-15 VITALS — BP 126/58 | HR 76 | Temp 98.8°F | Resp 18 | Ht 59.0 in | Wt 108.2 lb

## 2017-12-15 DIAGNOSIS — J22 Unspecified acute lower respiratory infection: Secondary | ICD-10-CM | POA: Diagnosis not present

## 2017-12-15 DIAGNOSIS — R059 Cough, unspecified: Secondary | ICD-10-CM

## 2017-12-15 DIAGNOSIS — R05 Cough: Secondary | ICD-10-CM

## 2017-12-15 MED ORDER — PROMETHAZINE-CODEINE 6.25-10 MG/5ML PO SYRP: 5.0000 mL | ORAL_SOLUTION | Freq: Every evening | ORAL | 0 refills | Status: DC | PRN

## 2017-12-15 MED ORDER — AMOXICILLIN-POT CLAVULANATE 875-125 MG PO TABS: 1.0000 | ORAL_TABLET | Freq: Two times a day (BID) | ORAL | 0 refills | Status: DC

## 2017-12-15 NOTE — Patient Instructions (Addendum)
IF you received an x-ray today, you will receive an invoice from [facility]. Please contact [facility] at [phone] with questions or concerns regarding your invoice.   IF you received labwork today, you will receive an invoice from [facility]. Please contact [facility] at [phone] with questions or concerns regarding your invoice.   Our billing staff will not be able to assist you with questions regarding bills from these companies.  You will be contacted with the lab results as soon as they are available. The fastest way to get your results is to activate your My Chart account. Instructions are located on the last page of this paperwork. If you have not heard from us regarding the results in 2 weeks, please contact this office.     Vim ph? qu?n c?p tnh, Ng??i l?n Acute Bronchitis, Adult Vim ph? qu?n c?p tnh l tnh tr?ng s?ng ??t ng?t (c?p tnh) cc ?ng d?n kh (ph? qu?n) trong ph?i. Vim ph? qu?n c?p tnh lm cho cc ?ng ny ??y d?ch nh?y, ?i?u ny c th? d?n ??n kh th?. B?nh c?ng c th? gy ho ho?c th? kh kh. ? ng??i l?n, vim ph? qu?n c?p tnh th??ng kh?i trong vng 2 tu?n. Ho do vim ph? qu?n c th? ko di ??n 3 tu?n. Ht thu?c, d? ?ng, v hen suy?n c th? lm cho tnh tr?ng ny tr?m tr?ng h?n. Nh?ng ??t vim ph? qu?n l?p ?i l?p l?i c th? gy thm cc v?n ?? ? ph?i, ch?ng h?n nh? b?nh ph?i t?c ngh?n m?n tnh (COPD). Nguyn nhn g gy ra? Tnh tr?ng ny c th? do cc m?m b?nh v cc ch?t gy kch ?ng ph?i gy ra, bao g?m:  Vi rt gy c?m l?nh v cm. Tnh tr?ng ny ph?n l?n th??ng do cng m?t lo?i vi rt gy c?m l?nh gy ra.  Vi khu?n.  Ti?p xc v?i khi thu?c l, b?i, khi v  nhi?m khng kh.  ?i?u g lm t?ng nguy c?? Tnh tr?ng ny hay x?y ra h?n ? nh?ng ng??i:  Ti?p xc g?n g?i v?i ai ? b? vim ph? qu?n c?p tnh.  Ti?p xc v?i nh?ng ch?t gy kch ?ng ph?i, ch?ng h?n nh? khi thu?c l, b?i, khi v h?i n??c.  C h? mi?n d?ch y?u.  C  tnh tr?ng b?nh l v? h h?p nh? hen suy?n.  Cc d?u hi?u ho?c tri?u ch?ng l g? Nh?ng tri?u ch?ng c?a tnh tr?ng ny bao g?m:  Ho.  Ho ra d?ch nh?y trong, mu vng ho?c mu xanh l cy.  Th? kh kh.  T?c ng?c.  Kh th?.  S?t.  ?au nh?c c? th?.  ?n l?nh.  ?au h?ng.  Ch?n ?on tnh tr?ng ny nh? th? no? Tnh tr?ng ny th???ng ???c ch?n ?on b?ng cch khm th?c th?Suzzette Righter. Trong qu trnh Loren Racerkhm, chuyn gia ch?m [place] s?c kh?e c?a qu v? c th? yu c?u cc xt nghi?m, ch?ng h?n nh? ch?p X-quang ng?c, ?? lo?i b? cc tnh tr?ng khc. Chuyn gia c?ng c th?:  Xt nghi?m m?u d?ch nh?y ?? xem c b? nhi?m vi khu?n khng.  Ki?m tra n?ng ?? oxi trong mu. Vi?c ny ???c th?c hi?n ?? xem tr? c b? vim ph?i khng.  Ch?p X-quang ng?c ho?c ki?m tra ch?c n?ng ph?i ?? lo?i tr? vim ph?i v nh?ng tnh tr?ng khc.  Xt nghi?m mu.  Chuyn gia ch?m [place] s?c kh?e c?a qu v? c?ng s? h?i v? cc tri?u ch?ng v b?nh s?  c?a qu v?. Tnh tr?ng ny ???c ?i?u tr? nh? th? no? H?u h?t cc tr??ng h?p vim ph? qu?n c?p tnh ??u kh?i theo th?i gian m khng c?n ?i?u tr?Shaune Pascal gia ch?m [place] s?c kh?e c?a qu v? c th? khuy?n ngh?:  U?ng nhi?u n??c h?n. U?ng nhi?u n??c h?n gip d?ch nh?y long h?n, c th? lm cho qu v? th? d? dng h?n.  U?ng thu?c gi?m s?t ho?c gi?m ho.  U?ng thu?c khng sinh.  S? d?ng my kh dung ?? c?i thi?n tnh tr?ng kh th? v ki?m sot ho.  S? d?ng my phun h?i n??c mt ho?c my ?i?u ?m ?? lm cho qu v? th? d? dng h?n.  Tun th? nh?ng h??ng d?n ny ? nh: Thu?c  Ch? s? d?ng thu?c khng k ??n v thu?c k ??n theo ch? d?n c?a chuyn gia ch?m [place] s?c kh?e.  N?u qu v? ???c k thu?c khng sinh, hy dng thu?c theo ch? d?n c?a chuyn gia ch?m [place] s?c kh?e. Khng d?ng u?ng thu?c khng sinh ngay c? khi qu v? b?t ??u c?m th?y ?? h?n. H??ng d?n chung  Ngh? ng?i th?t nhi?u.  U?ng ?? n??c ?? gi? cho n??c ti?u trong ho?c vng nh?t.  Trnh ht thu?c l v khi thu?c l th? ??ng. Ti?p  xc v?i khi thu?c l ho?c cc ha ch?t kch thch s? lm cho vim ph? qu?n n?ng h?n. N?u qu v? ht thu?c v c?n gip ?? ?? b? thu?c l, hy h?i chuyn gia ch?m [place] s?c kh?e. B? ht thu?c s? gip ph?i bnh ph?c nhanh h?n.  S? d?ng my kh dung, my phun h?i n??c mt ho?c my ?i?u ?m theo ch? d?n c?a chuyn gia ch?m [place] s?c kh?e.  Tun th? t?t c? cc l?n khm theo di theo ch? d?n c?a chuyn gia ch?m [place] s?c kh?e. ?i?u ny c vai tr quan tr?ng. Ng?n ng?a tnh tr?ng ny b?ng cch no? ?? gi?m nguy c? m?c l?i tnh tr?ng ny:  R?a tay th???ng xuyn b?ng x phng v n??c. N?u khng c x phng v n??c, hy dng thu?c st trng tay.  Trnh ti?p xc v?i nh?ng ng??i c tri?u ch?ng c?m l?nh.  C? g?ng khng ch?m tay ln mi?ng, m?i, ho?c m?t.  ??m b?o tim phng cm hng n?m.  Hy lin l?c v?i chuyn gia ch?m [place] s?c kh?e n?u:  Cc tri?u ch?ng c?a qu v? khng c?i thi?n sau 2 tu?n ?i?u tr?Cathie Hoops c?u tr? gip ngay l?p t?c n?u:  Qu v? ho ra mu.  Qu v? b? ?au ng?c.  Qu v? b? kh th? r?t nhi?u.  Qu v? b? m?t n??c.  Qu v? ng?t x?u ho?c qu v? c?m th?y nh? l s?p ng?t x?u.  Qu v? ti?p t?c nn m?a.  Qu v? b? ?au ??u nhi?u.  Tnh tr?ng s?t ho?c ?n l?nh c?a qu v? tr?m tr?ng h?n. Thng tin ny khng nh?m m?c ?ch thay th? cho l?i khuyn m chuyn gia ch?m [place] s?c kh?e ni v?i qu v?. Hy b?o ??m qu v? ph?i th?o lu?n b?t k? v?n ?? g m qu v? c v?i chuyn gia ch?m [place] s?c kh?e c?a qu v?. Document Released: 01/03/2016 Document Revised: 03/12/2017 Document Reviewed: 05/27/2016 Elsevier Interactive Patient Education  2018 [employer].  Bronquitis aguda en los adultos Acute Bronchitis, Adult La bronquitis aguda es la hinchazn repentina de las vas areas (bronquios) en los pulmones. Esta afeccin puede dificultar la respiracin. Adems, puede  provocar los siguientes sntomas:  Tos.  Despedir Neomia Dearuna mucosidad transparente, amarilla o verde al toser.  Sibilancias.  Congestin en  el pecho.  Falta de aire.  Grant RutsFiebre.  Dolores PepsiCoen el cuerpo.  Escalofros.  Dolor de Advertising copywritergarganta.  Siga estas indicaciones en su casa: Medicamentos  Baxter Internationalome los medicamentos de venta libre y los recetados solamente como se lo haya indicado el mdico.  Si le recetaron un antibitico, tmelo como se lo haya indicado el mdico. No deje de tomar los antibiticos aunque comience a Actorsentirse mejor. Instrucciones generales  Haga reposo.  Beba suficiente lquido para mantener el pis (orina) claro o de color amarillo plido.  Evite fumar o aspirar el humo de otros fumadores. Si necesita ayuda para dejar de fumar, consulte al mdico. Dejar de fumar ayudar a que los pulmones se curen ms rpido.  Use un inhalador, un humidificador o un vaporizador de vapor fro tal como se lo haya indicado el mdico.  Concurra a todas las visitas de control como se lo haya indicado el mdico. Esto es importante. Cmo se evita? Para disminuir el riesgo de volver a sufrir esta afeccin:  Lvese las manos frecuentemente con agua y [place]. Use un desinfectante para manos si no dispone de [place] y [place].  Evite el contacto con personas que tienen sntomas de resfro.  Trate de no llevar las manos a la boca, la nariz o los ojos.  Recuerde aplicarse la vacuna contra la gripe todos los [place].  Comunquese con un mdico si:  Los sntomas no mejoran en un plazo de 2semanas. Solicite ayuda de inmediato si:  Tose y Commercial Metals Companyescupe sangre.  Siente dolor en el pecho.  Sufre un episodio muy intenso de falta de aire.  Se deshidrata.  Se desmaya (pierde el conocimiento) o tiene una sensacin constante de desvanecimiento.  No deja de vomitar.  Siente un dolor de cabeza muy intenso.  La fiebre o los escalofros empeoran. Esta informacin no tiene Theme park managercomo fin reemplazar el consejo del mdico. Asegrese de hacerle al mdico cualquier pregunta que tenga. Document Released: 08/09/2013 Document Revised: 03/03/2017 Document  Reviewed: 05/27/2016 Elsevier Interactive Patient Education  [employer]2018 Elsevier Inc.

## 2017-12-15 NOTE — Progress Notes (Signed)
Robin Andrews 83 y.o.   Chief Complaint  Patient presents with  . Cough    Has taken Nyquil, does not relieve symptoms  . Fever    Per pt  . Fatigue  . Chills    HISTORY OF PRESENT ILLNESS: This is a 83 y.o. female complaining of flu-like symptoms x 1 week; persistent productive cough. No SOB; had fever this am.  HPI   Prior to Admission medications   Medication Sig Start Date End Date Taking? Authorizing Provider  amLODipine (NORVASC) 5 MG tablet Take 1 tablet (5 mg total) by mouth daily. 07/02/17  Yes McVey, Madelaine Bhat, PA-C  glimepiride (AMARYL) 1 MG tablet Take 1 tablet (1 mg total) by mouth daily with breakfast. 07/02/17  Yes McVey, Madelaine Bhat, PA-C  FLUZONE HIGH-DOSE 0.5 ML SUSY  09/07/16   [provider]  hydrOXYzine (ATARAX/VISTARIL) 25 MG tablet Take 0.5-1 tablets (12.5-25 mg total) by mouth every 8 (eight) hours as needed for itching. Patient not taking: Reported on 12/15/2017 07/02/17   McVey, Madelaine Bhat, PA-C  traMADol-acetaminophen (ULTRACET) 37.5-325 MG tablet Take 0.5-1 tablets by mouth every 8 (eight) hours as needed. Patient not taking: Reported on 12/15/2017 04/23/17   Ofilia Neas, PA-C    Allergies  Allergen Reactions  . Lisinopril     Patient Active Problem List   Diagnosis Date Noted  . Hyponatremia 06/01/2013  . Weakness 06/01/2013  . Cervical disc disease 05/25/2012  . Chronic pain syndrome 05/25/2012  . Osteoarthritis 05/25/2012  . DYSLIPIDEMIA 05/30/2007  . HYPERTENSION, BENIGN ESSENTIAL 05/30/2007  . ALLERGIC RHINITIS 05/30/2007  . ASTEATOTIC ECZEMA 05/30/2007    Past Medical History:  Diagnosis Date  . Degenerative disc disease   . Diabetes mellitus without complication (HCC)   . Hypertension   . Pruritus     Past Surgical History:  Procedure Laterality Date  . DILATION AND CURETTAGE, DIAGNOSTIC / THERAPEUTIC    . NECK SURGERY      Social History   Socioeconomic History  . Marital status: Widowed     Spouse name: Not on file  . Number of children: Not on file  . Years of education: Not on file  . Highest education level: Not on file  Social Needs  . Financial resource strain: Not on file  . Food insecurity - worry: Not on file  . Food insecurity - inability: Not on file  . Transportation needs - medical: Not on file  . Transportation needs - non-medical: Not on file  Occupational History  . Not on file  Tobacco Use  . Smoking status: Never Smoker  . Smokeless tobacco: Never Used  Substance and Sexual Activity  . Alcohol use: No  . Drug use: No  . Sexual activity: No    Birth control/protection: Abstinence  Other Topics Concern  . Not on file  Social History Narrative  . Not on file    No family history on file.   Review of Systems  Constitutional: Positive for chills, fever and malaise/fatigue.  HENT: Negative for sore throat.   Eyes: Negative.   Respiratory: Positive for cough and sputum production. Negative for shortness of breath.   Cardiovascular: Negative for chest pain, palpitations and leg swelling.  Gastrointestinal: Negative.  Negative for abdominal pain, diarrhea, nausea and vomiting.  Genitourinary: Negative.  Negative for dysuria and hematuria.  Musculoskeletal: Negative.   Skin: Negative for rash.  Neurological: Negative.  Negative for dizziness and headaches.  Endo/Heme/Allergies: Negative.   All other systems  reviewed and are negative.  Vitals:   12/15/17 1209  BP: (!) 126/58  Pulse: 76  Resp: 18  Temp: 98.8 F (37.1 C)  SpO2: 95%     Physical Exam  Constitutional: She appears well-developed and well-nourished.  HENT:  Head: Normocephalic and atraumatic.  Right Ear: External ear normal.  Left Ear: External ear normal.  Nose: Nose normal.  Mouth/Throat: Oropharynx is clear and moist.  Eyes: Conjunctivae and EOM are normal. Pupils are equal, round, and reactive to light.  Neck: Normal range of motion. Neck supple. No JVD present. No  thyromegaly present.  Cardiovascular: Normal rate, regular rhythm, normal heart sounds and intact distal pulses.  Pulmonary/Chest: Effort normal and breath sounds normal. No respiratory distress.  Abdominal: Soft. Bowel sounds are normal. She exhibits no distension. There is no tenderness.  Musculoskeletal: Normal range of motion. She exhibits no edema or tenderness.  Lymphadenopathy:    She has no cervical adenopathy.  Neurological: She is alert. No sensory deficit. She exhibits normal muscle tone.  Skin: Skin is warm and dry. Capillary refill takes less than 2 seconds. No rash noted.  Psychiatric: She has a normal mood and affect. Her behavior is normal.  Vitals reviewed.    ASSESSMENT & PLAN: Robin Andrews was seen today for cough, fever, fatigue and chills.  Diagnoses and all orders for this visit:  Cough -     promethazine-codeine (PHENERGAN WITH CODEINE) 6.25-10 MG/5ML syrup; Take 5 mLs by mouth at bedtime as needed for cough.  Lower respiratory infection -     amoxicillin-clavulanate (AUGMENTIN) 875-125 MG tablet; Take 1 tablet by mouth 2 (two) times daily for 7 days.    Patient Instructions       IF you received an x-ray today, you will receive an invoice from [facility]. Please contact [facility] at [phone] with questions or concerns regarding your invoice.   IF you received labwork today, you will receive an invoice from [place]. Please contact [facility] at [phone] with questions or concerns regarding your invoice.   Our billing staff will not be able to assist you with questions regarding bills from these companies.  You will be contacted with the lab results as soon as they are available. The fastest way to get your results is to activate your My Chart account. Instructions are located on the last page of this paperwork. If you have not heard from [place] regarding the results in 2 weeks, please contact this office.     Vim ph? qu?n c?p tnh,  Ng??i l?n Acute Bronchitis, Adult Vim ph? qu?n c?p tnh l tnh tr?ng s?ng ??t ng?t (c?p tnh) cc ?ng d?n kh (ph? qu?n) trong ph?i. Vim ph? qu?n c?p tnh lm cho cc ?ng ny ??y d?ch nh?y, ?i?u ny c th? d?n ??n kh th?. B?nh c?ng c th? gy ho ho?c th? kh kh. ? ng??i l?n, vim ph? qu?n c?p tnh th??ng kh?i trong vng 2 tu?n. Ho do vim ph? qu?n c th? ko di ??n 3 tu?n. Ht thu?c, d? ?ng, v hen suy?n c th? lm cho tnh tr?ng ny tr?m tr?ng h?n. Nh?ng ??t vim ph? qu?n l?p ?i l?p l?i c th? gy thm cc v?n ?? ? ph?i, ch?ng h?n nh? b?nh ph?i t?c ngh?n m?n tnh (COPD). Nguyn nhn g gy ra? Tnh tr?ng ny c th? do cc m?m b?nh v cc ch?t gy kch ?ng ph?i gy ra, bao g?m:  Vi rt gy c?m l?nh v cm. Tnh tr?ng ny ph?n l?n th??ng  do cng m?t lo?i vi rt gy c?m l?nh gy ra.  Vi khu?n.  Ti?p xc v?i khi thu?c l, b?i, khi v  nhi?m khng kh.  ?i?u g lm t?ng nguy c?? Tnh tr?ng ny hay x?y ra h?n ? nh?ng ng??i:  Ti?p xc g?n g?i v?i ai ? b? vim ph? qu?n c?p tnh.  Ti?p xc v?i nh?ng ch?t gy kch ?ng ph?i, ch?ng h?n nh? khi thu?c l, b?i, khi v h?i n??c.  C h? mi?n d?ch y?u.  C tnh tr?ng b?nh l v? h h?p nh? hen suy?n.  Cc d?u hi?u ho?c tri?u ch?ng l g? Nh?ng tri?u ch?ng c?a tnh tr?ng ny bao g?m:  Ho.  Ho ra d?ch nh?y trong, mu vng ho?c mu xanh l cy.  Th? kh kh.  T?c ng?c.  Kh th?.  S?t.  ?au nh?c c? th?.  ?n l?nh.  ?au h?ng.  Ch?n ?on tnh tr?ng ny nh? th? no? Tnh tr?ng ny th???ng ???c ch?n ?on b?ng cch khm th?c th?Suzzette Righter. Trong qu trnh Robin Andrews, chuyn gia ch?m [place] s?c kh?e c?a qu v? c th? yu c?u cc xt nghi?m, ch?ng h?n nh? ch?p X-quang ng?c, ?? lo?i b? cc tnh tr?ng khc. Chuyn gia c?ng c th?:  Xt nghi?m m?u d?ch nh?y ?? xem c b? nhi?m vi khu?n khng.  Ki?m tra n?ng ?? oxi trong mu. Vi?c ny ???c th?c hi?n ?? xem tr? c b? vim ph?i khng.  Ch?p X-quang ng?c ho?c ki?m tra ch?c n?ng ph?i ?? lo?i tr? vim ph?i v  nh?ng tnh tr?ng khc.  Xt nghi?m mu.  Chuyn gia ch?m [place] s?c kh?e c?a qu v? c?ng s? h?i v? cc tri?u ch?ng v b?nh s? c?a qu v?. Tnh tr?ng ny ???c ?i?u tr? nh? th? no? H?u h?t cc tr??ng h?p vim ph? qu?n c?p tnh ??u kh?i theo th?i gian m khng c?n ?i?u tr?Shaune Pascal. Chuyn gia ch?m [place] s?c kh?e c?a qu v? c th? khuy?n ngh?:  U?ng nhi?u n??c h?n. U?ng nhi?u n??c h?n gip d?ch nh?y long h?n, c th? lm cho qu v? th? d? dng h?n.  U?ng thu?c gi?m s?t ho?c gi?m ho.  U?ng thu?c khng sinh.  S? d?ng my kh dung ?? c?i thi?n tnh tr?ng kh th? v ki?m sot ho.  S? d?ng my phun h?i n??c mt ho?c my ?i?u ?m ?? lm cho qu v? th? d? dng h?n.  Tun th? nh?ng h??ng d?n ny ? nh: Thu?c  Ch? s? d?ng thu?c khng k ??n v thu?c k ??n theo ch? d?n c?a chuyn gia ch?m [place] s?c kh?e.  N?u qu v? ???c k thu?c khng sinh, hy dng thu?c theo ch? d?n c?a chuyn gia ch?m [place] s?c kh?e. Khng d?ng u?ng thu?c khng sinh ngay c? khi qu v? b?t ??u c?m th?y ?? h?n. H??ng d?n chung  Ngh? ng?i th?t nhi?u.  U?ng ?? n??c ?? gi? cho n??c ti?u trong ho?c vng nh?t.  Trnh ht thu?c l v khi thu?c l th? ??ng. Ti?p xc v?i khi thu?c l ho?c cc ha ch?t kch thch s? lm cho vim ph? qu?n n?ng h?n. N?u qu v? ht thu?c v c?n gip ?? ?? b? thu?c l, hy h?i chuyn gia ch?m [place] s?c kh?e. B? ht thu?c s? gip ph?i bnh ph?c nhanh h?n.  S? d?ng my kh dung, my phun h?i n??c mt ho?c my ?i?u ?m theo ch? d?n c?a chuyn gia ch?m  s?c kh?e.  Tun th? t?t c? cc l?n khm  theo di theo ch? d?n c?a chuyn gia ch?m [place] s?c kh?e. ?i?u ny c vai tr quan tr?ng. Ng?n ng?a tnh tr?ng ny b?ng cch no? ?? gi?m nguy c? m?c l?i tnh tr?ng ny:  R?a tay th???ng xuyn b?ng x phng v n??c. N?u khng c x phng v n??c, hy dng thu?c st trng tay.  Trnh ti?p xc v?i nh?ng ng??i c tri?u ch?ng c?m l?nh.  C? g?ng khng ch?m tay ln mi?ng, m?i, ho?c m?t.  ??m b?o tim phng cm hng n?m.  Hy lin  l?c v?i chuyn gia ch?m [place] s?c kh?e n?u:  Cc tri?u ch?ng c?a qu v? khng c?i thi?n sau 2 tu?n ?i?u tr?Cathie Hoops. Yu c?u tr? gip ngay l?p t?c n?u:  Qu v? ho ra mu.  Qu v? b? ?au ng?c.  Qu v? b? kh th? r?t nhi?u.  Qu v? b? m?t n??c.  Qu v? ng?t x?u ho?c qu v? c?m th?y nh? l s?p ng?t x?u.  Qu v? ti?p t?c nn m?a.  Qu v? b? ?au ??u nhi?u.  Tnh tr?ng s?t ho?c ?n l?nh c?a qu v? tr?m tr?ng h?n. Thng tin ny khng nh?m m?c ?ch thay th? cho l?i khuyn m chuyn gia ch?m [place] s?c kh?e ni v?i qu v?. Hy b?o ??m qu v? ph?i th?o lu?n b?t k? v?n ?? g m qu v? c v?i chuyn gia ch?m  s?c kh?e c?a qu v?. Document Released: 01/03/2016 Document Revised: 03/12/2017 Document Reviewed: 05/27/2016 Elsevier Interactive Patient Education  2018 [employer]  Bronquitis aguda en los adultos Acute Bronchitis, Adult La bronquitis aguda es la hinchazn repentina de las vas areas (bronquios) en los pulmones. Esta afeccin puede dificultar la respiracin. Adems, puede provocar los siguientes sntomas:  Tos.  Despedir Neomia Dearuna mucosidad transparente, amarilla o verde al toser.  Sibilancias.  Congestin en el pecho.  Falta de aire.  Grant RutsFiebre.  Dolores PepsiCoen el cuerpo.  Escalofros.  Dolor de Advertising copywritergarganta.  Siga estas indicaciones en su casa: Medicamentos  Baxter Internationalome los medicamentos de venta libre y los recetados solamente como se lo haya indicado el mdico.  Si le recetaron un antibitico, tmelo como se lo haya indicado el mdico. No deje de tomar los antibiticos aunque comience a Actorsentirse mejor. Instrucciones generales  Haga reposo.  Beba suficiente lquido para mantener el pis (orina) claro o de color amarillo plido.  Evite fumar o aspirar el humo de otros fumadores. Si necesita ayuda para dejar de fumar, consulte al mdico. Dejar de fumar ayudar a que los pulmones se curen ms rpido.  Use un inhalador, un humidificador o un vaporizador de vapor fro tal como se lo haya  indicado el mdico.  Concurra a todas las visitas de control como se lo haya indicado el mdico. Esto es importante. Cmo se evita? Para disminuir el riesgo de volver a sufrir esta afeccin:  Lvese las manos frecuentemente con agua y [place]. Use un desinfectante para manos si no dispone de [place] y [place].  Evite el contacto con personas que tienen sntomas de resfro.  Trate de no llevar las manos a la boca, la nariz o los ojos.  Recuerde aplicarse la vacuna contra la gripe todos los [place].  Comunquese con un mdico si:  Los sntomas no mejoran en un plazo de 2semanas. Solicite ayuda de inmediato si:  Tose y Commercial Metals Companyescupe sangre.  Siente dolor en el pecho.  Sufre un episodio muy intenso de falta de aire.  Se deshidrata.  Se desmaya (pierde el conocimiento) o tiene Physiological scientistuna  sensacin constante de desvanecimiento.  No deja de vomitar.  Siente un dolor de cabeza muy intenso.  La fiebre o los escalofros empeoran. Esta informacin no tiene Theme park manager el consejo del mdico. Asegrese de hacerle al mdico cualquier pregunta que tenga. Document Released: 08/09/2013 Document Revised: 03/03/2017 Document Reviewed: 05/27/2016 Elsevier Interactive Patient Education  2018 Elsevier Inc.      Edwina Barth, MD [facility]

## 2017-12-18 ENCOUNTER — Encounter: Payer: Self-pay | Admitting: Physician Assistant

## 2017-12-18 ENCOUNTER — Ambulatory Visit (INDEPENDENT_AMBULATORY_CARE_PROVIDER_SITE_OTHER): Payer: Medicare Other | Admitting: Physician Assistant

## 2017-12-18 ENCOUNTER — Other Ambulatory Visit: Payer: Self-pay

## 2017-12-18 VITALS — BP 130/62 | HR 72 | Temp 98.8°F | Resp 18 | Ht 59.0 in | Wt 108.6 lb

## 2017-12-18 DIAGNOSIS — E119 Type 2 diabetes mellitus without complications: Secondary | ICD-10-CM | POA: Diagnosis not present

## 2017-12-18 DIAGNOSIS — I10 Essential (primary) hypertension: Secondary | ICD-10-CM | POA: Diagnosis not present

## 2017-12-18 DIAGNOSIS — E1169 Type 2 diabetes mellitus with other specified complication: Secondary | ICD-10-CM | POA: Insufficient documentation

## 2017-12-18 DIAGNOSIS — R9431 Abnormal electrocardiogram [ECG] [EKG]: Secondary | ICD-10-CM | POA: Diagnosis not present

## 2017-12-18 DIAGNOSIS — R42 Dizziness and giddiness: Secondary | ICD-10-CM | POA: Diagnosis not present

## 2017-12-18 DIAGNOSIS — R0989 Other specified symptoms and signs involving the circulatory and respiratory systems: Secondary | ICD-10-CM | POA: Diagnosis not present

## 2017-12-18 DIAGNOSIS — E785 Hyperlipidemia, unspecified: Secondary | ICD-10-CM | POA: Diagnosis not present

## 2017-12-18 DIAGNOSIS — R011 Cardiac murmur, unspecified: Secondary | ICD-10-CM

## 2017-12-18 LAB — POCT CBC
Granulocyte percent: 69.1 %G (ref 37–80)
HEMATOCRIT: 38.9 % (ref 37.7–47.9)
HEMOGLOBIN: 12.7 g/dL (ref 12.2–16.2)
LYMPH, POC: 2.2 (ref 0.6–3.4)
MCH, POC: 28.5 pg (ref 27–31.2)
MCHC: 32.7 g/dL (ref 31.8–35.4)
MCV: 87.2 fL (ref 80–97)
MID (CBC): 0.7 (ref 0–0.9)
MPV: 7 fL (ref 0–99.8)
POC Granulocyte: 6.4 (ref 2–6.9)
POC LYMPH %: 23.7 % (ref 10–50)
POC MID %: 7.2 % (ref 0–12)
Platelet Count, POC: 362 10*3/uL (ref 142–424)
RBC: 4.46 M/uL (ref 4.04–5.48)
RDW, POC: 12 %
WBC: 9.3 10*3/uL (ref 4.6–10.2)

## 2017-12-18 LAB — POC MICROSCOPIC URINALYSIS (UMFC): MUCUS RE: ABSENT

## 2017-12-18 LAB — POCT URINALYSIS DIP (MANUAL ENTRY)
Bilirubin, UA: NEGATIVE
Blood, UA: NEGATIVE
Glucose, UA: NEGATIVE mg/dL
Ketones, POC UA: NEGATIVE mg/dL
NITRITE UA: NEGATIVE
SPEC GRAV UA: 1.015 (ref 1.010–1.025)
UROBILINOGEN UA: 0.2 U/dL
pH, UA: 6.5 (ref 5.0–8.0)

## 2017-12-18 LAB — GLUCOSE, POCT (MANUAL RESULT ENTRY): POC Glucose: 79 mg/dl (ref 70–99)

## 2017-12-18 LAB — POCT GLYCOSYLATED HEMOGLOBIN (HGB A1C): Hemoglobin A1C: 5.5

## 2017-12-18 MED ORDER — MECLIZINE HCL 12.5 MG PO TABS: 12.5000 mg | ORAL_TABLET | Freq: Every day | ORAL | 0 refills | Status: DC

## 2017-12-18 NOTE — Progress Notes (Signed)
Patient ID: Robin Andrews, female    DOB: 09-06-1931, 83 y.o.   MRN: [id number]  PCP: Patient, No Pcp Per  Chief Complaint  Patient presents with  . Hypertension    pt is taking medication differently then prescribed is taking 2 instead of 1     Subjective:   Presents for evaluation of high blood pressure. She is accompanied by a friend, and Str[employer] video translation used.  Beginning on 12/14/2017, she is experiencing elevated BP during the night. She awakens abruptly feeling like the ceiling may fall on her, that the room is spinning. She is terrified, and calls her friend (who is present today) who comes over and checks her blood pressure. He reports that each time, the SBP is 180's, so he gives her 1/2 tablet of amlodipine. The SBP comes down to 150-160's temporarily and then goes back up to 180's, so he administers the other 1/2 tablet.  Symptoms do not occur during the daytime when she is up and awake, even with rapid position change.  No previous symptoms like this.  No associated urinary burning, increased frequency or increased urgency. No diarrhea or constipation. No appetite changes. No chest pain, shortness of breath, blurred vision. No new or different muscle or joint pain. No nausea or vomiting. She is afraid to get up during the night to go to the bathroom for fear of falling.  It is notable that she was seen here by 1 of my colleagues 3 days ago with 1 week of flulike symptoms and cough.  Blood pressure was normal at that time.  Examination was unremarkable.  She was diagnosed with a lower respiratory infection and prescribed amoxicillin-clavulanate as well as promethazine with codeine.  She took only 1 dose of the antibiotic but reports that it made her feel tired and that was frightening so she stopped it.  Her chronic medical problems include diabetes type 2, uncomplicated, controlled, without long-term insulin use, hypertension and hyperlipidemia.  She does not  take a daily aspirin or  A1C 5.8%, normal renal and hepatic function 07/02/2017. No lipid profile in the EMR. She does not take a daily aspirin or statin.  No metformin.  Health Maintenance Due  Topic Date Due  . FOOT EXAM  01/26/1941  . OPHTHALMOLOGY EXAM  01/26/1941  . DEXA SCAN  01/26/1998  . PNA vac Low Risk Adult (1 of 2 - PCV13) 01/26/1998  . TETANUS/TDAP  06/23/2015      Review of Systems As above.    Patient Active Problem List   Diagnosis Date Noted  . Hyponatremia 06/01/2013  . Weakness 06/01/2013  . Cervical disc disease 05/25/2012  . Chronic pain syndrome 05/25/2012  . Osteoarthritis 05/25/2012  . DYSLIPIDEMIA 05/30/2007  . HYPERTENSION, BENIGN ESSENTIAL 05/30/2007  . ALLERGIC RHINITIS 05/30/2007  . ASTEATOTIC ECZEMA 05/30/2007     Prior to Admission medications   Medication Sig Start Date End Date Taking? Authorizing Provider  amLODipine (NORVASC) 5 MG tablet Take 1 tablet (5 mg total) by mouth daily. 07/02/17  Yes McVey, Madelaine Bhat, PA-C  glimepiride (AMARYL) 1 MG tablet Take 1 tablet (1 mg total) by mouth daily with breakfast. 07/02/17  Yes McVey, Madelaine Bhat, PA-C  hydrOXYzine (ATARAX/VISTARIL) 25 MG tablet Take 0.5-1 tablets (12.5-25 mg total) by mouth every 8 (eight) hours as needed for itching. 07/02/17  Yes McVey, Madelaine Bhat, PA-C  traMADol-acetaminophen (ULTRACET) 37.5-325 MG tablet Take 0.5-1 tablets by mouth every 8 (eight) hours as needed. 04/23/17  Yes Ofilia Neaslark, Michael L, PA-C     Allergies  Allergen Reactions  . Lisinopril        Objective:  Physical Exam  Constitutional: She is oriented to person, place, and time. She appears well-developed and well-nourished. She is active and cooperative. No distress.  BP 130/62   Pulse 72   Temp 98.8 F (37.1 C) (Oral)   Resp 18   Ht 4\' 11"  (1.499 m)   Wt 108 lb 9.6 oz (49.3 kg)   SpO2 96%   BMI 21.93 kg/m   HENT:  Head: Normocephalic and atraumatic.  Right Ear: Hearing,  tympanic membrane, external ear and ear canal normal.  Left Ear: Hearing, tympanic membrane, external ear and ear canal normal.  Nose: Nose normal. Right sinus exhibits no maxillary sinus tenderness and no frontal sinus tenderness. Left sinus exhibits no maxillary sinus tenderness and no frontal sinus tenderness.  Mouth/Throat: Uvula is midline, oropharynx is clear and moist and mucous membranes are normal. No oropharyngeal exudate.  Eyes: Conjunctivae are normal. No scleral icterus.  Neck: Normal range of motion, full passive range of motion without pain and phonation normal. Neck supple. No spinous process tenderness and no muscular tenderness present. No thyromegaly present.  Cardiovascular: Normal rate and regular rhythm.  Murmur (heard at the LEFT upper sternal border) heard.  Systolic murmur is present with a grade of 2/6. Pulses:      Carotid pulses are on the left side with bruit.      Radial pulses are 2+ on the right side, and 2+ on the left side.  Pulmonary/Chest: Effort normal and breath sounds normal.  Lymphadenopathy:       Head (right side): No tonsillar, no preauricular, no posterior auricular and no occipital adenopathy present.       Head (left side): No tonsillar, no preauricular, no posterior auricular and no occipital adenopathy present.    She has no cervical adenopathy.       Right: No supraclavicular adenopathy present.       Left: No supraclavicular adenopathy present.  Neurological: She is alert and oriented to person, place, and time. No sensory deficit.  Skin: Skin is warm, dry and intact. No rash noted. No cyanosis or erythema. Nails show no clubbing.  Psychiatric: She has a normal mood and affect. Her speech is normal and behavior is normal.   EKG reviewed with Dr. Alvy BimlerSagardia.  Rate is regular at 69 bpm.  Downgoing T waves in leads V1, V2, V3, V4, V5.  Flat T wave in V6.  Compared to previous tracing in 2014 and this is essentially unchanged.    Wt Readings from  Last 3 Encounters:  12/18/17 108 lb 9.6 oz (49.3 kg)  12/15/17 108 lb 3.2 oz (49.1 kg)  07/02/17 111 lb 9.6 oz (50.6 kg)    Results for orders placed or performed in visit on 12/18/17  POCT CBC  Result Value Ref Range   WBC 9.3 4.6 - 10.2 K/uL   Lymph, poc 2.2 0.6 - 3.4   POC LYMPH PERCENT 23.7 10 - 50 %L   MID (cbc) 0.7 0 - 0.9   POC MID % 7.2 0 - 12 %M   POC Granulocyte 6.4 2 - 6.9   Granulocyte percent 69.1 37 - 80 %G   RBC 4.46 4.04 - 5.48 M/uL   Hemoglobin 12.7 12.2 - 16.2 g/dL   HCT, POC 84.638.9 96.237.7 - 47.9 %   MCV 87.2 80 - 97 fL   MCH, POC  28.5 27 - 31.2 pg   MCHC 32.7 31.8 - 35.4 g/dL   RDW, POC 16.1 %   Platelet Count, POC 362 142 - 424 K/uL   MPV 7.0 0 - 99.8 fL  POCT glucose (manual entry)  Result Value Ref Range   POC Glucose 79 70 - 99 mg/dl  POCT glycosylated hemoglobin (Hb A1C)  Result Value Ref Range   Hemoglobin A1C 5.5   POCT urinalysis dipstick  Result Value Ref Range   Color, UA yellow yellow   Clarity, UA clear clear   Glucose, UA negative negative mg/dL   Bilirubin, UA negative negative   Ketones, POC UA negative negative mg/dL   Spec Grav, UA 0.960 4.540 - 1.025   Blood, UA negative negative   pH, UA 6.5 5.0 - 8.0   Protein Ur, POC =100 (A) negative mg/dL   Urobilinogen, UA 0.2 0.2 or 1.0 E.U./dL   Nitrite, UA Negative Negative   Leukocytes, UA Trace (A) Negative  POCT Microscopic Urinalysis ([facility])  Result Value Ref Range   WBC,UR,HPF,POC None None WBC/hpf   RBC,UR,HPF,POC None None RBC/hpf   Bacteria Few (A) None, Too numerous to count   Mucus Absent Absent   Epithelial Cells, UR Per Microscopy Few (A) None, Too numerous to count cells/hpf       Assessment & Plan:   Problem List Items Addressed This Visit    Hyperlipidemia    Await lipid profile.  No previous results found in her record.  Would be reluctant to initiate statin therapy in a patient of this age.      Relevant Orders   Comprehensive metabolic panel   Lipid panel    HYPERTENSION, BENIGN ESSENTIAL    Controlled here today.  I suspect that her fright associated with her nocturnal dizziness is the cause of the elevated blood pressure rather than the pressure causing the dizziness.  Continue amlodipine at current dose.  It is okay to deliver 2.5 mg amlodipine as needed for elevated blood pressure.      Relevant Orders   POCT CBC (Completed)   POCT urinalysis dipstick (Completed)   EKG 12-Lead (Completed)   Comprehensive metabolic panel   TSH   Ambulatory referral to Cardiology   Diabetes mellitus type 2, controlled, without complications (HCC)    Well-controlled on current regimen of glimepiride 1 mg daily.  No changes.      Relevant Orders   POCT glucose (manual entry) (Completed)   POCT glycosylated hemoglobin (Hb A1C) (Completed)   POCT urinalysis dipstick (Completed)   Comprehensive metabolic panel   Cardiac murmur    Not previously noted on exam.  EKG with nonspecific T wave changes.  Refer to cardiology for additional evaluation.      Relevant Orders   Ambulatory referral to Cardiology   Left carotid bruit    Not noted on previous examination.  Refer to cardiology for additional evaluation of undiagnosed murmur.  Likely needs carotid artery Dopplers in addition to echocardiogram.  Start daily aspirin 81 mg.      Relevant Orders   Ambulatory referral to Cardiology    Other Visit Diagnoses    Dizziness    -  Primary   Unclear etiology.  Recent respiratory illness suggests possible inner ear issue, but unusual that symptoms only occur during the night.  Trial of meclizine QHS.   Relevant Medications   meclizine (ANTIVERT) 12.5 MG tablet   Other Relevant Orders   POCT CBC (Completed)   POCT glucose (manual  entry) (Completed)   POCT urinalysis dipstick (Completed)   POCT Microscopic Urinalysis ([facility]) (Completed)   EKG 12-Lead (Completed)   Comprehensive metabolic panel   TSH   Nonspecific abnormal electrocardiogram (ECG) (EKG)        Unchanged from 2014.  Given hypertension, diabetes, hyperlipidemia it is likely that she has underlying coronary artery disease.   Relevant Orders   Ambulatory referral to Cardiology       Return in about 4 weeks (around 01/15/2018) for re-evaluation of blood pressure, dizziness.   Fernande Brashelle S. Lorita Forinash, PA-C Primary Care at [facility]

## 2017-12-18 NOTE — Assessment & Plan Note (Signed)
Not previously noted on exam.  EKG with nonspecific T wave changes.  Refer to cardiology for additional evaluation.

## 2017-12-18 NOTE — Patient Instructions (Addendum)
CONTINUE your current medications. START the meclizine at bedtime, to help prevent the dizziness. START a daily aspirin 81 mg. The cardiology office will call you to schedule an evaluation.    IF you received an x-ray today, you will receive an invoice from [facility]. Please contact [facility] at [phone] with questions or concerns regarding your invoice.   IF you received labwork today, you will receive an invoice from [facility]. Please contact [facility] at [phone] with questions or concerns regarding your invoice.   Our billing staff will not be able to assist you with questions regarding bills from these companies.  You will be contacted with the lab results as soon as they are available. The fastest way to get your results is to activate your My Chart account. Instructions are located on the last page of this paperwork. If you have not heard from us regarding the results in 2 weeks, please contact this office.

## 2017-12-18 NOTE — Assessment & Plan Note (Signed)
Not noted on previous examination.  Refer to cardiology for additional evaluation of undiagnosed murmur.  Likely needs carotid artery Dopplers in addition to echocardiogram.  Start daily aspirin 81 mg.

## 2017-12-18 NOTE — Assessment & Plan Note (Signed)
Controlled here today.  I suspect that her fright associated with her nocturnal dizziness is the cause of the elevated blood pressure rather than the pressure causing the dizziness.  Continue amlodipine at current dose.  It is okay to deliver 2.5 mg amlodipine as needed for elevated blood pressure.

## 2017-12-18 NOTE — Assessment & Plan Note (Signed)
Well-controlled on current regimen of glimepiride 1 mg daily.  No changes.

## 2017-12-18 NOTE — Assessment & Plan Note (Signed)
Await lipid profile.  No previous results found in her record.  Would be reluctant to initiate statin therapy in a patient of this age.

## 2017-12-19 LAB — COMPREHENSIVE METABOLIC PANEL
A/G RATIO: 1.6 (ref 1.2–2.2)
ALT: 14 IU/L (ref 0–32)
AST: 21 IU/L (ref 0–40)
Albumin: 4.7 g/dL (ref 3.5–4.7)
Alkaline Phosphatase: 85 IU/L (ref 39–117)
BILIRUBIN TOTAL: 0.3 mg/dL (ref 0.0–1.2)
BUN / CREAT RATIO: 18 (ref 12–28)
BUN: 16 mg/dL (ref 8–27)
CALCIUM: 9.4 mg/dL (ref 8.7–10.3)
CHLORIDE: 97 mmol/L (ref 96–106)
CO2: 25 mmol/L (ref 20–29)
Creatinine, Ser: 0.89 mg/dL (ref 0.57–1.00)
GFR, EST AFRICAN AMERICAN: 69 mL/min/{1.73_m2} (ref >59)
GFR, EST NON AFRICAN AMERICAN: 60 mL/min/{1.73_m2} (ref >59)
GLOBULIN, TOTAL: 3 g/dL (ref 1.5–4.5)
Glucose: 92 mg/dL (ref 65–99)
POTASSIUM: 3.4 mmol/L — AB (ref 3.5–5.2)
SODIUM: 137 mmol/L (ref 134–144)
TOTAL PROTEIN: 7.7 g/dL (ref 6.0–8.5)

## 2017-12-19 LAB — LIPID PANEL
CHOL/HDL RATIO: 4.8 ratio — AB (ref 0.0–4.4)
Cholesterol, Total: 235 mg/dL — ABNORMAL HIGH (ref 100–199)
HDL: 49 mg/dL (ref >39)
LDL Calculated: 106 mg/dL — ABNORMAL HIGH (ref 0–99)
Triglycerides: 398 mg/dL — ABNORMAL HIGH (ref 0–149)
VLDL Cholesterol Cal: 80 mg/dL — ABNORMAL HIGH (ref 5–40)

## 2017-12-19 LAB — TSH: TSH: 1.07 u[IU]/mL (ref 0.450–4.500)

## 2017-12-21 ENCOUNTER — Encounter: Payer: Self-pay | Admitting: Physician Assistant

## 2017-12-22 ENCOUNTER — Ambulatory Visit: Payer: Medicare Other | Admitting: Emergency Medicine

## 2017-12-27 ENCOUNTER — Ambulatory Visit (INDEPENDENT_AMBULATORY_CARE_PROVIDER_SITE_OTHER): Payer: Medicare Other | Admitting: Family Medicine

## 2017-12-27 ENCOUNTER — Encounter: Payer: Self-pay | Admitting: Family Medicine

## 2017-12-27 ENCOUNTER — Other Ambulatory Visit: Payer: Self-pay

## 2017-12-27 VITALS — BP 126/66 | HR 79 | Temp 98.0°F | Resp 17 | Ht 59.0 in | Wt 109.2 lb

## 2017-12-27 DIAGNOSIS — H8111 Benign paroxysmal vertigo, right ear: Secondary | ICD-10-CM

## 2017-12-27 DIAGNOSIS — R42 Dizziness and giddiness: Secondary | ICD-10-CM

## 2017-12-27 DIAGNOSIS — Z5181 Encounter for therapeutic drug level monitoring: Secondary | ICD-10-CM

## 2017-12-27 DIAGNOSIS — I10 Essential (primary) hypertension: Secondary | ICD-10-CM

## 2017-12-27 MED ORDER — ONDANSETRON 4 MG PO TBDP: 4.0000 mg | ORAL_TABLET | Freq: Once | ORAL | Status: DC

## 2017-12-27 NOTE — Patient Instructions (Addendum)
Ch? u?ng m?t vin amlopidipine m?i ngy  m?t y t ch?m [place] s?c kh?e t?i nh v nh tr? li?u v?t l s? ??n nh ?? ki?m tra vo ngy mai ho?c th? t? c?a b?n  ??ng li xe. U?ng nhi?u n??c.  B?n b? chng m?t. M?t tnh tr?ng gy chng m?t nghim tr?ng v y?u.     IF you received an x-ray today, you will receive an invoice from [facility]. Please contact [facility] at [phone] with questions or concerns regarding your invoice.   IF you received labwork today, you will receive an invoice from [facility]. Please contact [facility] at [phone] with questions or concerns regarding your invoice.   Our billing staff will not be able to assist you with questions regarding bills from these companies.  You will be contacted with the lab results as soon as they are available. The fastest way to get your results is to activate your My Chart account. Instructions are located on the last page of this paperwork. If you have not heard from [place] regarding the results in 2 weeks, please contact this office.    Chng m?t Vertigo Chng m?t l m?t c?m gic qu v? ho?c nh?ng v?t xung quanh qu v? chuy?n ??ng trong khi th?c t? khng ph?i nh? v?y. Chng m?t c th? nguy hi?m n?u n x?y ra khi qu v? ?ang lm g ? c th? gy nguy hi?m cho b?n thn ho?c cho ng??i khc, ch?ng h?n nh? li xe. Nguyn nhn g gy ra? Tnh tr?ng ny x?y ra do r?i lo?n cc tn hi?u ???c g?i t? cc h? th?ng c?m gic c?a c? th? ??n no c?a qu v?. Cc nguyn nhn khc nhau c?a s? r?i lo?n d?n ??n chng m?t bao g?m:  Nhi?m trng, ??c bi?t l ? tai trong.  M?t ph?n ?ng c h?i v?i thu?c ho?c l?m d?ng r??u v thu?c.  Cai ma ty ho?c r??u.  Thay ??i t? th? nhanh, nh? khi ?ang n?m xu?ng ho?c l?n mnh trn gi??ng.  ?au n?a ??u.  Gi?m dng mu t?i no.  Huy?t p gi?m.  T?ng p su?t trong no do m?t ch?n th??ng ? ??u ho?c c?, ??t qu?, nhi?m trng, kh?i u ho?c ch?y mu.  Cc b?nh l ? h? th?n kinh trung ??ng.  Cc  d?u hi?u ho?c tri?u ch?ng l g? Cc tri?u ch?ng c?a tnh tr?ng ny th??ng x?y ra khi qu v? c? ??ng ??u ho?c m?t v? cc h??ng khc nhau. Cc tri?u ch?ng c th? b?t ??u ??t ng?t v chng th??ng ko di ch?a ??n m?t pht. Tri?u ch?ng c th? bao g?m:  M?t th?ng b?ng v ng.  C c?m gic nh? qu v? ?ang quay ho?c chuy?n ??ng.  C c?m gic nh? nh?ng v?t xung quanh ?ang quay ho?c chuy?n ??ng.  Bu?n nn v nn.  Nhn m? ho?c nhn m?t thnh hai.  Kh nghe.  Ni l?p b?p.  Chng m?t.  C? ??ng m?t ngoi  mu?n (rung gi?t c?u m?t).  Cc tri?u ch?ng c th? nh? v ch? gy m?t cht kh ch?u, ho?c c th? n?ng v ?nh h??ng ??n cu?c s?ng hng ngy. Cc c?n chng m?t c th? quay tr? l?i (ti pht) theo th?i gian v th??ng kh?i pht do m?t s? c? ??ng nh?t ??nh. Cc tri?u ch?ng c th? c?i thi?n theo th?i gian. Ch?n ?on tnh tr?ng ny nh? th? no? Tnh tr?ng ny c th? ???c ch?n ?on d?a vo khai thc b?nh  s? v tnh tr?ng rung gi?t c?u m?t. Chuyn gia ch?m [place] s?c kh?e c th? ki?m tra c? ??ng c?a m?t b?ng cch ?? ngh? qu v? thay ??i t? th? nhanh chng ?? lm kh?i pht rung gi?t c?u m?t. Ki?m tra ny ???c g?i l nghi?m php [place], nghi?m php l?c l? ??u ho?c nghi?m php l?c ngang. Qu v? c th? ???c gi?i thi?u ??n m?t chuyn gia ch?m [place] s?c kh?e chuyn v? cc v?n ?? tai, m?i, h?ng (ENT) bc s? tai m?i h?ng) ho?c m?t chuyn gia v? cc r?i lo?n c?a h? th?n kinh trung ??ng (bc s? chuyn khoa th?n kinh). Qu v? c th? ???c lm cc ki?m tra khc, bao g?m:  Khm th?c th?.  Xt nghi?m mu.  Ch?p MRI.  Ch?p CT.  ?i?n tm ?? (ECG). Ki?m tra ny ghi l?i ho?t ??ng ?i?n c?a tim qu v?.  ?i?n no ?? (EEG). Ki?m tra ny ghi l?i ho?t ??ng ?i?n c?a no qu v?.  Ki?m tra thnh l?c.  Tnh tr?ng ny ???c ?i?u tr? nh? th? no? Vi?c ?i?u tr? b?nh ny ty thu?c vo nguyn nhn v m?c ?? n?ng c?a cc tri?u ch?ng. Cc ty ch?n ?i?u tr? bao g?m:  Thu?c ?i?u tr? bu?n nn ho?c chng m?t. Nh?ng thu?c ny th??ng  ???c s? d?ng trong nh?ng tr??ng h?p n?ng. M?t s? lo?i thu?c ???c s? d?ng ?? ?i?u tr? cc b?nh khc c?ng c th? gi?m ho?c lm h?t cc tri?u ch?ng chng m?t. Nh?ng th?c ph?m ny bao g?m: ? Thu?c ki?m sot d? ?ng (thu?c khng histamine). ? Thu?c ki?m sot cc c?n co gi?t (thu?c ch?ng co gi?t). ? Thu?c lm gi?m tr?m c?m (thu?c ch?ng tr?m c?m). ? Thu?c lm gi?m lo l?ng (thu?c an th?n).  C? ??ng ??u ?? ?i?u ch?nh tai trong c?a qu v? tr? l?i bnh th??ng. N?u chng m?t do m?t v?n ?? v? tai gy ra, chuyn gia ch?m [place] s?c kh?e c?a qu v? c th? khuy?n ngh? m?t s? c? ??ng ?? kh?c ph?c v?n ??.  Ph?u thu?t. V?n ?? ny hi?m khi x?y ra.  Tun th? nh?ng h??ng d?n ny ? nh: An ton  Di chuy?n th?t ch?m. Trnh nh?ng c? ??ng thn ng??i ho?c c? ??ng ??u ??t ng?t.  Trnh li xe.  Trnh v?n hnh my mc h?ng n?ng.  Trnh th?c hi?n cc nhi?m v? c th? gy nguy hi?m cho b?n thn ho?c ng??i khc n?u qu v? b? chng m?t trong khi th?c hi?n nhi?m v? ?.  N?u qu v? kh ?i l?i ho?c kh gi? th?ng b?ng, th? dng m?t cy g?y ?? cn b?ng. N?u qu v? c?m th?y chng m?t ho?c khng cn b?ng, hy ng?i xu?ng ngay.  Tr? l?i sinh ho?t bnh th??ng theo ch? d?n c?a chuyn gia ch?m [place] s?c kh?e. H?i chuyn gia ch?m [place] s?c kh?e v? cc ho?t ??ng no an ton cho qu v?. H??ng d?n chung  Ch? s? d?ng thu?c khng k ??n v thu?c k ??n theo ch? d?n c?a chuyn gia ch?m [place] s?c kh?e.  Hessie Diener m?t s? t? th? ho?c c? ??ng nh?t ??nh nh? h??ng d?n c?a chuyn gia ch?m [place] s?c kh?e.  U?ng ?? n??c ?? gi? cho n??c ti?u trong ho?c c mu vng nh?t.  Tun th? t?t c? cc l?n khm theo di theo ch? d?n c?a chuyn gia ch?m [place] s?c kh?e. ?i?u ny c vai tr quan tr?ng. Hy lin l?c v?i chuyn gia ch?m [place] s?c kh?e  n?u:  Thu?c khng lm gi?m tnh tr?ng chng m?c ho?c lm tnh tr?ng tr?m tr?ng h?n.  Qu v? b? s?t.  Tnh tr?ng c?a qu v? tr?m tr?ng h?n ho?c qu v? c nh?ng tri?u ch?ng m?i.  Gia ?nh v b?n b qu v? nh?n th?y qu v? c  nh?ng thay ??i v? hnh vi.  Tnh tr?ng bu?n nn v nn m?a tr?m tr?ng h?n.  Qu v? b? t b ho?c c?m gic "r?n r?n nh? ki?n b" ? m?t vng c? th?. Yu c?u tr? gip ngay l?p t?c n?u:  Qu v? kh c? ??ng ho?c kh ni.  Qu v? lun b? chng m?t.  Qu v? b? ng?t.  Qu v? b? ?au ??u r?t nhi?u.  Qu v? b? y?u ? bn tay, cnh tay ho?c chn.  Qu v? b? thay ??i thnh l?c ho?c th? l?c.  Qu v? b? c?ng c?.  Qu v? b? nh?y c?m v?i nh sng. Thng tin ny khng nh?m m?c ?ch thay th? cho l?i khuyn m chuyn gia ch?m [place] s?c kh?e ni v?i qu v?. Hy b?o ??m qu v? ph?i th?o lu?n b?t k? v?n ?? g m qu v? c v?i chuyn gia ch?m [place] s?c kh?e c?a qu v?. Document Released: 12/07/2005 Document Revised: 02/21/2017 Document Reviewed: 04/01/2015 Elsevier Interactive Patient Education  2018 [employer].

## 2017-12-27 NOTE — Addendum Note (Signed)
Addended by: Collie SiadSTALLINGS, Dylann Layne A on: 12/27/2017 03:53 PM   Modules accepted: Orders

## 2017-12-27 NOTE — Progress Notes (Addendum)
Chief Complaint  Patient presents with  . blood pressure issues, pt of Robin Andrews's    pt is c/o dizziness and unable to stand without feeling like she is going to fall, unable to lie flat has to lie down on side, feels like things are spinning when she tries to lie down, very fatigued and no children here to help her.  BP's very high in the morning and evenings.  Dec 25, dizziness so bad makes pt feel like she doesn't want to live.    HPI    Pt is a 86y who lifes alone on the 2nd floor She has been having dizziness Using a translator [id number] the following history was attained She is here with her Robin Andrews [phone]   Patient reports that she has been feeling week Her amlodipine was dose at 5mg  once daily When she feels weak and dizzy she has been checking her bp Her readings were 175-187 She states that she would take an extra dose of amlodipine She then would feel like the ceiling is falling on her and get worsening weakness and dizziness She states that she lives alone and gets scared She has a friend here with her but he lives far away and does not drive She has not been taking the meclizine She has not vomited It seems better when she curls up on her side on the left She has not fallen at home  Last seen 12/18/2017 by Porfirio Oarhelle Jeffery Who performed a work up and she was found to have vertigo. Work up included ECG EKG reviewed with Dr. Alvy BimlerSagardia.  Rate is regular at 69 bpm.  Downgoing T waves in leads V1, V2, V3, V4, V5.  Flat T wave in V6.  Compared to previous tracing in 2014 and this is essentially unchanged.    Past Medical History:  Diagnosis Date  . Degenerative disc disease   . Diabetes mellitus without complication (HCC)   . Hypertension   . Pruritus     Current Outpatient Medications  Medication Sig Dispense Refill  . amLODipine (NORVASC) 5 MG tablet Take 1 tablet (5 mg total) by mouth daily. 60 tablet 6  . glimepiride (AMARYL) 1 MG tablet Take 1 tablet (1 mg  total) by mouth daily with breakfast. 60 tablet 6  . hydrOXYzine (ATARAX/VISTARIL) 25 MG tablet Take 0.5-1 tablets (12.5-25 mg total) by mouth every 8 (eight) hours as needed for itching. 60 tablet 3  . meclizine (ANTIVERT) 12.5 MG tablet Take 1-2 tablets (12.5-25 mg total) by mouth at bedtime. 30 tablet 0  . traMADol-acetaminophen (ULTRACET) 37.5-325 MG tablet Take 0.5-1 tablets by mouth every 8 (eight) hours as needed. 90 tablet 1   Current Facility-Administered Medications  Medication Dose Route Frequency Provider Last Rate Last Dose  . ondansetron (ZOFRAN-ODT) disintegrating tablet 4 mg  4 mg Oral Once Doristine BosworthStallings, Lanai Conlee A, MD        Allergies:  Allergies  Allergen Reactions  . Lisinopril     Past Surgical History:  Procedure Laterality Date  . DILATION AND CURETTAGE, DIAGNOSTIC / THERAPEUTIC    . NECK SURGERY      Social History   Socioeconomic History  . Marital status: Widowed    Spouse name: None  . Number of children: None  . Years of education: None  . Highest education level: None  Social Needs  . Financial resource strain: None  . Food insecurity - worry: None  . Food insecurity - inability: None  . Transportation needs - medical:  None  . Transportation needs - non-medical: None  Occupational History  . None  Tobacco Use  . Smoking status: Never Smoker  . Smokeless tobacco: Never Used  Substance and Sexual Activity  . Alcohol use: No  . Drug use: No  . Sexual activity: No    Birth control/protection: Abstinence  Other Topics Concern  . None  Social History Narrative  . None    No family history on file.   ROS Review of Systems See HPI Constitution: No fevers or chills No malaise No diaphoresis Skin: No rash or itching Eyes: no blurry vision, no double vision GU: no dysuria or hematuria Neuro: no dizziness or headaches all others reviewed and negative   Objective: Vitals:   12/27/17 1322  BP: 126/66  Pulse: 79  Resp: 17  Temp: 98 F  (36.7 C)  TempSrc: Oral  SpO2: 96%  Weight: 109 lb 3.2 oz (49.5 kg)  Height: 4\' 11"  (1.499 m)    Physical Exam  Constitutional: She is oriented to person, place, and time. She appears well-developed and well-nourished.  HENT:  Head: Normocephalic and atraumatic.  Eyes: Conjunctivae and EOM are normal.  Cardiovascular: Normal rate, regular rhythm and normal heart sounds.  No murmur heard. Pulmonary/Chest: Effort normal and breath sounds normal. No stridor. No respiratory distress. She has no wheezes.  Neurological: She is alert and oriented to person, place, and time.  Skin: Skin is warm. Capillary refill takes less than 2 seconds.  Psychiatric: She has a normal mood and affect. Her behavior is normal. Judgment and thought content normal.  Panics when she changes position    NYSTAGMUS WHEN HEAD WAS TILTED TO THE RIGHT DURING DIX HALL PIKE (symptoms worse when she turns to the right) With [facility] PERFOR[facility] BY [facility] the patient felt like her symptoms improved slightly  Assessment and Plan Devi was seen today for blood pressure issues, pt of Robin Andrews's.  Diagnoses and all orders for this visit:  HYPERTENSION, BENIGN ESSENTIAL -     AMB Referral to [facility] Care Management  Dizziness -     ondansetron (ZOFRAN-ODT) disintegrating tablet 4 mg -     AMB Referral to [facility] Care Management  Medication monitoring encounter -     AMB Referral to [facility] Care Management  Benign paroxysmal positional vertigo of right ear -     ondansetron (ZOFRAN-ODT) disintegrating tablet 4 mg -     AMB Referral to [facility] Care Management   Concern that this patient has been overmedicating with amlodipine  Will send home health to check bp and evaluate home bp cuff Pt to stop taking amlodipine for dizziness and only take once a day Continue eating and drinking She does not understand what vertigo is so time was spent teaching her about fall prevention and vertigo Reviewed all her medications and  showed her which medication to take and marked it on her bottle.   A total of 50 minutes were spent face-to-face with the patient during this encounter and over half of that time was spent on counseling and coordination of care.   Tarshia Kot A Rajon Bisig

## 2017-12-30 ENCOUNTER — Other Ambulatory Visit: Payer: Self-pay | Admitting: *Deleted

## 2017-12-30 NOTE — Patient Outreach (Addendum)
[facility]) Care Management  12/30/2017  Katrina Stackhong T Nolde 01/28/1931 [id number]  Telephone Screen  Referral Date: 12/27/17 Referral Source: MD referral (Dr. Porfirio Oarhelle Jeffery) Referral Reason: Vertigo, blood pressure check, fall risk Insurance: Medicare A & B  Outreach attempt #1 to patient. No answer. RN CM left HIPAA compliant message along with contact info.    Plan: RN CM will contact patient within one week.  Wynelle ClevelandJuanita Hamish Banks, RN, BSN, MHA/MSL, [facility] Telephonic Care Manager Coordinator [facility] Direct Phone: [phone] Cell Phone: [phone] Toll Free: [phone] Fax: [phone]

## 2018-01-03 ENCOUNTER — Other Ambulatory Visit: Payer: Self-pay

## 2018-01-03 ENCOUNTER — Ambulatory Visit (INDEPENDENT_AMBULATORY_CARE_PROVIDER_SITE_OTHER): Payer: Medicare Other

## 2018-01-03 ENCOUNTER — Encounter: Payer: Self-pay | Admitting: Physician Assistant

## 2018-01-03 ENCOUNTER — Ambulatory Visit (INDEPENDENT_AMBULATORY_CARE_PROVIDER_SITE_OTHER): Payer: Medicare Other | Admitting: Physician Assistant

## 2018-01-03 VITALS — BP 134/77 | HR 71 | Temp 98.1°F | Resp 18 | Ht 59.84 in | Wt 107.4 lb

## 2018-01-03 DIAGNOSIS — R05 Cough: Secondary | ICD-10-CM | POA: Diagnosis not present

## 2018-01-03 DIAGNOSIS — J209 Acute bronchitis, unspecified: Secondary | ICD-10-CM | POA: Diagnosis not present

## 2018-01-03 DIAGNOSIS — R059 Cough, unspecified: Secondary | ICD-10-CM

## 2018-01-03 LAB — POCT CBC
GRANULOCYTE PERCENT: 62.3 % (ref 37–80)
HCT, POC: 38.4 % (ref 37.7–47.9)
Hemoglobin: 12.9 g/dL (ref 12.2–16.2)
Lymph, poc: 2.7 (ref 0.6–3.4)
MCH: 28.8 pg (ref 27–31.2)
MCHC: 33.7 g/dL (ref 31.8–35.4)
MCV: 85.4 fL (ref 80–97)
MID (CBC): 1.1 — AB (ref 0–0.9)
MPV: 7.2 fL (ref 0–99.8)
PLATELET COUNT, POC: 380 10*3/uL (ref 142–424)
POC Granulocyte: 6.2 (ref 2–6.9)
POC LYMPH %: 27 % (ref 10–50)
POC MID %: 10.7 %M (ref 0–12)
RBC: 4.5 M/uL (ref 4.04–5.48)
RDW, POC: 11.9 %
WBC: 10 10*3/uL (ref 4.6–10.2)

## 2018-01-03 MED ORDER — BENZONATATE 100 MG PO CAPS: 100.0000 mg | ORAL_CAPSULE | Freq: Three times a day (TID) | ORAL | 0 refills | Status: DC | PRN

## 2018-01-03 MED ORDER — DOXYCYCLINE HYCLATE 100 MG PO CAPS: 100.0000 mg | ORAL_CAPSULE | Freq: Two times a day (BID) | ORAL | 0 refills | Status: DC

## 2018-01-03 NOTE — Patient Instructions (Addendum)
Your chest xray did not show any active lung infection. However, due to duration of symptoms. We will treat for underlying bacterial etiology at this time. Please take antibiotic as prescribed.  I have given prescription for Tessalon Perles to use throughout the day to suppress cough.  If symptoms persist after 7-10 days, please follow-up with PCP.  If symptoms worsen or she develops new concerning symptoms, seek care sooner.  Cough, Adult A cough helps to clear your throat and lungs. A cough may last only 2-3 weeks (acute), or it may last longer than 8 weeks (chronic). Many different things can cause a cough. A cough may be a sign of an illness or another medical condition. Follow these instructions at home:  Pay attention to any changes in your cough.  Take medicines only as told by your doctor. ? If you were prescribed an antibiotic medicine, take it as told by your doctor. Do not stop taking it even if you start to feel better. ? Talk with your doctor before you try using a cough medicine.  Drink enough fluid to keep your pee (urine) clear or pale yellow.  If the air is dry, use a cold steam vaporizer or humidifier in your home.  Stay away from things that make you cough at work or at home.  If your cough is worse at night, try using extra pillows to raise your head up higher while you sleep.  Do not smoke, and try not to be around smoke. If you need help quitting, ask your doctor.  Do not have caffeine.  Do not drink alcohol.  Rest as needed. Contact a doctor if:  You have new problems (symptoms).  You cough up yellow fluid (pus).  Your cough does not get better after 2-3 weeks, or your cough gets worse.  Medicine does not help your cough and you are not sleeping well.  You have pain that gets worse or pain that is not helped with medicine.  You have a fever.  You are losing weight and you do not know why.  You have night sweats. Get help right away if:  You cough  up blood.  You have trouble breathing.  Your heartbeat is very fast. This information is not intended to replace advice given to you by your health care provider. Make sure you discuss any questions you have with your health care provider. Document Released: 08/20/2011 Document Revised: 05/14/2016 Document Reviewed: 02/13/2015 Elsevier Interactive Patient Education  2018 [employer]    IF you received an x-ray today, you will receive an invoice from [facility]. Please contact [facility] at [phone] with questions or concerns regarding your invoice.   IF you received labwork today, you will receive an invoice from [facility]. Please contact [facility] at [phone] with questions or concerns regarding your invoice.   Our billing staff will not be able to assist you with questions regarding bills from these companies.  You will be contacted with the lab results as soon as they are available. The fastest way to get your results is to activate your My Chart account. Instructions are located on the last page of this paperwork. If you have not heard from us regarding the results in 2 weeks, please contact this office.

## 2018-01-03 NOTE — Progress Notes (Signed)
MRN: [id number] DOB: 09/24/1931  Subjective:   Robin Andrews is a 84 y.o. female presenting for chief complaint of Hypertension; Cough (X 3 weeks- pt grandson states pt had fever ); and Fatigue (X 2 weeks) .  Reports 3 week history of productive cough (no hemoptysis), fever, chills, and sore throat. Has some fatigue. Denies chest pain, SOB,  ear pain and wheezing, night sweats, nausea, vomiting, abdominal pain and diarrhea. She has been seen here multiple times for this in office. Has been prescribed augmentin, which she reports to her grandson (who is translating) that she just threw it away and did not take. She has tried advil gel for cough with no relief.  Has had sick contact with family members. Has history of seasonal allergies, no history of asthma. Patient has had flu shot this season. Denies smoking. She lives at assisted living facility. Denies recent travel or hospitalizations. Of note, grandson is present to translate.   Review of Systems  Respiratory: Negative for hemoptysis.   Cardiovascular: Negative for palpitations, orthopnea and leg swelling.  Neurological: Negative for dizziness.    Fuller Songhong has a current medication list which includes the following prescription(s): amlodipine, glimepiride, meclizine, hydroxyzine, and tramadol-acetaminophen, and the following Facility-Administered Medications: ondansetron. Also is allergic to lisinopril.  Fuller Songhong  has a past medical history of Degenerative disc disease, Diabetes mellitus without complication (HCC), Hypertension, and Pruritus. Also  has a past surgical history that includes Neck surgery and Dilation and curettage, diagnostic / therapeutic.   Objective:   Vitals: BP 134/77 (BP Location: Left Arm, Patient Position: Sitting, Cuff Size: Normal)   Pulse 71   Temp 98.1 F (36.7 C) (Oral)   Resp (!) 22   Ht 4' 11.84" (1.52 m)   Wt 107 lb 6.4 oz (48.7 kg)   SpO2 99%   BMI 21.09 kg/m   Physical Exam  Constitutional: She is  oriented to person, place, and time. She appears well-developed and well-nourished. No distress.  HENT:  Head: Normocephalic and atraumatic.  Nose: Mucosal edema present. Right sinus exhibits no maxillary sinus tenderness and no frontal sinus tenderness. Left sinus exhibits no maxillary sinus tenderness and no frontal sinus tenderness.  Mouth/Throat: Uvula is midline, oropharynx is clear and moist and mucous membranes are normal. No tonsillar exudate.  Eyes: Conjunctivae are normal.  Neck: Normal range of motion.  Cardiovascular: Normal rate and regular rhythm.  Murmur (LUSB) heard.  Systolic murmur is present. Pulmonary/Chest: Effort normal and breath sounds normal. She has no wheezes. She has no rhonchi. She has no rales.  Lymphadenopathy:       Head (right side): No submental, no submandibular, no tonsillar, no preauricular, no posterior auricular and no occipital adenopathy present.       Head (left side): No submental, no submandibular, no tonsillar, no preauricular, no posterior auricular and no occipital adenopathy present.    She has no cervical adenopathy.       Right: No supraclavicular adenopathy present.       Left: No supraclavicular adenopathy present.  Neurological: She is alert and oriented to person, place, and time.  Skin: Skin is warm and dry.  Psychiatric: She has a normal mood and affect.  Vitals reviewed.   Results for orders placed or performed in visit on 01/03/18 (from the past 24 hour(s))  POCT CBC     Status: Abnormal   Collection Time: 01/03/18  5:57 PM  Result Value Ref Range   WBC 10.0 4.6 - 10.2 K/uL  Lymph, poc 2.7 0.6 - 3.4   POC LYMPH PERCENT 27.0 10 - 50 %L   MID (cbc) 1.1 (A) 0 - 0.9   POC MID % 10.7 0 - 12 %M   POC Granulocyte 6.2 2 - 6.9   Granulocyte percent 62.3 37 - 80 %G   RBC 4.50 4.04 - 5.48 M/uL   Hemoglobin 12.9 12.2 - 16.2 g/dL   HCT, POC 96.0 45.4 - 47.9 %   MCV 85.4 80 - 97 fL   MCH, POC 28.8 27 - 31.2 pg   MCHC 33.7 31.8 - 35.4  g/dL   RDW, POC 09.8 %   Platelet Count, POC 380 142 - 424 K/uL   MPV 7.2 0 - 99.8 fL   Dg Chest 2 View  Result Date: 01/03/2018 CLINICAL DATA:  Productive cough for 3 weeks, fever EXAM: CHEST  2 VIEW COMPARISON:  06/01/2013 FINDINGS: Mild enlargement of cardiac silhouette. Atherosclerotic calcification and tortuosity of thoracic aorta. Mediastinal contours and pulmonary vascularity otherwise normal. Lungs clear. No pulmonary infiltrate, pleural effusion or pneumothorax. Prior cervical spine fusion. No acute osseous findings. IMPRESSION: Mild enlargement of cardiac silhouette. Aortic atherosclerosis. No acute abnormalities. Electronically Signed   By: Ulyses Southward M.D.   On: 01/03/2018 18:03    Assessment and Plan :  1. Cough - DG Chest 2 View; Future - POCT CBC 2. Acute bronchitis, unspecified organism Hx and PE findings consistent with acute bronchitis. WBC wnl. CXR shows incidental mild enlargement of cardiac silhouette but no acute pulmonary findings. Pt denies chest pain and SOB. No LE edema noted on exam. She is overall well appearing, in no distress. Vitals are stable. Due to duration of cough, will cover for potential underlying bacterial etiology at this time. She apparently threw away the last abx she was prescribed for this. Advised her to take this one and use tessalon perles prn. Return to clinic if symptoms worsen, do not improve in 7-10 days, or as needed.  - doxycycline (VIBRAMYCIN) 100 MG capsule; Take 1 capsule (100 mg total) by mouth 2 (two) times daily.  Dispense: 20 capsule; Refill: 0 - benzonatate (TESSALON) 100 MG capsule; Take 1-2 capsules (100-200 mg total) by mouth 3 (three) times daily as needed for cough.  Dispense: 40 capsule; Refill: 0  Benjiman Core, PA-C  Primary Care at [facility] 01/03/2018 9:34 PM

## 2018-01-04 ENCOUNTER — Other Ambulatory Visit: Payer: Self-pay

## 2018-01-04 ENCOUNTER — Telehealth: Payer: Self-pay | Admitting: Physician Assistant

## 2018-01-04 ENCOUNTER — Encounter: Payer: Self-pay | Admitting: Family Medicine

## 2018-01-04 ENCOUNTER — Other Ambulatory Visit: Payer: Self-pay | Admitting: *Deleted

## 2018-01-04 ENCOUNTER — Ambulatory Visit (INDEPENDENT_AMBULATORY_CARE_PROVIDER_SITE_OTHER): Payer: Medicare Other | Admitting: Family Medicine

## 2018-01-04 ENCOUNTER — Ambulatory Visit: Payer: Self-pay | Admitting: *Deleted

## 2018-01-04 VITALS — BP 150/79 | HR 69 | Temp 97.6°F | Ht 59.45 in | Wt 108.0 lb

## 2018-01-04 DIAGNOSIS — R42 Dizziness and giddiness: Secondary | ICD-10-CM | POA: Diagnosis not present

## 2018-01-04 DIAGNOSIS — J209 Acute bronchitis, unspecified: Secondary | ICD-10-CM | POA: Diagnosis not present

## 2018-01-04 MED ORDER — DEXTROMETHORPHAN-GUAIFENESIN 10-100 MG/5ML PO LIQD: 5.0000 mL | ORAL | 0 refills | Status: DC | PRN

## 2018-01-04 NOTE — Telephone Encounter (Signed)
Copied from CRM [id number]. Topic: Inquiry >> Jan 04, 2018  1:26 PM Everardo PacificMoton, Bassem Bernasconi, [place] wrote: Reason for CRM: Patient grandson called for his grandmother. He stated that the medication Dextromethorphan-quaifenesin that she was given she already has and it doesn't work for her and she would like to know if she could have something different more stronger  for her coughing. If someone could give her grandson a call back about this at [phone].

## 2018-01-04 NOTE — Patient Instructions (Signed)
     IF you received an x-ray today, you will receive an invoice from [facility]. Please contact [facility] at [phone] with questions or concerns regarding your invoice.   IF you received labwork today, you will receive an invoice from [facility]. Please contact [facility] at [phone] with questions or concerns regarding your invoice.   Our billing staff will not be able to assist you with questions regarding bills from these companies.  You will be contacted with the lab results as soon as they are available. The fastest way to get your results is to activate your My Chart account. Instructions are located on the last page of this paperwork. If you have not heard from us regarding the results in 2 weeks, please contact this office.     

## 2018-01-04 NOTE — Patient Outreach (Signed)
[facility]) Care Management  01/04/2018  Katrina Stackhong T Godby 05/13/1931 [id number]  Telephone Screen  Referral Date: 12/27/17 Referral Source: MD referral (Dr. Porfirio Oarhelle Jeffery) Referral Reason: Vertigo, blood pressure check, fall risk Insurance: Medicare A & B  Outreach attempt #2 to patient. No answer. Unable to leave a voicemail.   Plan: RN CM will contact patient within one week.  Wynelle ClevelandJuanita Asriel Westrup, RN, BSN, MHA/MSL, [facility] Telephonic Care Manager Coordinator [facility] Direct Phone: [phone] Cell Phone: [phone] Toll Free: [phone] Fax: [phone]

## 2018-01-04 NOTE — Progress Notes (Signed)
1/15/201911:48 AM  Robin Andrews 04-04-1931, 84 y.o. female [id number]  Chief Complaint  Patient presents with  . Dizziness    extreme coild. went to the doctor yesterday thinking her pressure was high. she uses the wrist cuff at home. Thinks it may be giving false reading    HPI:   Patient is a 84 y.o. female who presents today for continued cough and malaise. She has been seen multiple times for this issue in the past several weeks, most recently seen yesterday. She had a normal WBC and chest xray. She just started doxycycline yesterday. Patient is here with her grandson that serves as Equities trader. She reports that her symptoms are not any worse than yesterday but she would like something for the cough as she has not really been sleeping. Tessalon pearls are not helping. She has not tried OTC cough medication. She is also worried that her sense of fatigue and malaise might be related to her blood pressure.   She denies any fever, chills. She reports she is usually cold, that is unchanged. She denies any ear pain, sore throat, SOB, chest pain, nausea, vomiting, diarrhea.   Depression screen [facility] 2/9 01/04/2018 01/03/2018 12/27/2017  Decreased Interest 0 0 0  Down, Depressed, Hopeless 0 0 0  PHQ - 2 Score 0 0 0    Allergies  Allergen Reactions  . Lisinopril     Prior to Admission medications   Medication Sig Start Date End Date Taking? Authorizing Provider  amLODipine (NORVASC) 5 MG tablet Take 1 tablet (5 mg total) by mouth daily. 07/02/17  Yes McVey, Madelaine Bhat, PA-C  benzonatate (TESSALON) 100 MG capsule Take 1-2 capsules (100-200 mg total) by mouth 3 (three) times daily as needed for cough. 01/03/18  Yes Barnett Abu, [place] D, PA-C  doxycycline (VIBRAMYCIN) 100 MG capsule Take 1 capsule (100 mg total) by mouth 2 (two) times daily. Patient not taking: Reported on 01/04/2018 01/03/18   Benjiman Core D, PA-C  glimepiride (AMARYL) 1 MG tablet Take 1 tablet (1 mg total) by mouth  daily with breakfast. Patient not taking: Reported on 01/04/2018 07/02/17   McVey, Madelaine Bhat, PA-C  hydrOXYzine (ATARAX/VISTARIL) 25 MG tablet Take 0.5-1 tablets (12.5-25 mg total) by mouth every 8 (eight) hours as needed for itching. Patient not taking: Reported on 01/03/2018 07/02/17   McVey, Madelaine Bhat, PA-C  meclizine (ANTIVERT) 12.5 MG tablet Take 1-2 tablets (12.5-25 mg total) by mouth at bedtime. Patient not taking: Reported on 01/04/2018 12/18/17   Porfirio Oar, PA-C  traMADol-acetaminophen (ULTRACET) 37.5-325 MG tablet Take 0.5-1 tablets by mouth every 8 (eight) hours as needed. Patient not taking: Reported on 01/03/2018 04/23/17   Silvestre Mesi    Past Medical History:  Diagnosis Date  . Degenerative disc disease   . Diabetes mellitus without complication (HCC)   . Hypertension   . Pruritus     Past Surgical History:  Procedure Laterality Date  . DILATION AND CURETTAGE, DIAGNOSTIC / THERAPEUTIC    . NECK SURGERY      Social History   Tobacco Use  . Smoking status: Never Smoker  . Smokeless tobacco: Never Used  Substance Use Topics  . Alcohol use: No    History reviewed. No pertinent family history.  ROS Per hpi  OBJECTIVE:  Blood pressure 128/69, pulse 74, temperature 97.6 F (36.4 C), temperature source Oral, height 4' 11.45" (1.51 m), weight 108 lb (49 kg), SpO2 96 %.  Lying down: 150/79, 69 Standing 1 min: 157/78,  72 Standing 3 mins: 153/76, 71  Physical Exam  Constitutional: She is oriented to person, place, and time and well-developed, well-nourished, and in no distress.  HENT:  Head: Normocephalic and atraumatic.  Right Ear: Hearing, tympanic membrane, external ear and ear canal normal.  Left Ear: Hearing, tympanic membrane, external ear and ear canal normal.  Mouth/Throat: Oropharynx is clear and moist.  Eyes: EOM are normal. Pupils are equal, round, and reactive to light.  Neck: Neck supple.  Cardiovascular: Normal rate,  regular rhythm and normal heart sounds. Exam reveals no gallop and no friction rub.  No murmur heard. Pulmonary/Chest: Effort normal and breath sounds normal. She has no wheezes. She has no rales.  Lymphadenopathy:    She has no cervical adenopathy.  Neurological: She is alert and oriented to person, place, and time. Gait normal.  Skin: Skin is warm and dry.    ASSESSMENT and PLAN  1. Dizziness - Orthostatic vital signs - normal  2. Acute bronchitis, unspecified organism Discussed supportive measures, continue with antibiotic, RTC precautions reviewed.  - dextromethorphan-guaiFENesin (ROBITUSSIN-DM) 10-100 MG/5ML liquid; Take 5 mLs by mouth every 4 (four) hours as needed for cough.  Return if symptoms worsen or fail to improve.    Myles LippsIrma M Santiago, MD [facility] at [facility] [address] [place], [place] [address] Ph.  [phone] Fax [phone]

## 2018-01-04 NOTE — Telephone Encounter (Signed)
Pt's grandson calling to check on status of medication change

## 2018-01-06 NOTE — Telephone Encounter (Signed)
Grandson calling back to check on status. Pts grandson stating her cough is  worse and she cant sleep.  grandson wants to know if she needs to be re seen or if something will be called in.

## 2018-01-06 NOTE — Telephone Encounter (Signed)
Advised PEC that pt needs OV per provider notes.

## 2018-01-08 ENCOUNTER — Other Ambulatory Visit: Payer: Self-pay

## 2018-01-08 ENCOUNTER — Ambulatory Visit (HOSPITAL_COMMUNITY)
Admission: EM | Admit: 2018-01-08 | Discharge: 2018-01-08 | Disposition: A | Discharge status: Home / Self Care | Payer: Medicare Other | Attending: Physician Assistant | Admitting: Physician Assistant

## 2018-01-08 ENCOUNTER — Encounter (HOSPITAL_COMMUNITY): Payer: Self-pay | Admitting: Emergency Medicine

## 2018-01-08 DIAGNOSIS — R059 Cough, unspecified: Secondary | ICD-10-CM

## 2018-01-08 DIAGNOSIS — R05 Cough: Secondary | ICD-10-CM | POA: Diagnosis not present

## 2018-01-08 MED ORDER — FAMOTIDINE 20 MG PO TABS: 20.0000 mg | ORAL_TABLET | Freq: Every evening | ORAL | 0 refills | Status: DC | PRN

## 2018-01-08 MED ORDER — LORATADINE 10 MG PO TABS: 10.0000 mg | ORAL_TABLET | Freq: Every day | ORAL | 0 refills | Status: DC

## 2018-01-08 NOTE — ED Triage Notes (Signed)
Adult grandson with patient.  Started getting sick 2 weeks ago and was seen by pcp-recommended otc meds.  Patient complains of drowsiness, fatigue, bp running high.  Complains of cough, runny nose.

## 2018-01-08 NOTE — Discharge Instructions (Signed)
Please take the doxycycline as you are prescribed.  This is a good overall antibiotic and if there is an infectious etiology to your cough this will likely remedy that.  Given that you are coughing at night I will start a medication that may help that as well.   If there is an allergic component to your cough I am also writing you for an medication for that.  #1 doxycycline as prescribed #2 famotidine #3 Claritin

## 2018-01-08 NOTE — ED Provider Notes (Signed)
01/08/2018 1:59 PM   DOB: 06/20/1931 / MRN: [id number]  SUBJECTIVE:  Robin Andrews is a 84 y.o. female presenting for fatigue, cough, rhinorrhea.  Symptoms present for 2 weeks.  She complains mostly of cough at night.  This is a dry cough.  She denies a history of asthma.  She does not take an ACE inhibitor.  She has a history of diabetes and hypertension.  She is allergic to lisinopril.   She  has a past medical history of Degenerative disc disease, Diabetes mellitus without complication (HCC), Hypertension, and Pruritus.    She  reports that  has never smoked. she has never used smokeless tobacco. She reports that she does not drink alcohol or use drugs. She  reports that she does not engage in sexual activity. The patient  has a past surgical history that includes Neck surgery and Dilation and curettage, diagnostic / therapeutic.  Her Family history is unknown by patient.  Review of Systems  Constitutional: Negative for chills, diaphoresis and fever.  Respiratory: Positive for cough. Negative for hemoptysis, sputum production, shortness of breath and wheezing.   Cardiovascular: Negative for chest pain, orthopnea and leg swelling.  Gastrointestinal: Negative for nausea.  Skin: Negative for rash.  Neurological: Negative for dizziness.     OBJECTIVE:  BP 138/72 (BP Location: Right Arm)   Pulse 75   Temp 98.1 F (36.7 C) (Oral)   Resp (!) 22   SpO2 97%   Physical Exam  Constitutional: She is active.  Non-toxic appearance.  HENT:  Right Ear: Hearing, tympanic membrane, external ear and ear canal normal.  Left Ear: Hearing, tympanic membrane, external ear and ear canal normal.  Nose: Nose normal. Right sinus exhibits no maxillary sinus tenderness and no frontal sinus tenderness. Left sinus exhibits no maxillary sinus tenderness and no frontal sinus tenderness.  Mouth/Throat: Uvula is midline, oropharynx is clear and moist and mucous membranes are normal. Mucous membranes are not dry.  No oropharyngeal exudate, posterior oropharyngeal edema or tonsillar abscesses.  Cardiovascular: Normal rate, regular rhythm, S1 normal, S2 normal, normal heart sounds and intact distal pulses. Exam reveals no gallop, no friction rub and no decreased pulses.  No murmur heard. Pulmonary/Chest: Effort normal. No stridor. No tachypnea. No respiratory distress. She has no wheezes. She has no rales.  Abdominal: She exhibits no distension.  Musculoskeletal: She exhibits no edema.  Lymphadenopathy:       Head (right side): No submandibular and no tonsillar adenopathy present.       Head (left side): No submandibular and no tonsillar adenopathy present.    She has no cervical adenopathy.  Neurological: She is alert.  Skin: Skin is warm and dry. She is not diaphoretic. No pallor.    No results found for this or any previous visit (from the past 72 hour(s)).  No results found.  ASSESSMENT AND PLAN:  No orders of the defined types were placed in this encounter.    Cough patient already prescribed doxycycline from her PCP.  Her exam is normal today.  She has not been taking this as prescribed.  Advised that she start this.  Starting Pepcid given that she is complaining of cough at night.  She already has Tessalon and advised that she continue this.  Advised that she start taking Claritin in the morning if there is an allergic component to this problem.      The patient is advised to call or return to clinic if she does not see an improvement  in symptoms, or to seek the care of the closest emergency department if she worsens with the above plan.   Deliah Boston, MHS, PA-C 01/08/2018 1:59 PM    Robin Neas, PA-C 01/08/18 [phone]

## 2018-01-10 ENCOUNTER — Other Ambulatory Visit: Payer: Self-pay | Admitting: *Deleted

## 2018-01-10 ENCOUNTER — Encounter: Payer: Self-pay | Admitting: *Deleted

## 2018-01-10 ENCOUNTER — Ambulatory Visit: Payer: Self-pay | Admitting: *Deleted

## 2018-01-10 NOTE — Patient Outreach (Signed)
[facility] Care Management  01/10/2018  Robin Andrews 08/12/1931 [id number]  Telephone Screen  Referral Date:12/27/17 Referral Source:MD referral (Dr. Porfirio Oarhelle Jeffery) Referral Reason:Vertigo, blood pressure check, fall risk Insurance:Medicare A & B  Outreach attempt #3 to patient. No answer. Unable to leave a voicemail.  Plan: RN CM will send unsuccessful outreach letter to patient and will close case if no response from patient within 10 business days.  Wynelle ClevelandJuanita Kiffany Schelling, RN, BSN, MHA/MSL, [facility] Telephonic Care Manager Coordinator [facility] Direct Phone: [phone] Cell Phone: [phone] Toll Free: [phone] Fax: [phone]

## 2018-01-19 ENCOUNTER — Ambulatory Visit: Payer: Medicare Other | Admitting: Physician Assistant

## 2018-01-20 ENCOUNTER — Encounter: Payer: Self-pay | Admitting: *Deleted

## 2018-01-20 NOTE — Telephone Encounter (Signed)
This encounter was created in error - please disregard.

## 2018-01-24 ENCOUNTER — Ambulatory Visit: Payer: Self-pay | Admitting: *Deleted

## 2018-01-24 ENCOUNTER — Encounter: Payer: Self-pay | Admitting: *Deleted

## 2018-01-24 ENCOUNTER — Other Ambulatory Visit: Payer: Self-pay | Admitting: *Deleted

## 2018-01-24 NOTE — Patient Outreach (Signed)
[facility] Care Management  01/24/2018  Robin Andrews 04/07/1931 [id number]   Case Closure:  RN CM sent unsuccessful outreach letter to patient on 01/24/18. RN CM will close case due to no response from patient within 10 business days.  Plan:  RN CM will notify [facility] administrative assistant regarding case closure.  RN CM will send MD case closure letter. RN CM will send patient case closure letter.   Wynelle ClevelandJuanita Haillie Radu, RN, BSN, MHA/MSL, [facility] Telephonic Care Manager Coordinator [facility] Direct Phone: [phone] Cell Phone: [phone] Toll Free: [phone] Fax: [phone]

## 2018-02-05 ENCOUNTER — Ambulatory Visit (INDEPENDENT_AMBULATORY_CARE_PROVIDER_SITE_OTHER): Payer: Medicare Other

## 2018-02-05 ENCOUNTER — Encounter: Payer: Self-pay | Admitting: Physician Assistant

## 2018-02-05 ENCOUNTER — Ambulatory Visit (INDEPENDENT_AMBULATORY_CARE_PROVIDER_SITE_OTHER): Payer: Medicare Other | Admitting: Physician Assistant

## 2018-02-05 VITALS — BP 149/74 | HR 73 | Temp 98.7°F | Resp 16 | Ht 59.3 in

## 2018-02-05 DIAGNOSIS — R6889 Other general symptoms and signs: Secondary | ICD-10-CM

## 2018-02-05 DIAGNOSIS — R05 Cough: Secondary | ICD-10-CM

## 2018-02-05 DIAGNOSIS — R059 Cough, unspecified: Secondary | ICD-10-CM

## 2018-02-05 DIAGNOSIS — R52 Pain, unspecified: Secondary | ICD-10-CM

## 2018-02-05 LAB — POCT CBC
GRANULOCYTE PERCENT: 69.6 % (ref 37–80)
HCT, POC: 37.7 % (ref 37.7–47.9)
Hemoglobin: 12.6 g/dL (ref 12.2–16.2)
Lymph, poc: 2.2 (ref 0.6–3.4)
MCH: 28.7 pg (ref 27–31.2)
MCHC: 33.6 g/dL (ref 31.8–35.4)
MCV: 85.6 fL (ref 80–97)
MID (CBC): 0.6 (ref 0–0.9)
MPV: 6.7 fL (ref 0–99.8)
POC GRANULOCYTE: 6.3 (ref 2–6.9)
POC LYMPH %: 24.3 % (ref 10–50)
POC MID %: 6.1 %M (ref 0–12)
Platelet Count, POC: 313 10*3/uL (ref 142–424)
RBC: 4.4 M/uL (ref 4.04–5.48)
RDW, POC: 12.5 %
WBC: 9.1 10*3/uL (ref 4.6–10.2)

## 2018-02-05 LAB — POCT URINALYSIS DIP (MANUAL ENTRY)
BILIRUBIN UA: NEGATIVE
BILIRUBIN UA: NEGATIVE mg/dL
GLUCOSE UA: NEGATIVE mg/dL
Leukocytes, UA: NEGATIVE
Nitrite, UA: NEGATIVE
Protein Ur, POC: 100 mg/dL — AB
SPEC GRAV UA: 1.01 (ref 1.010–1.025)
UROBILINOGEN UA: 0.2 U/dL
pH, UA: 6 (ref 5.0–8.0)

## 2018-02-05 LAB — POC INFLUENZA A&B (BINAX/QUICKVUE)
INFLUENZA A, POC: NEGATIVE
Influenza B, POC: NEGATIVE

## 2018-02-05 MED ORDER — BENZONATATE 100 MG PO CAPS: 100.0000 mg | ORAL_CAPSULE | Freq: Three times a day (TID) | ORAL | 0 refills | Status: DC | PRN

## 2018-02-05 MED ORDER — GUAIFENESIN ER 1200 MG PO TB12: 1.0000 | ORAL_TABLET | Freq: Two times a day (BID) | ORAL | 1 refills | Status: DC | PRN

## 2018-02-05 NOTE — Progress Notes (Signed)
PRIMARY CARE AT [facility] [address] [place] [address] [phone]  Date:  02/05/2018   Name:  Robin Andrews   DOB:  29-Oct-1931   MRN:  [id number]  PCP:  Porfirio Oar, PA-C    History of Present Illness:  Robin Andrews is a 84 y.o. female patient who presents to PCP with  Chief Complaint  Patient presents with  . Generalized Body Aches  . Headache     Patient reports headache, generalized body aches for the last 2 days. He has some congestion.  Sore throat minimal.  No coughing.  No sob or dypsnea.   She has no urinary frequency, hematuria, or dysuria She states that she has some lightheadedness.  She is coughing.  Non-productive.  No sob or dyspnea.   Patient Active Problem List   Diagnosis Date Noted  . Diabetes mellitus type 2, controlled, without complications (HCC) 12/18/2017  . Cardiac murmur 12/18/2017  . Left carotid bruit 12/18/2017  . Hyponatremia 06/01/2013  . Weakness 06/01/2013  . Cervical disc disease 05/25/2012  . Chronic pain syndrome 05/25/2012  . Osteoarthritis 05/25/2012  . Hyperlipidemia 05/30/2007  . HYPERTENSION, BENIGN ESSENTIAL 05/30/2007  . ALLERGIC RHINITIS 05/30/2007  . ASTEATOTIC ECZEMA 05/30/2007    Past Medical History:  Diagnosis Date  . Degenerative disc disease   . Diabetes mellitus without complication (HCC)   . Hypertension   . Pruritus     Past Surgical History:  Procedure Laterality Date  . DILATION AND CURETTAGE, DIAGNOSTIC / THERAPEUTIC    . NECK SURGERY      Social History   Tobacco Use  . Smoking status: Never Smoker  . Smokeless tobacco: Never Used  Substance Use Topics  . Alcohol use: No  . Drug use: No    Family History  Family history unknown: Yes    Allergies  Allergen Reactions  . Lisinopril     Medication list has been reviewed and updated.  Current Outpatient Medications on File Prior to Visit  Medication Sig Dispense Refill  . amLODipine (NORVASC) 5 MG tablet Take 1 tablet (5 mg total)  by mouth daily. 60 tablet 6  . dextromethorphan-guaiFENesin (ROBITUSSIN-DM) 10-100 MG/5ML liquid Take 5 mLs by mouth every 4 (four) hours as needed for cough. 180 mL 0  . doxycycline (VIBRAMYCIN) 100 MG capsule Take 1 capsule (100 mg total) by mouth 2 (two) times daily. (Patient not taking: Reported on 01/04/2018) 20 capsule 0  . famotidine (PEPCID) 20 MG tablet Take 1 tablet (20 mg total) by mouth at bedtime as needed for heartburn or indigestion. 30 tablet 0  . glimepiride (AMARYL) 1 MG tablet Take 1 tablet (1 mg total) by mouth daily with breakfast. (Patient not taking: Reported on 01/04/2018) 60 tablet 6  . hydrOXYzine (ATARAX/VISTARIL) 25 MG tablet Take 0.5-1 tablets (12.5-25 mg total) by mouth every 8 (eight) hours as needed for itching. (Patient not taking: Reported on 01/03/2018) 60 tablet 3  . loratadine (CLARITIN) 10 MG tablet Take 1 tablet (10 mg total) by mouth daily. 30 tablet 0  . meclizine (ANTIVERT) 12.5 MG tablet Take 1-2 tablets (12.5-25 mg total) by mouth at bedtime. (Patient not taking: Reported on 01/04/2018) 30 tablet 0  . traMADol-acetaminophen (ULTRACET) 37.5-325 MG tablet Take 0.5-1 tablets by mouth every 8 (eight) hours as needed. (Patient not taking: Reported on 01/03/2018) 90 tablet 1   Current Facility-Administered Medications on File Prior to Visit  Medication Dose Route Frequency Provider Last Rate Last Dose  . ondansetron (  ZOFRAN-ODT) disintegrating tablet 4 mg  4 mg Oral Once Stallings, Zoe A, MD        ROS ROS otherwise unremarkable unless listed above.  Physical Examination: BP (!) 149/74   Pulse 73   Temp 98.7 F (37.1 C) (Oral)   Resp 16   Ht 4' 11.3" (1.506 m)   SpO2 98%   BMI 21.59 kg/m  Ideal Body Weight: Weight in (lb) to have BMI = 25: 124.8  Physical Exam  Constitutional: She is oriented to person, place, and time. She appears well-developed and well-nourished. No distress.  HENT:  Head: Normocephalic and atraumatic.  Right Ear: Tympanic  membrane, external ear and ear canal normal.  Left Ear: Tympanic membrane, external ear and ear canal normal.  Nose: Mucosal edema and rhinorrhea present. Right sinus exhibits no maxillary sinus tenderness and no frontal sinus tenderness. Left sinus exhibits no maxillary sinus tenderness and no frontal sinus tenderness.  Mouth/Throat: No uvula swelling. No oropharyngeal exudate, posterior oropharyngeal edema or posterior oropharyngeal erythema.  Eyes: Conjunctivae and EOM are normal. Pupils are equal, round, and reactive to light.  Cardiovascular: Normal rate and regular rhythm. Exam reveals no gallop, no distant heart sounds and no friction rub.  No murmur heard. Pulmonary/Chest: Effort normal. No respiratory distress. She has no decreased breath sounds. She has no wheezes. She has no rhonchi.  Lymphadenopathy:       Head (right side): No submandibular, no tonsillar, no preauricular and no posterior auricular adenopathy present.       Head (left side): No submandibular, no tonsillar, no preauricular and no posterior auricular adenopathy present.  Neurological: She is alert and oriented to person, place, and time. No cranial nerve deficit or sensory deficit. Coordination normal.  Skin: She is not diaphoretic.  Psychiatric: She has a normal mood and affect. Her behavior is normal.    Dg Chest 2 View  Result Date: 02/05/2018 CLINICAL DATA:  Flu-like symptoms.  Productive cough and fatigue. EXAM: CHEST  2 VIEW COMPARISON:  01/03/2018 FINDINGS: Both lungs are clear. Heart and mediastinum are within normal limits. Surgical plate in the lower cervical spine. The trachea is midline. Kyphosis near the thoracolumbar junction. Atherosclerotic calcifications at the aortic arch. IMPRESSION: No active cardiopulmonary disease. Electronically Signed   By: Richarda OverlieAdam  Henn M.D.   On: 02/05/2018 10:08   Results for orders placed or performed in visit on 02/05/18  Vitamin B12  Result Value Ref Range   Vitamin B-12  1,189 232 - 1,245 pg/mL  Basic metabolic panel  Result Value Ref Range   Glucose 108 (H) 65 - 99 mg/dL   BUN 17 8 - 27 mg/dL   Creatinine, Ser 4.090.77 0.57 - 1.00 mg/dL   GFR calc non Af Amer 71 >59 mL/min/1.73   GFR calc Af Amer 81 >59 mL/min/1.73   BUN/Creatinine Ratio 22 12 - 28   Sodium 137 134 - 144 mmol/L   Potassium 4.1 3.5 - 5.2 mmol/L   Chloride 99 96 - 106 mmol/L   CO2 22 20 - 29 mmol/L   Calcium 9.6 8.7 - 10.3 mg/dL  POC Influenza A&B(BINAX/QUICKVUE)  Result Value Ref Range   Influenza A, POC Negative Negative   Influenza B, POC Negative Negative  POCT urinalysis dipstick  Result Value Ref Range   Color, UA yellow yellow   Clarity, UA clear clear   Glucose, UA negative negative mg/dL   Bilirubin, UA negative negative   Ketones, POC UA negative negative mg/dL   Spec Grav,  UA 1.010 1.010 - 1.025   Blood, UA trace-lysed (A) negative   pH, UA 6.0 5.0 - 8.0   Protein Ur, POC =100 (A) negative mg/dL   Urobilinogen, UA 0.2 0.2 or 1.0 E.U./dL   Nitrite, UA Negative Negative   Leukocytes, UA Negative Negative  POCT CBC  Result Value Ref Range   WBC 9.1 4.6 - 10.2 K/uL   Lymph, poc 2.2 0.6 - 3.4   POC LYMPH PERCENT 24.3 10 - 50 %L   MID (cbc) 0.6 0 - 0.9   POC MID % 6.1 0 - 12 %M   POC Granulocyte 6.3 2 - 6.9   Granulocyte percent 69.6 37 - 80 %G   RBC 4.40 4.04 - 5.48 M/uL   Hemoglobin 12.6 12.2 - 16.2 g/dL   HCT, POC 95.2 84.1 - 47.9 %   MCV 85.6 80 - 97 fL   MCH, POC 28.7 27 - 31.2 pg   MCHC 33.6 31.8 - 35.4 g/dL   RDW, POC 32.4 %   Platelet Count, POC 313 142 - 424 K/uL   MPV 6.7 0 - 99.8 fL   No data found.   Assessment and Plan: Robin Andrews is a 84 y.o. female who is here today for cc of  Chief Complaint  Patient presents with  . Generalized Body Aches  . Headache   Flu-like symptoms - Plan: POC Influenza A&B(BINAX/QUICKVUE), POCT urinalysis dipstick, DG Chest 2 View, POCT CBC, Vitamin B12, Guaifenesin (MUCINEX MAXIMUM STRENGTH) 1200 MG TB12,  benzonatate (TESSALON) 100 MG capsule, Basic metabolic panel  Cough - Plan: POCT urinalysis dipstick, DG Chest 2 View, POCT CBC, Vitamin B12, Guaifenesin (MUCINEX MAXIMUM STRENGTH) 1200 MG TB12, benzonatate (TESSALON) 100 MG capsule  Body aches - Plan: POCT urinalysis dipstick, DG Chest 2 View, POCT CBC, Vitamin B12, Guaifenesin (MUCINEX MAXIMUM STRENGTH) 1200 MG TB12, benzonatate (TESSALON) 100 MG capsule  Trena Platt, PA-C [facility] 2/26/20191:29 PM

## 2018-02-05 NOTE — Patient Instructions (Signed)
     IF you received an x-ray today, you will receive an invoice from [facility]. Please contact [facility] at [phone] with questions or concerns regarding your invoice.   IF you received labwork today, you will receive an invoice from [facility]. Please contact [facility] at [phone] with questions or concerns regarding your invoice.   Our billing staff will not be able to assist you with questions regarding bills from these companies.  You will be contacted with the lab results as soon as they are available. The fastest way to get your results is to activate your My Chart account. Instructions are located on the last page of this paperwork. If you have not heard from us regarding the results in 2 weeks, please contact this office.     

## 2018-02-06 LAB — BASIC METABOLIC PANEL
BUN / CREAT RATIO: 22 (ref 12–28)
BUN: 17 mg/dL (ref 8–27)
CALCIUM: 9.6 mg/dL (ref 8.7–10.3)
CHLORIDE: 99 mmol/L (ref 96–106)
CO2: 22 mmol/L (ref 20–29)
CREATININE: 0.77 mg/dL (ref 0.57–1.00)
GFR, EST AFRICAN AMERICAN: 81 mL/min/{1.73_m2} (ref >59)
GFR, EST NON AFRICAN AMERICAN: 71 mL/min/{1.73_m2} (ref >59)
Glucose: 108 mg/dL — ABNORMAL HIGH (ref 65–99)
Potassium: 4.1 mmol/L (ref 3.5–5.2)
Sodium: 137 mmol/L (ref 134–144)

## 2018-02-06 LAB — VITAMIN B12: Vitamin B-12: 1189 pg/mL (ref 232–1245)

## 2018-02-15 ENCOUNTER — Encounter: Payer: Self-pay | Admitting: Physician Assistant

## 2018-03-23 ENCOUNTER — Encounter: Payer: Self-pay | Admitting: Physician Assistant

## 2018-03-28 ENCOUNTER — Encounter: Payer: Self-pay | Admitting: Physician Assistant

## 2018-04-18 ENCOUNTER — Ambulatory Visit (INDEPENDENT_AMBULATORY_CARE_PROVIDER_SITE_OTHER): Payer: Medicare Other | Admitting: Physician Assistant

## 2018-04-18 ENCOUNTER — Other Ambulatory Visit: Payer: Self-pay

## 2018-04-18 ENCOUNTER — Encounter: Payer: Self-pay | Admitting: Physician Assistant

## 2018-04-18 ENCOUNTER — Ambulatory Visit (INDEPENDENT_AMBULATORY_CARE_PROVIDER_SITE_OTHER): Payer: Medicare Other

## 2018-04-18 VITALS — BP 134/71 | HR 76 | Temp 98.5°F | Resp 16 | Ht 59.57 in | Wt 107.6 lb

## 2018-04-18 DIAGNOSIS — J309 Allergic rhinitis, unspecified: Secondary | ICD-10-CM

## 2018-04-18 DIAGNOSIS — R05 Cough: Secondary | ICD-10-CM

## 2018-04-18 DIAGNOSIS — R059 Cough, unspecified: Secondary | ICD-10-CM

## 2018-04-18 DIAGNOSIS — J209 Acute bronchitis, unspecified: Secondary | ICD-10-CM

## 2018-04-18 LAB — POCT CBC
Granulocyte percent: 74.2 %G (ref 37–80)
HCT, POC: 36.5 % — AB (ref 37.7–47.9)
HEMOGLOBIN: 11.8 g/dL — AB (ref 12.2–16.2)
LYMPH, POC: 2.3 (ref 0.6–3.4)
MCH, POC: 28.3 pg (ref 27–31.2)
MCHC: 32.2 g/dL (ref 31.8–35.4)
MCV: 87.9 fL (ref 80–97)
MID (cbc): 9.6 — AB (ref 0–0.9)
MPV: 6.9 fL (ref 0–99.8)
PLATELET COUNT, POC: 338 10*3/uL (ref 142–424)
POC Granulocyte: 8.3 — AB (ref 2–6.9)
POC LYMPH PERCENT: 20.4 %L (ref 10–50)
POC MID %: 5.4 % (ref 0–12)
RBC: 4.16 M/uL (ref 4.04–5.48)
RDW, POC: 12.6 %
WBC: 11.2 10*3/uL — AB (ref 4.6–10.2)

## 2018-04-18 MED ORDER — DEXTROMETHORPHAN-GUAIFENESIN 10-100 MG/5ML PO LIQD: 5.0000 mL | ORAL | 0 refills | Status: DC | PRN

## 2018-04-18 MED ORDER — DOXYCYCLINE HYCLATE 100 MG PO CAPS: 100.0000 mg | ORAL_CAPSULE | Freq: Two times a day (BID) | ORAL | 0 refills | Status: DC

## 2018-04-18 MED ORDER — BENZONATATE 100 MG PO CAPS: 100.0000 mg | ORAL_CAPSULE | Freq: Three times a day (TID) | ORAL | 0 refills | Status: DC | PRN

## 2018-04-18 MED ORDER — AZELASTINE HCL 0.1 % NA SOLN: 2.0000 | Freq: Two times a day (BID) | NASAL | 0 refills | Status: DC

## 2018-04-18 MED ORDER — LORATADINE 10 MG PO TABS: 10.0000 mg | ORAL_TABLET | Freq: Every day | ORAL | 0 refills | Status: DC

## 2018-04-18 MED ORDER — ALBUTEROL SULFATE (2.5 MG/3ML) 0.083% IN NEBU
2.5000 mg | INHALATION_SOLUTION | Freq: Once | RESPIRATORY_TRACT | Status: AC
  Administered 2018-04-18: 2.5 mg via RESPIRATORY_TRACT

## 2018-04-18 NOTE — Progress Notes (Signed)
MRN: [id number] DOB: 1931/04/10  Subjective:   Robin Andrews is a 84 y.o. female presenting for chief complaint of Cough (X 1 week with - runny nose) and Sinus Problem .  Reports 1 week history of sinus congestion, rhinorrhea, sore throat and dry cough (no hemoptysis), sometimes productive. Cough is worsening. Has not tried anything for relief. Denies fever, ear pain, wheezing, shortness of breath, chest pain and myalgia, night sweats, chills, fatigue, nausea, vomiting, abdominal pain and diarrhea.  Has had sick contact with family members. Has history of seasonal allergies, not taking any medication. No history of asthma. Patient has had flu shot this season. Denies smoking. Denies recent travel or hospitalizations. Of note, friend is present to translate.  Denies any other aggravating or relieving factors, no other questions or concerns.  Review of Systems  HENT: Negative for ear discharge.        Positive for sneezing  Eyes: Negative for discharge and redness.  Neurological: Negative for headaches.     [place] has a current medication list which includes the following prescription(s): amlodipine, glimepiride, benzonatate, dextromethorphan-guaifenesin, doxycycline, famotidine, guaifenesin, hydroxyzine, loratadine, meclizine, and tramadol-acetaminophen, and the following Facility-Administered Medications: albuterol and ondansetron. Also is allergic to lisinopril.  Wen  has a past medical history of Degenerative disc disease, Diabetes mellitus without complication (HCC), Hypertension, and Pruritus. Also  has a past surgical history that includes Neck surgery and Dilation and curettage, diagnostic / therapeutic.   Objective:   Vitals: BP 134/71 (BP Location: Left Arm, Patient Position: Sitting, Cuff Size: Normal)   Pulse 76   Temp 98.5 F (36.9 C) (Oral)   Resp 16   Ht 4' 11.57" (1.513 m)   Wt 107 lb 9.6 oz (48.8 kg)   SpO2 95%   BMI 21.32 kg/m   Physical Exam  Constitutional:  She is oriented to person, place, and time. She appears well-developed and well-nourished. No distress.  HENT:  Head: Normocephalic and atraumatic.  Right Ear: Tympanic membrane, external ear and ear canal normal.  Left Ear: Tympanic membrane, external ear and ear canal normal.  Nose: Mucosal edema present. Right sinus exhibits no maxillary sinus tenderness and no frontal sinus tenderness. Left sinus exhibits no maxillary sinus tenderness and no frontal sinus tenderness.  Mouth/Throat: Uvula is midline and mucous membranes are normal. No posterior oropharyngeal edema, posterior oropharyngeal erythema or tonsillar abscesses. Tonsils are 1+ on the right. Tonsils are 1+ on the left. No tonsillar exudate.  Eyes: Conjunctivae are normal.  Neck: Normal range of motion.  Cardiovascular: Normal rate, regular rhythm and intact distal pulses.  Murmur heard.  Systolic murmur is present. Pulmonary/Chest: Effort normal. No accessory muscle usage. No respiratory distress. She has no decreased breath sounds. She has wheezes (few wheezes noted throughout posterior lung fields bilaterally). She has rhonchi (few rhonchi noted in right posterior lung field). She has no rales.  Lymphadenopathy:       Head (right side): No submental, no submandibular, no tonsillar, no preauricular, no posterior auricular and no occipital adenopathy present.       Head (left side): No submental, no submandibular, no tonsillar, no preauricular, no posterior auricular and no occipital adenopathy present.    She has no cervical adenopathy.       Right: No supraclavicular adenopathy present.       Left: No supraclavicular adenopathy present.  Neurological: She is alert and oriented to person, place, and time.  Skin: Skin is warm and dry.  Psychiatric: She has  a normal mood and affect.  Vitals reviewed.   Results for orders placed or performed in visit on 04/18/18 (from the past 24 hour(s))  POCT CBC     Status: Abnormal    Collection Time: 04/18/18 10:05 AM  Result Value Ref Range   WBC 11.2 (A) 4.6 - 10.2 K/uL   Lymph, poc 2.3 0.6 - 3.4   POC LYMPH PERCENT 20.4 10 - 50 %L   MID (cbc) 9.6 (A) 0 - 0.9   POC MID % 5.4 0 - 12 %M   POC Granulocyte 8.3 (A) 2 - 6.9   Granulocyte percent 74.2 37 - 80 %G   RBC 4.16 4.04 - 5.48 M/uL   Hemoglobin 11.8 (A) 12.2 - 16.2 g/dL   HCT, POC 78.2 (A) 95.6 - 47.9 %   MCV 87.9 80 - 97 fL   MCH, POC 28.3 27 - 31.2 pg   MCHC 32.2 31.8 - 35.4 g/dL   RDW, POC 21.3 %   Platelet Count, POC 338 142 - 424 K/uL   MPV 6.9 0 - 99.8 fL   Dg Chest 2 View  Result Date: 04/18/2018 CLINICAL DATA:  Cough for 1 week. Posterior right sided rhonchi. Left-sided wheezing. EXAM: CHEST - 2 VIEW COMPARISON:  Radiographs 02/05/2018 and 01/03/2018. FINDINGS: The heart size and mediastinal contours are stable. There is aortic atherosclerosis. Mild central airway thickening is present without focal airspace disease, hyperinflation or pleural effusion. There are mild degenerative changes throughout the spine status post lower cervical fusion. IMPRESSION: Mild central airway thickening suggesting bronchitis. No evidence of pneumonia. Aortic atherosclerosis. Electronically Signed   By: Carey Bullocks M.D.   On: 04/18/2018 10:23    SpO2 increased to 99% after breathing tx of albuterol. Wheezing and rhonchi completely resolved. Lungs CTAB.   Assessment and Plan :  1. Acute bronchitis with bronchospasm Bronchospasm resolved with breathing tx. No hx of asthma or COPD. Plain films consistent with bronchitis. WBC mildly elevated with left shift. Due to hx and worsening cough with elevated WBC, will cover for underlying bacterial etiology at this time and also provide symptomatic relief. Advised to return to clinic if symptoms worsen, do not improve, or as needed.  - albuterol (PROVENTIL) (2.5 MG/3ML) 0.083% nebulizer solution 2.5 mg - benzonatate (TESSALON) 100 MG capsule; Take 1-2 capsules (100-200 mg total) by  mouth 3 (three) times daily as needed for cough.  Dispense: 40 capsule; Refill: 0 - dextromethorphan-guaiFENesin (ROBITUSSIN-DM) 10-100 MG/5ML liquid; Take 5 mLs by mouth every 4 (four) hours as needed for cough.  Dispense: 180 mL; Refill: 0 - doxycycline (VIBRAMYCIN) 100 MG capsule; Take 1 capsule (100 mg total) by mouth 2 (two) times daily.  Dispense: 20 capsule; Refill: 0  2. Cough - DG Chest 2 View; Future - POCT CBC  3. Allergic rhinitis, unspecified seasonality, unspecified trigger Pt also has untreated allergies. Refills for allergy medicine provided.  - loratadine (CLARITIN) 10 MG tablet; Take 1 tablet (10 mg total) by mouth daily.  Dispense: 30 tablet; Refill: 0 - azelastine (ASTELIN) 0.1 % nasal spray; Place 2 sprays into both nostrils 2 (two) times daily. Use in each nostril as directed  Dispense: 30 mL; Refill: 0  Benjiman Core, PA-C  [facility] 04/18/2018 10:28 AM

## 2018-04-18 NOTE — Patient Instructions (Addendum)
Please pick up antibiotics and take as prescribed. You may use tessalon perles to help stop the cough. Use claritin and nasal spray for allergies. You can use cough syrup at night time. If any of your symptoms worsen or you develop any new concerning symptoms, please return to office. If no improvement in 7-10 days, please return to office.    While taking Doxycycline:  -Do not drink milk or take iron supplements, multivitamins, calcium supplements, antacids, laxatives within 2 hours before or after taking doxycycline. -Avoid direct exposure to sunlight or tanning beds. Doxycycline can make you sunburn more easily. Wear protective clothing and use sunscreen (SPF 30 or higher) when you are outdoors. -Antibiotic medicines can cause diarrhea, which may be a sign of a new infection. If you have diarrhea that is watery or bloody, stop taking this medicine and seek medical care.     Acute Bronchitis, Adult Acute bronchitis is when air tubes (bronchi) in the lungs suddenly get swollen. The condition can make it hard to breathe. It can also cause these symptoms:  A cough.  Coughing up clear, yellow, or green mucus.  Wheezing.  Chest congestion.  Shortness of breath.  A fever.  Body aches.  Chills.  A sore throat.  Follow these instructions at home: Medicines  Take over-the-counter and prescription medicines only as told by your doctor.  If you were prescribed an antibiotic medicine, take it as told by your doctor. Do not stop taking the antibiotic even if you start to feel better. General instructions  Rest.  Drink enough fluids to keep your pee (urine) clear or pale yellow.  Avoid smoking and secondhand smoke. If you smoke and you need help quitting, ask your doctor. Quitting will help your lungs heal faster.  Use an inhaler, cool mist vaporizer, or humidifier as told by your doctor.  Keep all follow-up visits as told by your doctor. This is important. How is this  prevented? To lower your risk of getting this condition again:  Wash your hands often with soap and water. If you cannot use soap and water, use hand sanitizer.  Avoid contact with people who have cold symptoms.  Try not to touch your hands to your mouth, nose, or eyes.  Make sure to get the flu shot every year.  Contact a doctor if:  Your symptoms do not get better in 2 weeks. Get help right away if:  You cough up blood.  You have chest pain.  You have very bad shortness of breath.  You become dehydrated.  You faint (pass out) or keep feeling like you are going to pass out.  You keep throwing up (vomiting).  You have a very bad headache.  Your fever or chills gets worse. This information is not intended to replace advice given to you by your health care provider. Make sure you discuss any questions you have with your health care provider. Document Released: 05/25/2008 Document Revised: 07/15/2016 Document Reviewed: 05/27/2016 Elsevier Interactive Patient Education  2018 [employer].  IF you received an x-ray today, you will receive an invoice from [facility]: [facility]. Please contact [facility] at [phone] with questions or concerns regarding your invoice.   IF you received labwork today, you will receive an invoice from [facility]. Please contact [facility] at [phone] with questions or concerns regarding your invoice.   Our billing staff will not be able to assist you with questions regarding bills from these companies.  You will be contacted with the lab results as  soon as they are available. The fastest way to get your results is to activate your My Chart account. Instructions are located on the last page of this paperwork. If you have not heard from [place] regarding the results in 2 weeks, please contact this office.

## 2018-04-19 ENCOUNTER — Encounter: Payer: Self-pay | Admitting: Physician Assistant

## 2018-04-20 ENCOUNTER — Telehealth: Payer: Self-pay | Admitting: Physician Assistant

## 2018-04-20 NOTE — Telephone Encounter (Signed)
Copied from CRM [id number]. Topic: Quick Communication - See Telephone Encounter >> Apr 20, 2018  9:51 AM Diana Eves B wrote: CRM for notification. See Telephone encounter for: 04/20/18.  Pt was in office on 04/18/18 states the medication is not helping her cough at all and is needing something different called in. Please send to [id number] - [place], [place] - [address] AT [facility] OF [address]

## 2018-04-21 ENCOUNTER — Encounter: Payer: Self-pay | Admitting: Physician Assistant

## 2018-04-21 ENCOUNTER — Ambulatory Visit (INDEPENDENT_AMBULATORY_CARE_PROVIDER_SITE_OTHER): Payer: Medicare Other | Admitting: Physician Assistant

## 2018-04-21 ENCOUNTER — Other Ambulatory Visit: Payer: Self-pay

## 2018-04-21 ENCOUNTER — Telehealth: Payer: Self-pay

## 2018-04-21 ENCOUNTER — Ambulatory Visit (INDEPENDENT_AMBULATORY_CARE_PROVIDER_SITE_OTHER): Payer: Medicare Other

## 2018-04-21 VITALS — BP 142/56 | HR 76 | Temp 99.0°F | Resp 16 | Ht 60.0 in | Wt 106.4 lb

## 2018-04-21 DIAGNOSIS — R1032 Left lower quadrant pain: Secondary | ICD-10-CM

## 2018-04-21 LAB — POCT CBC
Granulocyte percent: 72.2 %G (ref 37–80)
HEMATOCRIT: 36.9 % — AB (ref 37.7–47.9)
Hemoglobin: 12.2 g/dL (ref 12.2–16.2)
LYMPH, POC: 2.2 (ref 0.6–3.4)
MCH, POC: 27.9 pg (ref 27–31.2)
MCHC: 33.1 g/dL (ref 31.8–35.4)
MCV: 84.1 fL (ref 80–97)
MID (cbc): 0.8 (ref 0–0.9)
MPV: 6.9 fL (ref 0–99.8)
POC GRANULOCYTE: 7.7 — AB (ref 2–6.9)
POC LYMPH %: 20.7 % (ref 10–50)
POC MID %: 7.1 %M (ref 0–12)
Platelet Count, POC: 400 10*3/uL (ref 142–424)
RBC: 4.39 M/uL (ref 4.04–5.48)
RDW, POC: 12.2 %
WBC: 10.7 10*3/uL — AB (ref 4.6–10.2)

## 2018-04-21 MED ORDER — DOCUSATE SODIUM 100 MG PO CAPS: 100.0000 mg | ORAL_CAPSULE | Freq: Three times a day (TID) | ORAL | 0 refills | Status: DC

## 2018-04-21 MED ORDER — DICYCLOMINE HCL 10 MG PO CAPS: 10.0000 mg | ORAL_CAPSULE | Freq: Three times a day (TID) | ORAL | 0 refills | Status: DC

## 2018-04-21 NOTE — Telephone Encounter (Signed)
Copied from CRM [id number]. Topic: Quick Communication - See Telephone Encounter >> Apr 20, 2018  9:51 AM Diana Eves B wrote: CRM for notification. See Telephone encounter for: 04/20/18.  Pt was in office on 04/18/18 states the medication is not helping her cough at all and is needing something different called in. Please send to [facility] [id number] [place], [place] - [address] AT [facility] OF [facility] [address] >> Apr 20, 2018  3:50 PM Jolayne Haines L wrote: Patient is calling to follow up on this

## 2018-04-21 NOTE — Progress Notes (Signed)
Robin Andrews  MRN: [id number] DOB: September 25, 1931  PCP: Porfirio Oar, PA-C  Chief Complaint  Patient presents with  . Abdominal Pain    x 3 days when cough feels bump and hurts very bad    Subjective:  Pt presents to clinic for left lower quadrant pain that started approximately 3 days ago.  3 days ago patient was seen for allergies and at that time had a mildly elevated white count with a chest x-ray that showed possible bronchitis and she was started on doxycycline.  Once starting this medication she noticed that she started having this sharp consistent pain on her left lower quadrant.  She has noted no change in stool.  She has no nausea.  The pain does get worse when she coughs but other than that she has not found anything that makes the pain worse.  She has tried warm compresses and that does not improve the pain.  She does feel like the pain has gotten worse over the last 3 days.  History is obtained by patient through interpretor provided by [facility].  Review of Systems  Constitutional: Negative for chills and fever.  Gastrointestinal: Positive for abdominal pain (LLQ). Negative for constipation, diarrhea, nausea and vomiting.  Genitourinary: Negative.  Negative for difficulty urinating, frequency, menstrual problem and urgency.    Patient Active Problem List   Diagnosis Date Noted  . Diabetes mellitus type 2, controlled, without complications (HCC) 12/18/2017  . Cardiac murmur 12/18/2017  . Left carotid bruit 12/18/2017  . Hyponatremia 06/01/2013  . Weakness 06/01/2013  . Cervical disc disease 05/25/2012  . Chronic pain syndrome 05/25/2012  . Osteoarthritis 05/25/2012  . Hyperlipidemia 05/30/2007  . HYPERTENSION, BENIGN ESSENTIAL 05/30/2007  . ALLERGIC RHINITIS 05/30/2007  . ASTEATOTIC ECZEMA 05/30/2007    Current Outpatient Medications on File Prior to Visit  Medication Sig Dispense Refill  . amLODipine (NORVASC) 5 MG tablet Take 1 tablet (5 mg total) by mouth daily. 60  tablet 6  . azelastine (ASTELIN) 0.1 % nasal spray Place 2 sprays into both nostrils 2 (two) times daily. Use in each nostril as directed 30 mL 0  . benzonatate (TESSALON) 100 MG capsule Take 1-2 capsules (100-200 mg total) by mouth 3 (three) times daily as needed for cough. 40 capsule 0  . doxycycline (VIBRAMYCIN) 100 MG capsule Take 1 capsule (100 mg total) by mouth 2 (two) times daily. 20 capsule 0  . glimepiride (AMARYL) 1 MG tablet Take 1 tablet (1 mg total) by mouth daily with breakfast. 60 tablet 6  . loratadine (CLARITIN) 10 MG tablet Take 1 tablet (10 mg total) by mouth daily. 30 tablet 0  . dextromethorphan-guaiFENesin (ROBITUSSIN-DM) 10-100 MG/5ML liquid Take 5 mLs by mouth every 4 (four) hours as needed for cough. (Patient not taking: Reported on 04/21/2018) 180 mL 0  . famotidine (PEPCID) 20 MG tablet Take 1 tablet (20 mg total) by mouth at bedtime as needed for heartburn or indigestion. (Patient not taking: Reported on 04/18/2018) 30 tablet 0  . Guaifenesin (MUCINEX MAXIMUM STRENGTH) 1200 MG TB12 Take 1 tablet (1,200 mg total) by mouth every 12 (twelve) hours as needed. (Patient not taking: Reported on 04/18/2018) 14 tablet 1  . hydrOXYzine (ATARAX/VISTARIL) 25 MG tablet Take 0.5-1 tablets (12.5-25 mg total) by mouth every 8 (eight) hours as needed for itching. (Patient not taking: Reported on 01/03/2018) 60 tablet 3  . meclizine (ANTIVERT) 12.5 MG tablet Take 1-2 tablets (12.5-25 mg total) by mouth at bedtime. (Patient not taking: Reported  on 01/04/2018) 30 tablet 0  . traMADol-acetaminophen (ULTRACET) 37.5-325 MG tablet Take 0.5-1 tablets by mouth every 8 (eight) hours as needed. (Patient not taking: Reported on 01/03/2018) 90 tablet 1   Current Facility-Administered Medications on File Prior to Visit  Medication Dose Route Frequency Provider Last Rate Last Dose  . ondansetron (ZOFRAN-ODT) disintegrating tablet 4 mg  4 mg Oral Once Doristine Bosworth, MD        Allergies  Allergen Reactions   . Lisinopril     Past Medical History:  Diagnosis Date  . Degenerative disc disease   . Diabetes mellitus without complication (HCC)   . Hypertension   . Pruritus    Social History   Social History Narrative  . Not on file   Social History   Tobacco Use  . Smoking status: Never Smoker  . Smokeless tobacco: Never Used  Substance Use Topics  . Alcohol use: No  . Drug use: No   Family history is unknown by patient.     Objective:  BP (!) 142/56 (BP Location: Right Arm)   Pulse 76   Temp 99 F (37.2 C) (Oral)   Resp 16   Ht 5' (1.524 m)   Wt 106 lb 6.4 oz (48.3 kg)   SpO2 98%   BMI 20.78 kg/m  Body mass index is 20.78 kg/m.  Physical Exam  Constitutional: She is oriented to person, place, and time. She appears well-developed and well-nourished.  HENT:  Head: Normocephalic and atraumatic.  Right Ear: Hearing and external ear normal.  Left Ear: Hearing and external ear normal.  Eyes: Conjunctivae are normal.  Neck: Normal range of motion.  Cardiovascular: Normal rate and regular rhythm.  Murmur (2/6 systolic) heard. Pulmonary/Chest: Effort normal and breath sounds normal. She has no wheezes.  Abdominal: Soft. Normal appearance and bowel sounds are normal.    Neurological: She is alert and oriented to person, place, and time.  Skin: Skin is warm and dry.  Psychiatric: She has a normal mood and affect. Judgment normal.  Seems anxious and frustrated in room  Vitals reviewed.  Dg Chest 2 View  Result Date: 04/18/2018 CLINICAL DATA:  Cough for 1 week. Posterior right sided rhonchi. Left-sided wheezing. EXAM: CHEST - 2 VIEW COMPARISON:  Radiographs 02/05/2018 and 01/03/2018. FINDINGS: The heart size and mediastinal contours are stable. There is aortic atherosclerosis. Mild central airway thickening is present without focal airspace disease, hyperinflation or pleural effusion. There are mild degenerative changes throughout the spine status post lower cervical  fusion. IMPRESSION: Mild central airway thickening suggesting bronchitis. No evidence of pneumonia. Aortic atherosclerosis. Electronically Signed   By: Carey Bullocks M.D.   On: 04/18/2018 10:23   Dg Abd Acute W/chest  Result Date: 04/21/2018 CLINICAL DATA:  Left lower quadrant pain EXAM: DG ABDOMEN ACUTE W/ 1V CHEST COMPARISON:  Chest two-view 04/18/2018 FINDINGS: Mild cardiac enlargement without heart failure. Lungs are clear without infiltrate effusion or mass Normal bowel gas pattern. Negative for free air. Moderate stool in the right colon. No abnormal calcifications. Lumbar spine degenerative change. IMPRESSION: Negative abdominal radiographs.  No acute cardiopulmonary disease. Electronically Signed   By: Marlan Palau M.D.   On: 04/21/2018 13:27    Assessment and Plan :  LLQ pain - Plan: POCT CBC, DG Abd Acute W/Chest, dicyclomine (BENTYL) 10 MG capsule, docusate sodium (COLACE) 100 MG capsule   There is no clear explanation for the patient's abdominal pain at this time.  It did start about the time that  she started her doxycycline so we will stop the doxycycline at this point.  Her chest x-ray today did not show the airway thickening as it did 3 days ago.  We will also treat her for some possible constipation with Colace.  She will increase her fluid intake and she will monitor her temperature.  Did discuss with patient warning signs and feel very confident that if patient gets worse she will return to clinic.  She was advised if we were not open to go to the emergency room if she has any of the warning signs we discussed.  Her husband was in the room and voiced agreement.  Patient seemed frustrated at the end of the appointment because I did not prescribe yet another antibiotic.  Did discuss with patient and husband that risk of CT with contrast is to high and will be done if her pain worsens or CBC worsens.  D/w Dr Coy Saunas PA-C  Primary Care at [facility] 04/21/2018 3:35 PM

## 2018-04-21 NOTE — Patient Instructions (Addendum)
Stop Doxycycline  Start medication for cramping called bentyl Start colace to help with the possibility of constipation due to the Doxycycline  If fevers or increasing abd pain - seek additional medical advice      IF you received an x-ray today, you will receive an invoice from [facility]. Please contact [facility] at [phone] with questions or concerns regarding your invoice.   IF you received labwork today, you will receive an invoice from [facility]. Please contact [facility] at [phone] with questions or concerns regarding your invoice.   Our billing staff will not be able to assist you with questions regarding bills from these companies.  You will be contacted with the lab results as soon as they are available. The fastest way to get your results is to activate your My Chart account. Instructions are located on the last page of this paperwork. If you have not heard from [place] regarding the results in 2 weeks, please contact this office.

## 2018-04-22 ENCOUNTER — Encounter: Payer: Self-pay | Admitting: Physician Assistant

## 2018-04-22 NOTE — Telephone Encounter (Signed)
Noted  

## 2018-04-22 NOTE — Telephone Encounter (Signed)
The patient was evaluated in the office yesterday by PA Weber.  It does not appear that she mention anything about her cough at that time.  Please let her know if symptoms persist, she should return to office for further evaluation. Thanks!

## 2018-06-15 IMAGING — CR DG FOOT COMPLETE 3+V*L*
3 series · 3 of 3 positions shown · non-contrast
Comparison: None.

CLINICAL DATA: Fall, injury

EXAM:
LEFT FOOT - COMPLETE 3+ VIEW

[x foot obl left]
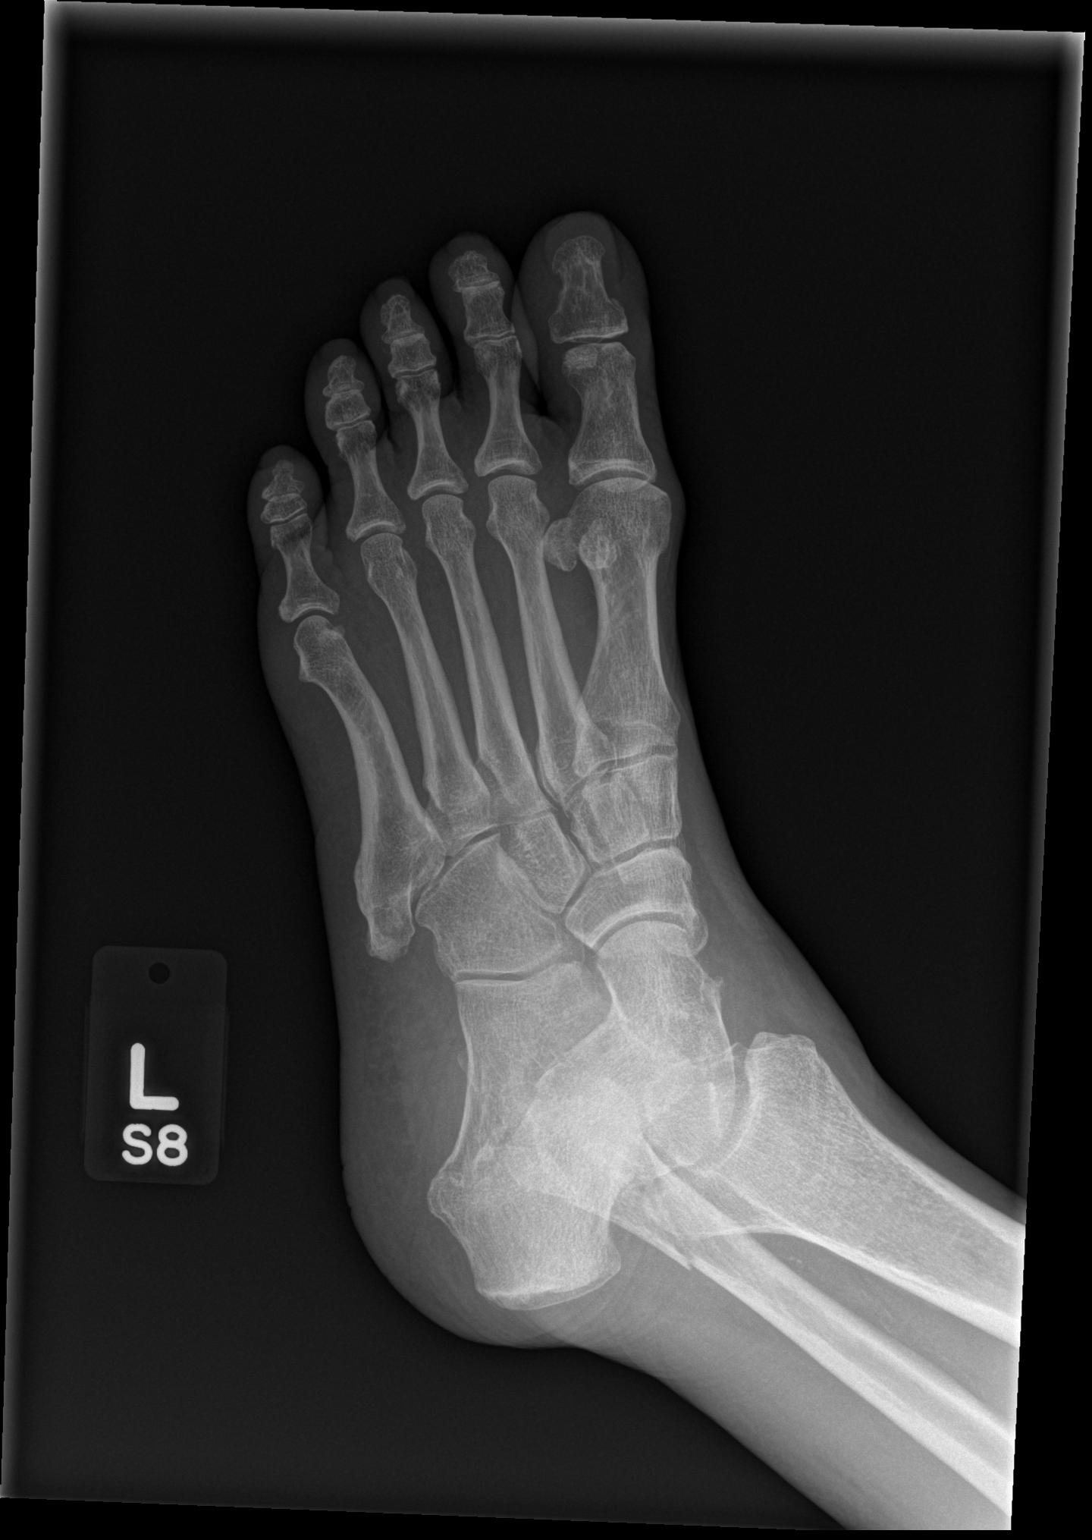

[x foot ap left]
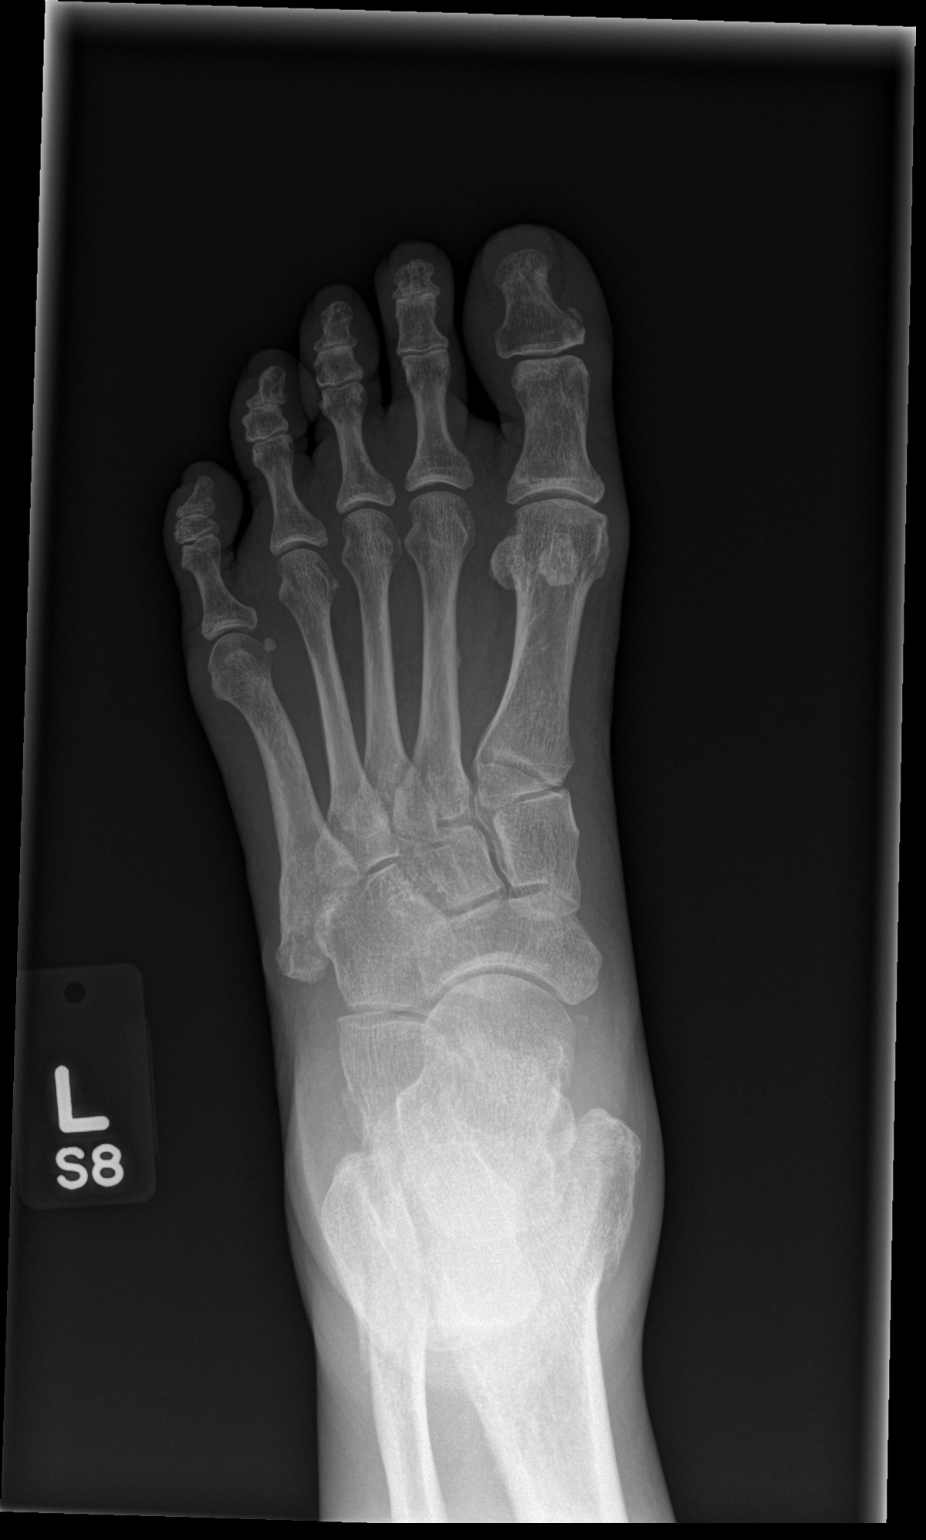

[x foot lat left]
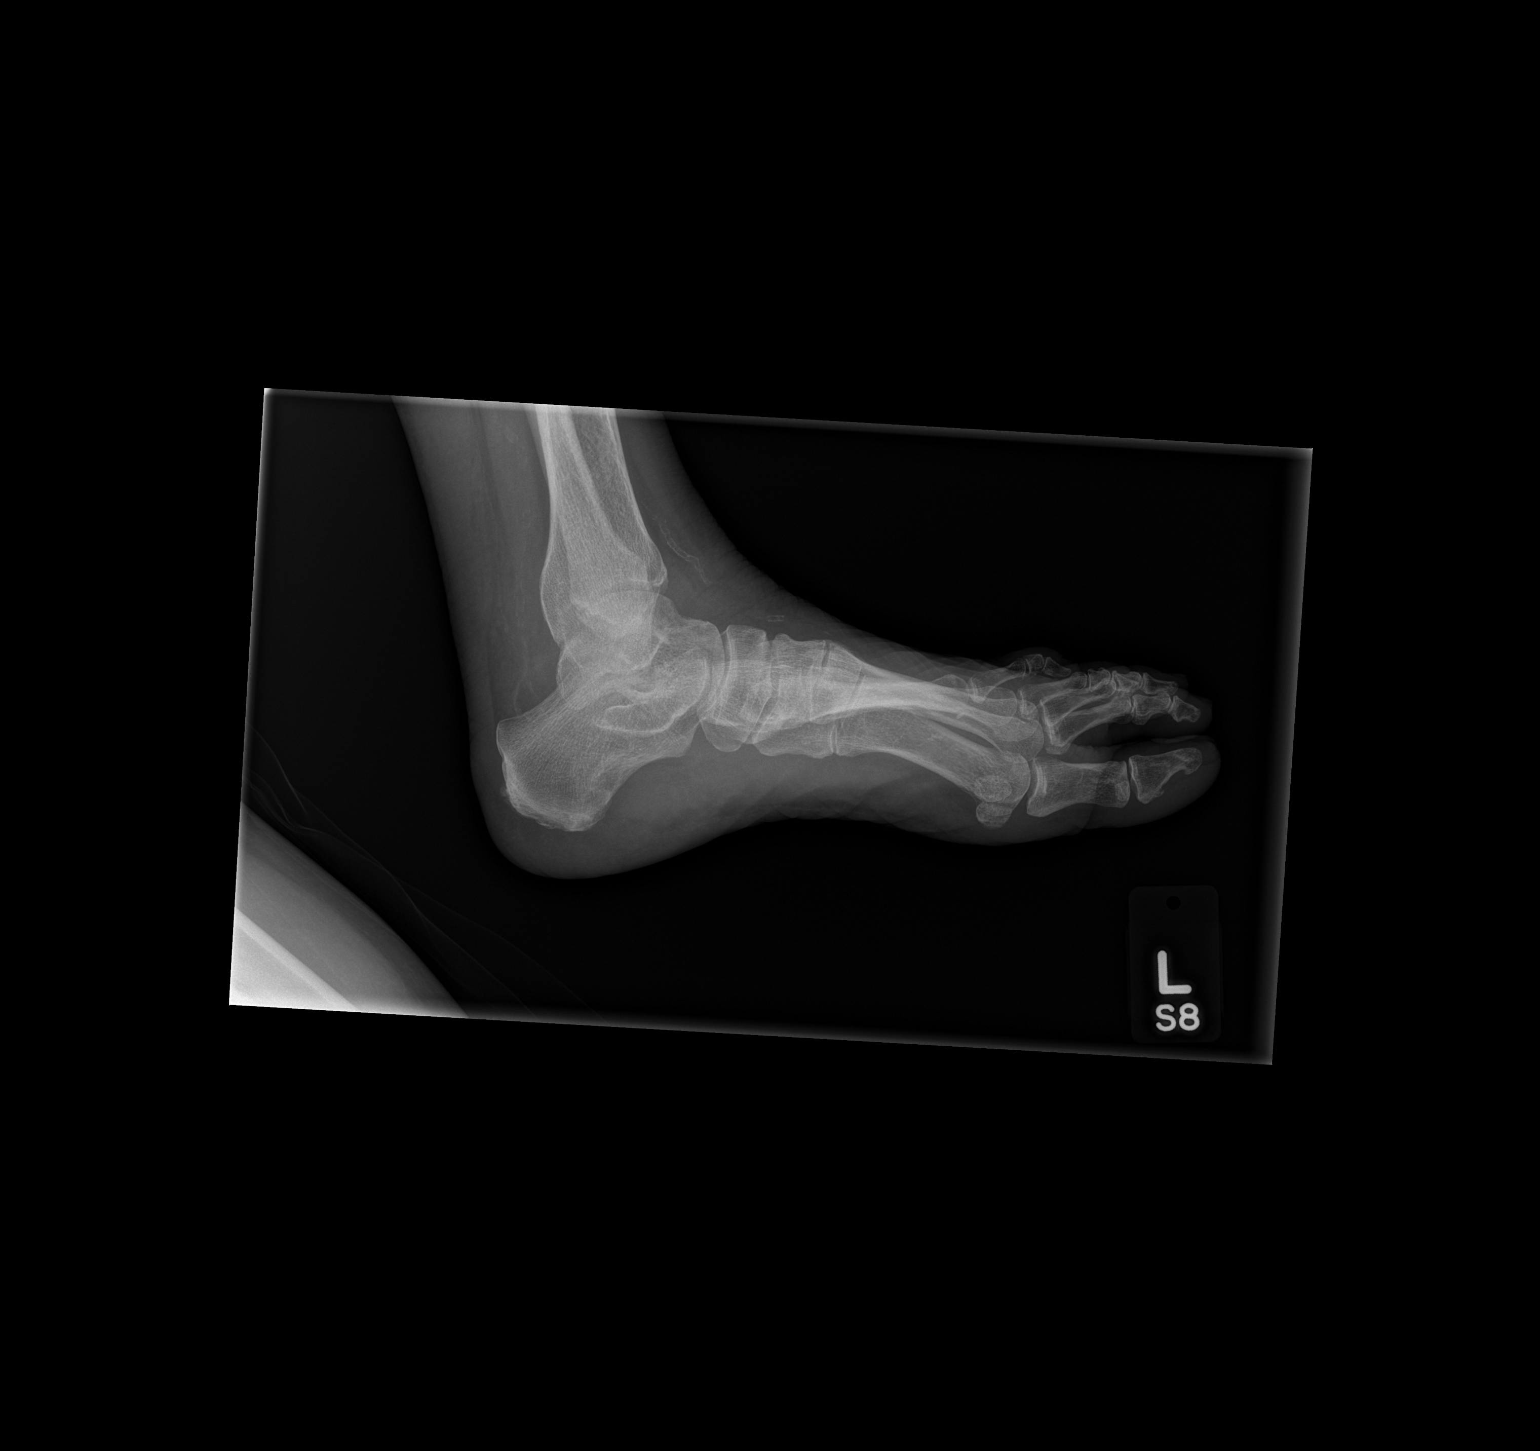

[3 of 3 positions shown; findings below may reference images not displayed]

FINDINGS: Three views of the left foot submitted. No acute fracture or
subluxation. There is spurring at the base of fifth metatarsal.
There is minimal displaced oblique fracture in distal fibula.
IMPRESSION: No left foot acute fracture or subluxation. Spurring at the base of
fifth metatarsal. Minimal displaced oblique fracture in distal left
fibula.

## 2018-06-15 IMAGING — CR DG ANKLE COMPLETE 3+V*L*
3 series · 3 of 3 positions shown · non-contrast
Comparison: Study obtained earlier in the day

CLINICAL DATA: Pain following fall 8 days prior

EXAM:
LEFT ANKLE COMPLETE - 3+ VIEW

[x ankle ap left]
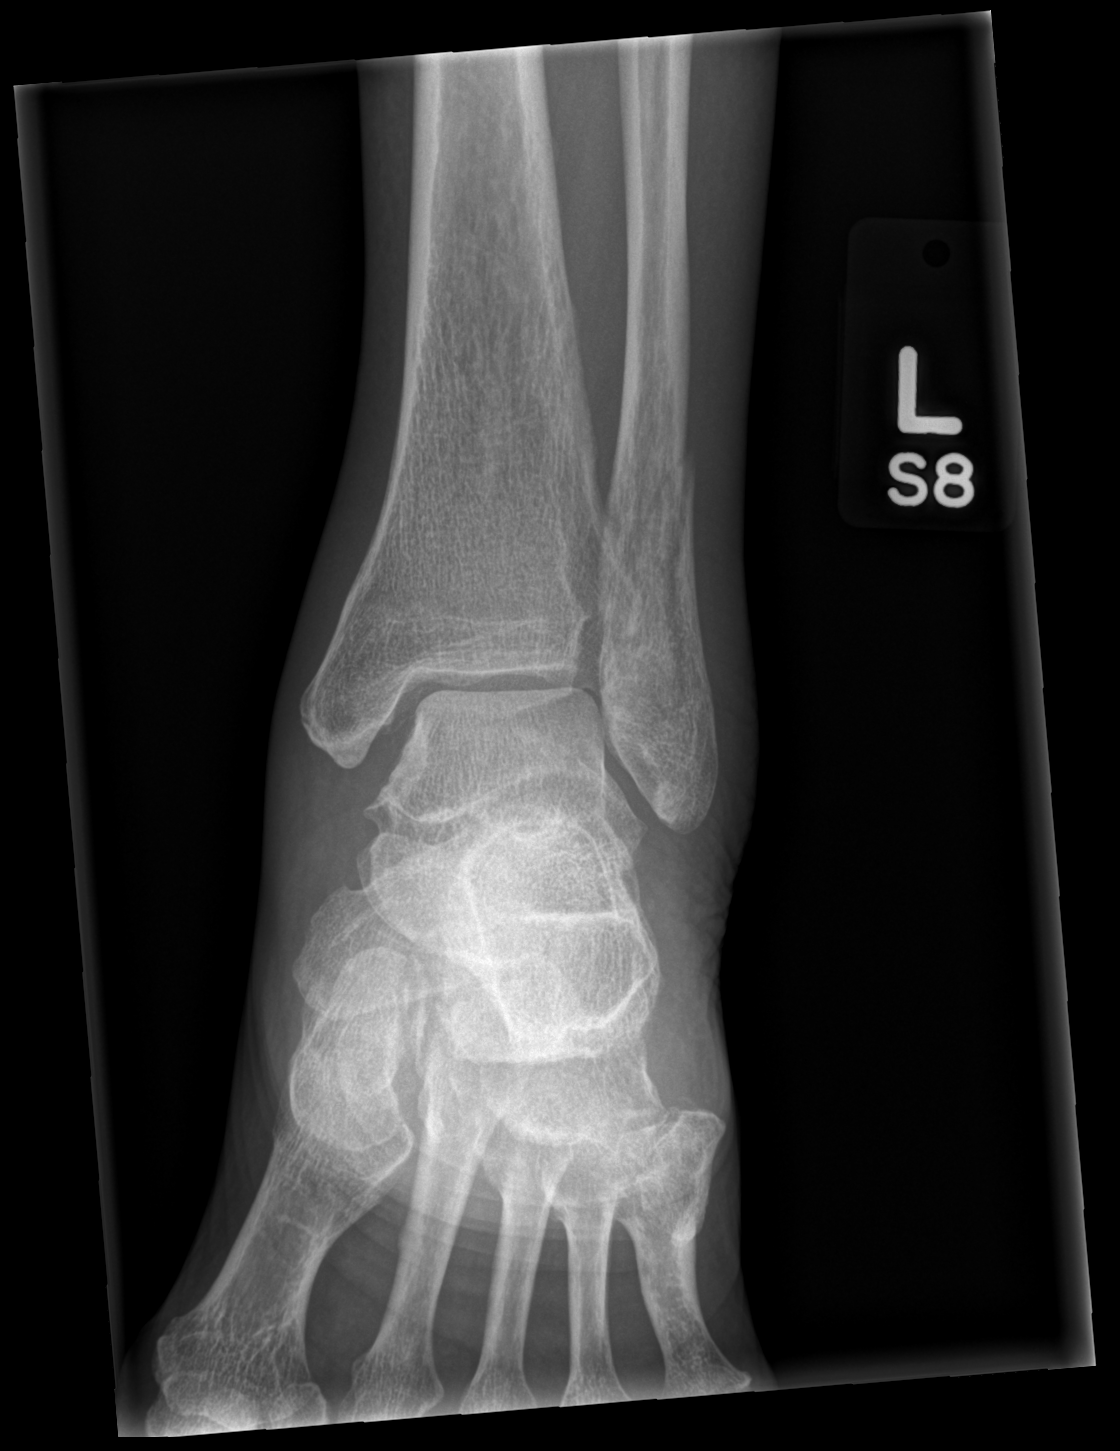

[x ankle obl left]
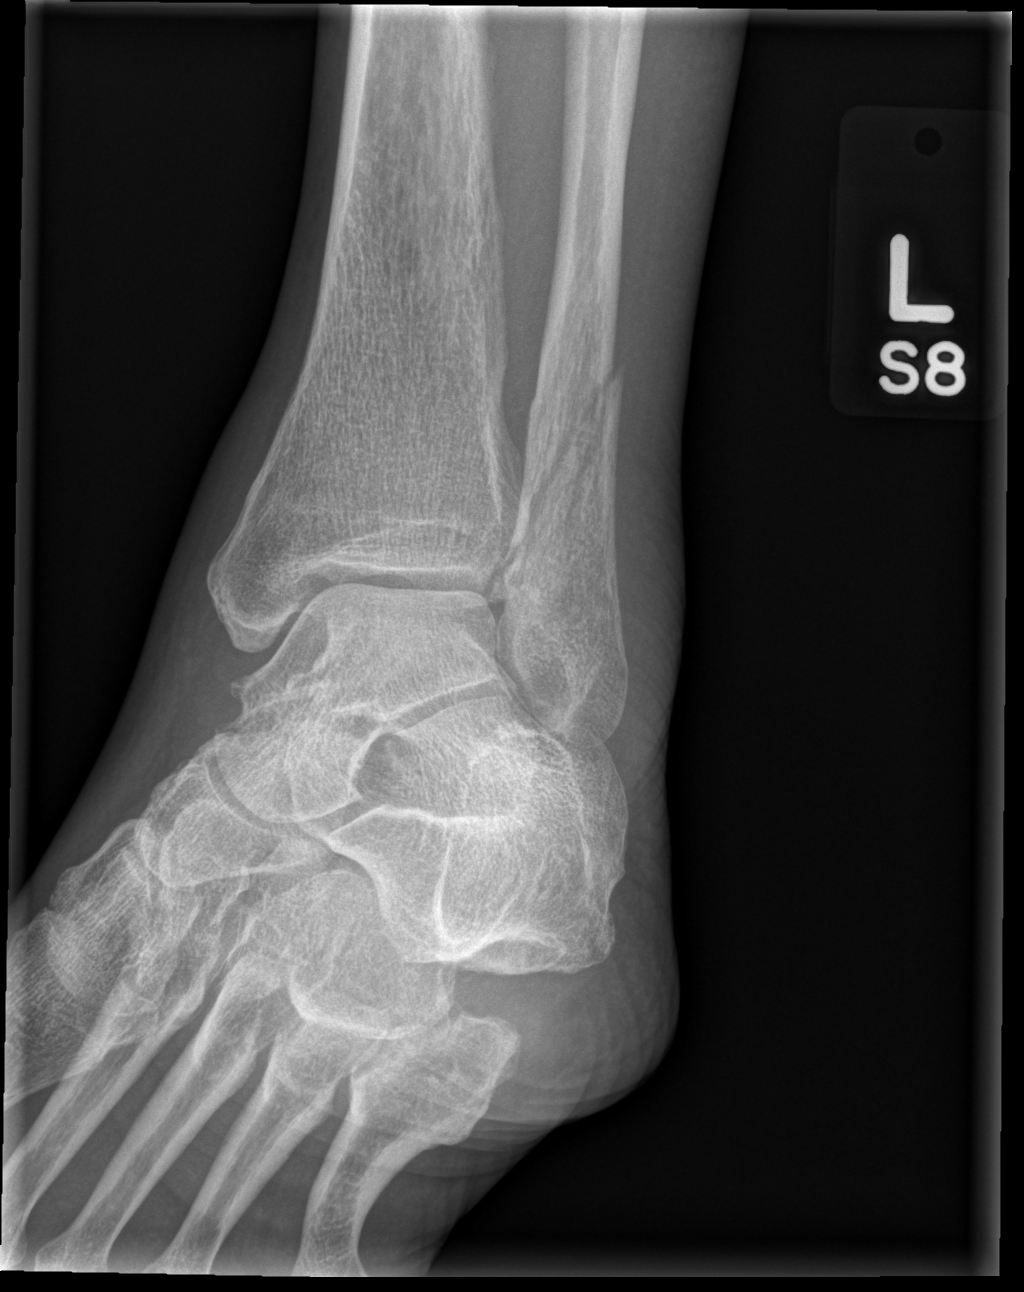

[x ankle lat left]
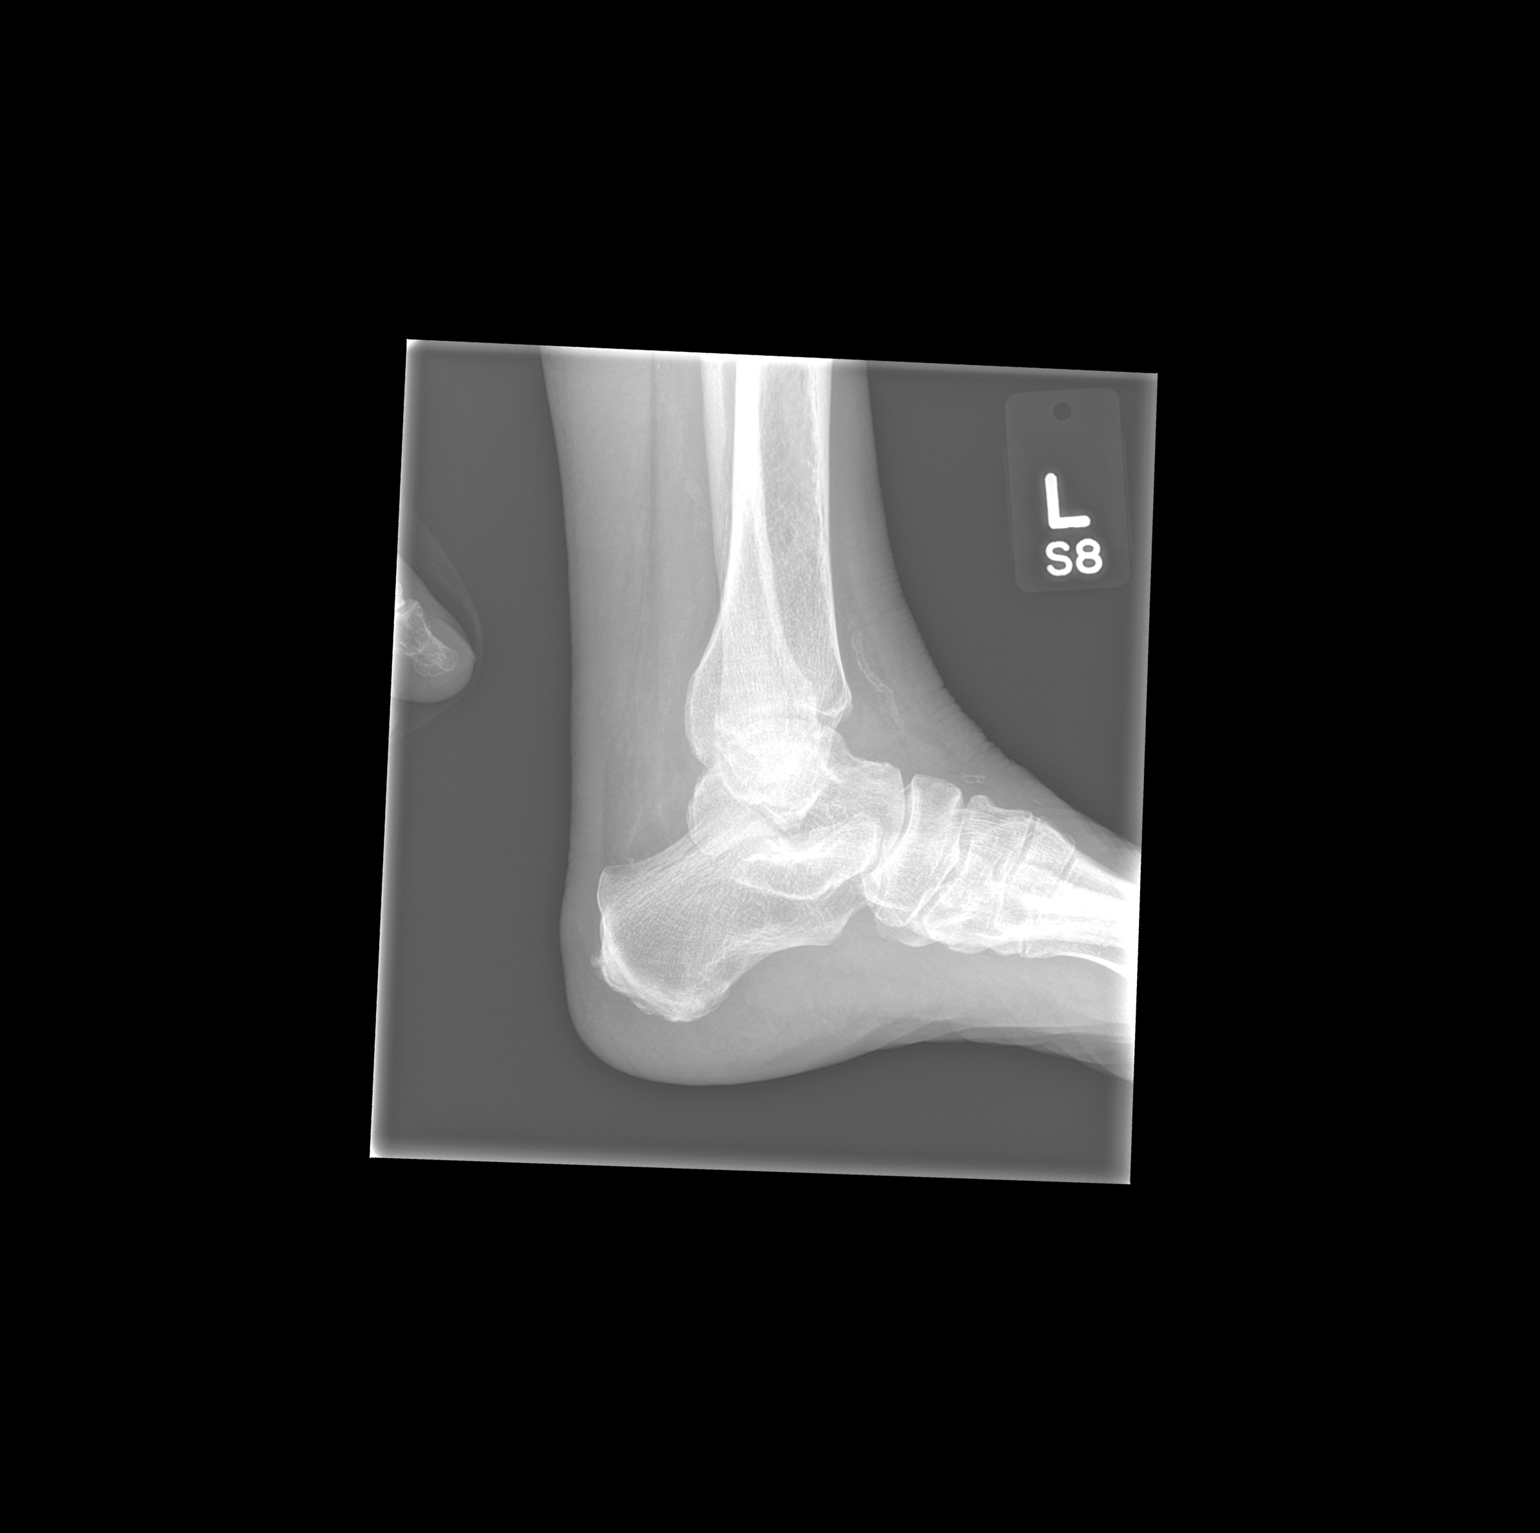

[3 of 3 positions shown; findings below may reference images not displayed]

FINDINGS: Frontal, oblique, and lateral views were obtained. There is again
noted an obliquely oriented fracture of the distal fibular diaphysis
with slight lateral displacement of the distal fracture fragment
with respect proximal fragment. No other fracture evident. There is
a joint effusion. The ankle mortise appears grossly intact. There is
no appreciable joint space narrowing. There are foci of arterial
vascular calcification.
IMPRESSION: Mildly displaced obliquely oriented fracture distal fibular
diaphysis with ankle joint effusion. Ankle mortise appears grossly
intact. Aortic atherosclerotic vascular calcification noted
anteriorly.

## 2018-08-02 ENCOUNTER — Other Ambulatory Visit: Payer: Self-pay

## 2018-08-02 ENCOUNTER — Other Ambulatory Visit: Payer: Self-pay | Admitting: Physician Assistant

## 2018-08-02 ENCOUNTER — Telehealth: Payer: Self-pay

## 2018-08-02 ENCOUNTER — Encounter: Payer: Self-pay | Admitting: Family Medicine

## 2018-08-02 ENCOUNTER — Ambulatory Visit (INDEPENDENT_AMBULATORY_CARE_PROVIDER_SITE_OTHER): Payer: Medicare Other | Admitting: Family Medicine

## 2018-08-02 VITALS — BP 150/52 | HR 71 | Temp 99.0°F | Ht 59.65 in | Wt 106.4 lb

## 2018-08-02 DIAGNOSIS — E119 Type 2 diabetes mellitus without complications: Secondary | ICD-10-CM

## 2018-08-02 DIAGNOSIS — R531 Weakness: Secondary | ICD-10-CM | POA: Diagnosis not present

## 2018-08-02 DIAGNOSIS — E785 Hyperlipidemia, unspecified: Secondary | ICD-10-CM

## 2018-08-02 DIAGNOSIS — E11649 Type 2 diabetes mellitus with hypoglycemia without coma: Secondary | ICD-10-CM

## 2018-08-02 DIAGNOSIS — I10 Essential (primary) hypertension: Secondary | ICD-10-CM | POA: Diagnosis not present

## 2018-08-02 DIAGNOSIS — Z7984 Long term (current) use of oral hypoglycemic drugs: Secondary | ICD-10-CM

## 2018-08-02 LAB — POCT GLYCOSYLATED HEMOGLOBIN (HGB A1C): Hemoglobin A1C: 5.4 % (ref 4.0–5.6)

## 2018-08-02 MED ORDER — BLOOD GLUCOSE MONITOR KIT: PACK | 3 refills | Status: DC

## 2018-08-02 MED ORDER — GLIMEPIRIDE 1 MG PO TABS: 0.5000 mg | ORAL_TABLET | Freq: Every day | ORAL | 2 refills | Status: DC

## 2018-08-02 MED ORDER — AMLODIPINE BESYLATE 10 MG PO TABS: 10.0000 mg | ORAL_TABLET | Freq: Every day | ORAL | 1 refills | Status: DC

## 2018-08-02 MED ORDER — AMLODIPINE BESYLATE 10 MG PO TABS
10.0000 mg | ORAL_TABLET | Freq: Every day | ORAL | 1 refills | Status: DC
Start: 2018-08-02 — End: 2018-08-04

## 2018-08-02 NOTE — Progress Notes (Signed)
8/13/20192:33 PM  Robin Andrews [birth year], 84 y.o. female [id number]  Chief Complaint  Patient presents with  . Hypertension    feels very tired all the time and is asking for another medication to help regulate bp and diabetes    HPI:   Patient is a 84 y.o. female with past medical history significant for HTN, HLP, DM2, who presents today for followup  Use on vietnamese interpreter She is here with her husband  She is worried about blood pressure is get high in morning, around 4am SBP as high as 195, gets headache She takes her current dose of amlodipine 23m after breakfast around 730am, does not recheck bp afterwards Does not check blood glucose Takes glimperide twice a day Denies low sugars but feels very tired in the mornings  Lab Results  Component Value Date   CHOL 235 (H) 12/18/2017   HDL 49 12/18/2017   LDLCALC 106 (H) 12/18/2017   TRIG 398 (H) 12/18/2017   CHOLHDL 4.8 (H) 12/18/2017    Lab Results  Component Value Date   HGBA1C 5.5 12/18/2017   Lab Results  Component Value Date   CREATININE 0.77 02/05/2018   CREATININE 0.89 12/18/2017   CREATININE 0.80 07/02/2017    Fall Risk  08/02/2018 04/21/2018 04/18/2018 02/05/2018 01/04/2018  Falls in the past year? No No No No No  Number falls in past yr: - - - - -  Injury with Fall? - - - - -     [facility] 08/02/2018 04/21/2018 04/18/2018  Decreased Interest 0 0 0  Down, Depressed, Hopeless 0 0 0  PHQ - 2 Score 0 0 0    Allergies  Allergen Reactions  . Lisinopril     Prior to Admission medications   Medication Sig Start Date End Date Taking? Authorizing Provider  amLODipine (NORVASC) 5 MG tablet Take 1 tablet (5 mg total) by mouth daily. 07/02/17  Yes McVey, EGelene Mink PA-C  glimepiride (AMARYL) 1 MG tablet Take 1 tablet (1 mg total) by mouth daily with breakfast. 07/02/17  Yes McVey, EGelene Mink PA-C    Past Medical History:  Diagnosis Date  . Degenerative disc disease   .  Diabetes mellitus without complication ([place]   . Hypertension   . Pruritus     Past Surgical History:  Procedure Laterality Date  . DILATION AND CURETTAGE, DIAGNOSTIC / THERAPEUTIC    . NECK SURGERY      Social History   Tobacco Use  . Smoking status: Never Smoker  . Smokeless tobacco: Never Used  Substance Use Topics  . Alcohol use: No    Family History  Family history unknown: Yes    Review of Systems  Constitutional: Negative for chills and fever.  Respiratory: Negative for cough and shortness of breath.   Cardiovascular: Negative for chest pain, palpitations and leg swelling.  Gastrointestinal: Negative for abdominal pain, nausea and vomiting.     OBJECTIVE:  Blood pressure (!) 150/52, pulse 71, temperature 99 F (37.2 C), temperature source Oral, height 4' 11.65" (1.515 m), weight 106 lb 6.4 oz (48.3 kg), SpO2 98 %. Body mass index is 21.03 kg/m.   Wt Readings from Last 3 Encounters:  08/02/18 106 lb 6.4 oz (48.3 kg)  04/21/18 106 lb 6.4 oz (48.3 kg)  04/18/18 107 lb 9.6 oz (48.8 kg)    Physical Exam  Constitutional: She is oriented to person, place, and time. She appears well-developed and well-nourished.  Patient if thin and fraile  HENT:  Head: Normocephalic and atraumatic.  Mouth/Throat: Oropharynx is clear and moist. No oropharyngeal exudate.  Eyes: Pupils are equal, round, and reactive to light. EOM are normal. No scleral icterus.  Neck: Neck supple.  Cardiovascular: Normal rate, regular rhythm and normal heart sounds. Exam reveals no gallop and no friction rub.  No murmur heard. Pulmonary/Chest: Effort normal and breath sounds normal. She has no wheezes. She has no rales.  Musculoskeletal: She exhibits no edema.  Neurological: She is alert and oriented to person, place, and time.  Skin: Skin is warm and dry.  Nursing note and vitals reviewed.   Results for orders placed or performed in visit on 08/02/18 (from the past 24 hour(s))  POCT A1C      Status: Normal   Collection Time: 08/02/18  3:06 PM  Result Value Ref Range   Hemoglobin A1C 5.4 4.0 - 5.6 %   HbA1c POC (<> result, manual entry)     HbA1c, POC (prediabetic range)     HbA1c, POC (controlled diabetic range)        ASSESSMENT and PLAN  1. Controlled type 2 diabetes mellitus without complication, without long-term current use of insulin (HCC) Tightly controlled. Concern for increased risk of hypoglycemia in elderly that lives alone. Tried to stop glimperide and patient became very upset and fearful as she believes that she will die from very high sugars without this medication. compromised reach would be 1/2 mg of glimperide once a day with checking blood glucose levels twice a day, fasting and 2 hours after largest meal.  Patient educational information provided regarding low sugars. followup in 2 weeks to review glucose readings  - TSH - CMP14+EGFR - POCT A1C - Vitamin B12 - Microalbumin/Creatinine Ratio, Urine - glimepiride (AMARYL) 1 MG tablet; Take 0.5 tablets (0.5 mg total) by mouth daily with breakfast.  2. HYPERTENSION, BENIGN ESSENTIAL Uncontrolled. Increasing amlodipine to 74m once a day. Continue home monitoring of BP - CMP14+EGFR - amLODipine (NORVASC) 10 MG tablet; Take 1 tablet (10 mg total) by mouth daily.  3. Hyperlipidemia, unspecified hyperlipidemia type Checking labs today, medications will be adjusted as needed.  - TSH - CMP14+EGFR - Lipid panel  4. Weakness Concerned that experienced weakness if due to low cbgs. Checking for other causes.  - TSH - CBC with Diffferential/Platelet - Vitamin B12  5. Hypoglycemia due to type 2 diabetes mellitus ([place] Suspicion of this. Checking cbgs at home to further evalauate Other orders - blood glucose meter kit and supplies KIT; Dispense on insurance preference. Check cbg every morning before breakfast and when feeling tired.. DX E11.9, E11.649  Return in about 2 weeks (around 08/16/2018) for blood  sugar readings.    IRutherford Guys MD [facility] [place] [id number].  [phone] [phone]

## 2018-08-02 NOTE — Telephone Encounter (Signed)
Medication was resent to the pharmacy. Pharmacy has tech issues when original rx was sent earlier

## 2018-08-02 NOTE — Patient Instructions (Addendum)
I will contact you with your lab results within the next 2 weeks.  If you have not heard from us then please contact us. The fastest way to get your results is to register for My Chart.   IF you received an x-ray today, you will receive an invoice from [facility]. Please contact [facility] at [phone] with questions or concerns regarding your invoice.   IF you received labwork today, you will receive an invoice from [facility]. Please contact [facility] at [phone] with questions or concerns regarding your invoice.   Our billing staff will not be able to assist you with questions regarding bills from these companies.  You will be contacted with the lab results as soon as they are available. The fastest way to get your results is to activate your My Chart account. Instructions are located on the last page of this paperwork. If you have not heard from us regarding the results in 2 weeks, please contact this office.     H? ???ng huy?t (Hypoglycemia) Ha? ????ng huy?t xa?y ra khi l???ng ????ng (glucose) trong ma?u qua? th?p. Glucose l m?t lo?i ???ng cung c?p ngu?n n?ng l??ng chnh cho c? th? qu v?. Nh?ng hoc-mn nh?t ?i?nh (insulin va? glucagon) ki?m sot l??ng glucose trong mu. Insulin lm gi?m l??ng glucose trong mu v glucagon lm t?ng l??ng glucose trong mu. H? ???ng huy?t c th? x?y ra do c qu nhi?u insulin trong mu, ho?c do khng ?n ?? th?c ?n ch?a glucose. H? ???ng huy?t c th? x?y ra ? nh?ng ng??i b? ho?c khng b? ti?u ???ng. N c th? pht tri?n nhanh chng v c th? l m?t tnh hu?ng c?p c?u. NGUYN NHN H? ???ng huy?t x?y ra th??ng xuyn nh?t ? nh?ng ng??i b? ti?u ???ng. N?u qu v? b? ti?u ???ng, h? ???ng huy?t c th? x?y ra do:  Thu?c tr? ti?u ???ng.  Khng ?n ??, ho?c khng ?n ?? th??ng xuyn.  T?ng ho?t ??ng thn th?.  U?ng r??u, ??c bi?t khi qu v? ch?a ?n g. N?u qu v? khng b? ti?u ???ng, h? ???ng huy?t c th? x?y ra do:  M?t kh?i u ?  t?y. T?y l c? quan t?o ra insulin.  Khng ?n ?u?, ho??c khng ?n trong m?t th??i gian da?i (nhi?n ?o?i).  Nhi?m trng n?ng ho?c b? ?m ?nh h??ng ??n gan, tim ho?c th?n.  M?t s? lo?i thu?c nh?t ??nh. Qu v? c?ng c th? b? h? ???ng huy?t ph?n ?ng. Tnh tr?ng ny gy h? ???ng huy?t trong vng 4 gi? sau khi ?n. Tnh tr?ng ny c th? x?y ra sau ph?u thu?t d? dy. ?i khi, nguyn nhn gy h? ???ng huy?t ph?n ?ng l khng r. CC Y?U T? NGUY C? Ha? ????ng huy?t hay xa?y ra h?n ??:  Nh??ng ng???i b? b?nh ti?u ???ng v dng thu?c ?? gi?m glucose trong mu.  Nh??ng ng??i l?m d?ng r??u.  Nh?ng ng??i b? ?m n?ng. TRI?U CH?NG H? ???ng huy?t c th? khng gy tri?u ch?ng no. N?u qu v? c tri?u ch?ng, cc tri?u ch?ng ? c th? bao g?m:  ?i.  Lo u.  ?? m? hi v c?m th?y ?m ??t.  L l?n.  Chng m?t ho?c c?m th?y chong vng.  Bu?n ng?.  Bu?n nn.  Nh?p tim t?ng.  ?au ??u.  Nhn m?.  ??ng kinh.  c m?ng.  ?au nhi ho?c t ? quanh mi?ng, mi ho?c l??i.  Thay ??i l??i no?i.  Gi?m kh? n?ng t?p trung.  Thay ??i s?? ph?i h??p.  Ng? khng yn.  Run ho?c l?c.  Ng?t x?u.  D? b? kch thch. CH?N ?ON H? ???ng huy?t ???c ch?n ?on nh? xt nghi?m mu ?o l??ng glucose trong mu c?a qu v?. Xt nghi?m mu ny ???c th?c hi?n trong khi qu v? ?ang c tri?u ch?ng. Chuyn gia ch?m [place] s?c kh?e c?a qu v? c?ng c th? khm th?c th? v khai thc b?nh s? c?a qu v?. N?u qu v? khng b? ti?u ???ng, cc xt nghi?m khc c th? ???c lm ?? tm nguyn nhn gy h? ???ng huy?t. ?I?U TR? Tnh tr?ng ny th??ng c th? ???c ?i?u tr? b?ng cch ?n ho?c u?ng ngay th? g ? ch?a glucose, ch?ng h?n nh?:  3-4 vin ???ng (vin glucose).  Glucose d?ng gel, ?ng 15 gam.  N??c p tri cy, 4 ao-x? (120 mL).  Soda th???ng (khng pha?i loa?i cho ng???i ?n king), 4 ao-x? (120 mL).  S?a t bo, 4 ao-x? (120 mL).  Vi vin k?o c?ng.  ???ng ho?c m?t ong, 1 tha c ph. ?i?u tr? h? ???ng huy?t n?u  qu v? b? ti?u t??ng N?u qu v? t?nh to v c th? nu?t m?t cch an ton, hy theo quy t?c 15:15:  Dng 15 gam carbohydrate c tc d?ng nhanh. Cc l?a ch?n tc d?ng nhanh bao g?m: ? 1 ?ng glucose gel. ? 3 vin ???ng glucose. ? 6-8 vin k?o c?ng. ? 4 ao-x? (120 mL) n??c p tri cy. ? 4 ao-x? (120 mL) soda th???ng (khng pha?i loa?i cho ng???i ?n king).  Ki?m tra ???ng huy?t c?a qu v? sau khi du?ng carbohydrate 15 pht.  N?u l??ng ???ng huy?t ?o l?i v?n ? m?c 70 mg/dL (3,9 mmol/L) tr? xu?ng, ha?y la?i dng ti?p t? 15 gam carbohydrate.  N?u l??ng ???ng huy?t cu?a quy? vi? khng t?ng qu 70 mg/dL (3,9 mmol/L) sau 3 l?n th??, quy? vi? c?n ???c ch?m so?c y t? kh?n c?p.  Sau khi n?ng ?? glucose trong ma?u tr? l?i bnh th??ng, ha?y ?n m?t b?a ?n chi?nh ho?c m?t b??a ?n nh? trong vng 1 ti?ng. ?i?u tr? h? ???ng huy?t nghim tr?ng H? ???ng huy?t nghim tr?ng l khi l??ng ???ng huy?t c?a qu v? t? 54 mg/dL (3 mmol/L) tr? xu?ng. H? ???ng huy?t nghim tr?ng l m?t tnh hu?ng c?p c?u. Khng ch? xem tri?u ch?ng c h?t khng. Hy ?i khm ngay l?p t?c. G?i cho d?ch v? c?p c?u t?i ??a ph??ng (911 ? [place] Khng t? li xe ??n b?nh vi?n. N?u qu v? b? h? ???ng huy?t nghim tr?ng v qu v? khng th? ?n ho?c u?ng, qu v? c th? c?n tim glucagon. M?t ng??i trong gia ?nh ho?c b?n thn c?n h?c cch ki?m tra ???ng huy?t cho qu v? v cch tim glucagon cho qu v?. Hy h?i chuyn gia ch?m [place] s?c kh?e xem qu v? c c?n c s?n m?t b? ?? tim glucagon kh?n c?p khng. H? ???ng huy?t nghim tr?ng c th? c?n ???c ?i?u tr? trong b?nh vi?n. Vi?c ?i?u tr? c th? bao g?m truy?n glucose qua ???ng truy?n t?nh m?ch (IV). Qu v? c?ng c th? c?n ?i?u tr? nguyn nhn gy h? ???ng huy?t. H??NG D?N CH?M [place] T?I NH H??ng d?n chung  Trnh b?t k? ch? ?? ?n no lm cho qu v? khng ?n ?? th?c ?n. Ni v?i chuyn gia ch?m [place] s?c kh?e tr??c khi qu v? b?t ??u b?t k? ch? ?? ?n m?i no.  Ch?  s? d?ng thu?c khng c?n k ??n v  thu?c c?n k ??n theo ch? d?n c?a chuyn gia ch?m [place] s?c kh?e.  Gi?i h?n l??ng r??u qu v? u?ng khng qu 1 ly m?i ngy v?i ph? n? khng mang thai v 2 ly m?i ngy v?i nam gi?i. M?t ly t??ng ???ng v?i 12 ao-x? bia, 5 ao-x? r??u vang, ho?c 1 ao-x? r??u m?nh.  Tun th? t?t c? cc cu?c h?n khm l?i theo ch? d?n c?a chuyn gia ch?m [place] s?c kh?e. ?i?u ny c vai tr quan tr?ng. N?u qu v? b? ti?u ???ng:  B?o ??m qu v? bi?t cc tri?u ch?ng c?a h? ???ng huy?t.  Lun mang theo ?? ?n nhanh ch?a carbohydrate tc d?ng nhanh ?? ?i?u tr? h? ???ng huy?t.  Lm theo k? ho?ch qu?n l ???ng huy?t c?a qu v?, nh? ch? d?n c?a chuyn gia ch?m [place] s?c kh?e. ??m b?o qu v?: ? Dng thu?c theo ch? d?n. ? Lm theo k? ho?ch t?p luy?n. ? Lm theo k? ho?ch ?n u?ng. ?n ?ng gi? khng b? b?a. ? Ki?m tra ????ng huy?t cu?a quy? vi? th???ng xuyn theo chi? d?n. Hy ch?c ch?n vi?c ki?m tra l??ng ???ng huy?t tr??c v sau khi t?p th? d?c. N?u qu v? t?p th? d?c ko di h?n ho?c khc v?i bnh th??ng, hy ki?m tra l??ng ???ng huy?t th??ng xuyn h?n. ? Lm theo k? ho?ch trong ngy b? b?nh c?a qu v? b?t c? lc no m qu v? khng th? ?n ho?c u?ng ???c bnh th??ng. L?p k? ho?ch ny tr??c v?i chuyn gia ch?m [place] s?c kh?e.  Chia s? k? ho?ch qu?n l b?nh ti?u ???ng c?a qu v? ? n?i lm vi?c, tr??ng h?c va? gia ?i?nh quy? vi?.  Ki?m tra [place] trong n??c ti?u khi qu v? b? b?nh v theo ch? d?n c?a chuyn gia ch?m [place] s?c kh?e.  Mang theo th? c?nh bo y t? ho?c ?eo ?? trang s?c c c?nh bo y t?. N?u qu v? b? h? ???ng huy?t ph?n ?ng ho?c c l??ng ???ng huy?t th?p do nh?ng nguyn nhn khc:  Hy theo di l??ng glucose trong mu c?a qu v? theo ch? d?n c?a chuyn gia ch?m [place] s?c kh?e.  Tun th? ch? ??n c?a chuyn gia ch?m [place] s?c kh?e v? cc h?n ch? ?n ho?c u?ng. ?I KHM N?U:  Qu v? g?p v?n ?? v?i vi?c gi? l??ng glucose trong mu ? ph?m vi m?c tiu.  Qu v? c nh?ng c?n h? ???ng huy?t th??ng xuyn. NGAY L?P T?C ?I KHM  N?U:  Qu v? ti?p t?c c cc tri?u ch?ng h? ???ng huy?t sau khi ?n ho?c u?ng ?? g ? c ch?a ???ng.  L???ng ????ng huy?t cu?a quy? vi? l 54 mg/dL (3 mmol/L) ho?c th?p h?n.  Qu v? b? m?t c?n ??ng kinh.  Qu v? b? ng?t. Nh?ng tri?u ch?ng ny c th? l m?t v?n ?? nghim tr?ng c?n c?p c?u. Khng ch? xem tri?u ch?ng c h?t khng. Hy ?i khm ngay l?p t?c. G?i cho d?ch v? c?p c?u t?i ??a ph??ng (911 ? [place] Khng t? li xe ??n b?nh vi?n. Thng tin ny khng nh?m m?c ?ch thay th? cho l?i khuyn m chuyn gia ch?m [place] s?c kh?e ni v?i qu v?. Hy b?o ??m qu v? ph?i th?o lu?n b?t k? v?n ?? g m qu v? c v?i chuyn gia ch?m [place] s?c kh?e c?a qu v?. Document Released: 12/07/2005 Document Revised: 03/30/2016 Document Reviewed: 01/10/2016 Elsevier Interactive  Patient Education  2018 Elsevier Inc.  

## 2018-08-02 NOTE — Telephone Encounter (Signed)
Medications already sent today in another encounter.

## 2018-08-03 ENCOUNTER — Other Ambulatory Visit: Payer: Self-pay | Admitting: Physician Assistant

## 2018-08-03 DIAGNOSIS — E119 Type 2 diabetes mellitus without complications: Secondary | ICD-10-CM

## 2018-08-03 LAB — LIPID PANEL
Chol/HDL Ratio: 4.6 ratio — ABNORMAL HIGH (ref 0.0–4.4)
Cholesterol, Total: 188 mg/dL (ref 100–199)
HDL: 41 mg/dL (ref >39)
Triglycerides: 430 mg/dL — ABNORMAL HIGH (ref 0–149)

## 2018-08-03 LAB — CBC WITH DIFFERENTIAL/PLATELET
Basophils Absolute: 0.1 10*3/uL (ref 0.0–0.2)
Basos: 1 %
EOS (ABSOLUTE): 0.5 10*3/uL — ABNORMAL HIGH (ref 0.0–0.4)
Eos: 5 %
Hematocrit: 36.3 % (ref 34.0–46.6)
Hemoglobin: 12.2 g/dL (ref 11.1–15.9)
Immature Grans (Abs): 0 10*3/uL (ref 0.0–0.1)
Immature Granulocytes: 0 %
Lymphocytes Absolute: 2.2 10*3/uL (ref 0.7–3.1)
Lymphs: 22 %
MCH: 29.4 pg (ref 26.6–33.0)
MCHC: 33.6 g/dL (ref 31.5–35.7)
MCV: 88 fL (ref 79–97)
Monocytes Absolute: 0.6 10*3/uL (ref 0.1–0.9)
Monocytes: 6 %
Neutrophils Absolute: 6.6 10*3/uL (ref 1.4–7.0)
Neutrophils: 66 %
Platelets: 343 10*3/uL (ref 150–450)
RBC: 4.15 x10E6/uL (ref 3.77–5.28)
RDW: 13.1 % (ref 12.3–15.4)
WBC: 10 10*3/uL (ref 3.4–10.8)

## 2018-08-03 LAB — CMP14+EGFR
ALT: 17 IU/L (ref 0–32)
AST: 18 IU/L (ref 0–40)
Albumin/Globulin Ratio: 1.6 (ref 1.2–2.2)
Albumin: 4.5 g/dL (ref 3.5–4.7)
Alkaline Phosphatase: 78 IU/L (ref 39–117)
BUN/Creatinine Ratio: 23 (ref 12–28)
BUN: 17 mg/dL (ref 8–27)
Bilirubin Total: 0.3 mg/dL (ref 0.0–1.2)
CO2: 24 mmol/L (ref 20–29)
Calcium: 9.2 mg/dL (ref 8.7–10.3)
Chloride: 95 mmol/L — ABNORMAL LOW (ref 96–106)
Creatinine, Ser: 0.74 mg/dL (ref 0.57–1.00)
GFR calc Af Amer: 85 mL/min/{1.73_m2} (ref >59)
GFR calc non Af Amer: 74 mL/min/{1.73_m2} (ref >59)
Globulin, Total: 2.8 g/dL (ref 1.5–4.5)
Glucose: 100 mg/dL — ABNORMAL HIGH (ref 65–99)
Potassium: 3.9 mmol/L (ref 3.5–5.2)
Sodium: 136 mmol/L (ref 134–144)
Total Protein: 7.3 g/dL (ref 6.0–8.5)

## 2018-08-03 LAB — TSH: TSH: 0.611 u[IU]/mL (ref 0.450–4.500)

## 2018-08-03 LAB — VITAMIN B12: Vitamin B-12: 753 pg/mL (ref 232–1245)

## 2018-08-03 LAB — MICROALBUMIN / CREATININE URINE RATIO
Creatinine, Urine: 45.4 mg/dL
Microalb/Creat Ratio: 697.6 mg/g creat — ABNORMAL HIGH (ref 0.0–30.0)
Microalbumin, Urine: 316.7 ug/mL

## 2018-08-03 NOTE — Telephone Encounter (Signed)
[facility] calling and states they still have not received the refill request for amLODipine (NORVASC) 10 MG tablet and glimepiride (AMARYL) 1 MG tablet. Asking if they can be resent.

## 2018-08-03 NOTE — Telephone Encounter (Signed)
Patient husband is calling and states the pharmacy still has not received these medications. He is not understanding why they didn't receive them when they were called in. Patient husband is upset.

## 2018-08-04 ENCOUNTER — Other Ambulatory Visit: Payer: Self-pay | Admitting: *Deleted

## 2018-08-04 DIAGNOSIS — E119 Type 2 diabetes mellitus without complications: Secondary | ICD-10-CM

## 2018-08-04 DIAGNOSIS — I10 Essential (primary) hypertension: Secondary | ICD-10-CM

## 2018-08-04 MED ORDER — GLIMEPIRIDE 1 MG PO TABS: 0.5000 mg | ORAL_TABLET | Freq: Every day | ORAL | 2 refills | Status: DC

## 2018-08-04 MED ORDER — AMLODIPINE BESYLATE 10 MG PO TABS
10.0000 mg | ORAL_TABLET | Freq: Every day | ORAL | 1 refills | Status: DC
Start: 2018-08-04 — End: 2018-09-22

## 2018-08-04 NOTE — Telephone Encounter (Signed)
Prescriptions resent

## 2018-08-17 ENCOUNTER — Ambulatory Visit (INDEPENDENT_AMBULATORY_CARE_PROVIDER_SITE_OTHER): Payer: Medicare Other | Admitting: Physician Assistant

## 2018-08-17 ENCOUNTER — Encounter: Payer: Self-pay | Admitting: Physician Assistant

## 2018-08-17 ENCOUNTER — Other Ambulatory Visit: Payer: Self-pay

## 2018-08-17 VITALS — BP 149/68 | HR 77 | Temp 99.3°F | Resp 20 | Ht 59.65 in | Wt 106.2 lb

## 2018-08-17 DIAGNOSIS — E119 Type 2 diabetes mellitus without complications: Secondary | ICD-10-CM | POA: Diagnosis not present

## 2018-08-17 LAB — GLUCOSE, POCT (MANUAL RESULT ENTRY): POC Glucose: 169 mg/dl — AB (ref 70–99)

## 2018-08-17 NOTE — Patient Instructions (Addendum)
Take half a tablet of glimepiride daily to avoid low blood sugars.   Check blood glucose levels twice a day, fasting and 2 hours after largest meal and write these values down.  Please return in 2-3 weeks. Bring these readings to your next visit.  Signs of low blood sugar are lightheadedness, weakness, blurred vision, headache, and confusion.   Thank you for letting me participate in your health and well being.   L?y m?t n?a my tnh b?ng c?a glimepiride hng ngy ?? trnh cc lo?i ???ng trong mu th?p.   Ki?m tra m?c ?? glucose trong mu hai l?n m?t ngy, ?n chay v 2 gi? sau b?a ?n l?n nh?t v vi?t cc gi tr? ny xu?ng.  Vui lng quay l?i trong 2-3 tu?n. Mang theo nh?ng bi ??c ??n th?m ti?p theo c?a b?n.  D?u hi?u c?a l??ng ???ng trong mu th?p l nh?, y?u ?u?i, m? m?t, nh?c ??u, v s? nh?m l?n.   C?m ?n b?n ? cho ti tham gia vo s?c kh?e c?a b?n v c?ng ???c.   IF you received an x-ray today, you will receive an invoice from [facility]. Please contact [facility] at [phone] with questions or concerns regarding your invoice.   IF you received labwork today, you will receive an invoice from [facility]. Please contact [facility] at [phone] with questions or concerns regarding your invoice.   Our billing staff will not be able to assist you with questions regarding bills from these companies.  You will be contacted with the lab results as soon as they are available. The fastest way to get your results is to activate your My Chart account. Instructions are located on the last page of this paperwork. If you have not heard from us regarding the results in 2 weeks, please contact this office.

## 2018-08-17 NOTE — Progress Notes (Signed)
MRN: [id number]  Subjective:   Robin Andrews is a 84 y.o. female who presents for follow up of Type 2 diabetes mellitus.  Last saw Dr. Leretha PolSantiago on 08/02/2018.  At that time, was taking glimepiride 1 mg twice daily.  Was feeling very fatigued and tired in the mornings.  A1c was checked and it was 5.5.  Per review of Dr. Adela GlimpseSantiago's OV note, she was concerned about the risk of hypoglycemia with use of glimepiride.  Encouraged patient to decrease to half a tablet once daily.  Was also encouraged to start checking blood sugars at least twice daily and document these values and follow-up in 2 weeks for to have these reviewed.  Today, patient is accompanied by her husband.  Reports that she has been using half a tablet twice daily.  She does not want to go down to once daily because she feels like her sugars are high and that this is the best medication for her.  She checked her sugars about 3 times over the past 2 weeks.  Recordings have been 100, 101, and 200.  She does not remember when she took these in relation to food.  It is hard for her to check her sugars by herself because she does not like to stick her finger.  She has been on metformin in the past but does not know why it was discontinued.  She does not want to try any other medication right now.  Notes that she feels like, right worse the best for her and she is going to continue using it. Patient denies increased appetite, nausea, paresthesia of the feet, polydipsia, polyuria, visual disturbances, vomiting, weight loss, confusion, weakness, and dizziness.  Last ate about an hour and half before arrival.  No other questions or concerns today.  [employer] [place] start interpreter was used today: #[id number].  Patient Active Problem List   Diagnosis Date Noted  . Diabetes mellitus type 2, controlled, without complications (HCC) 12/18/2017  . Cardiac murmur 12/18/2017  . Left carotid bruit 12/18/2017  . Hyponatremia 06/01/2013  . Weakness  06/01/2013  . Cervical disc disease 05/25/2012  . Chronic pain syndrome 05/25/2012  . Osteoarthritis 05/25/2012  . Hyperlipidemia 05/30/2007  . HYPERTENSION, BENIGN ESSENTIAL 05/30/2007  . ALLERGIC RHINITIS 05/30/2007  . ASTEATOTIC ECZEMA 05/30/2007   Past Medical History:  Diagnosis Date  . Degenerative disc disease   . Diabetes mellitus without complication (HCC)   . Hypertension   . Pruritus    Social History   Socioeconomic History  . Marital status: Widowed    Spouse name: Not on file  . Number of children: 1  . Years of education: Not on file  . Highest education level: Not on file  Occupational History  . Not on file  Social Needs  . Financial resource strain: Not on file  . Food insecurity:    Worry: Not on file    Inability: Not on file  . Transportation needs:    Medical: Not on file    Non-medical: Not on file  Tobacco Use  . Smoking status: Never Smoker  . Smokeless tobacco: Never Used  Substance and Sexual Activity  . Alcohol use: No  . Drug use: No  . Sexual activity: Not Currently    Birth control/protection: Abstinence  Lifestyle  . Physical activity:    Days per week: Not on file    Minutes per session: Not on file  . Stress: Not on file  Relationships  . Social  connections:    Talks on phone: Not on file    Gets together: Not on file    Attends religious service: Not on file    Active member of club or organization: Not on file    Attends meetings of clubs or organizations: Not on file    Relationship status: Not on file  . Intimate partner violence:    Fear of current or ex partner: Not on file    Emotionally abused: Not on file    Physically abused: Not on file    Forced sexual activity: Not on file  Other Topics Concern  . Not on file  Social History Narrative  . Not on file       Objective:   PHYSICAL EXAM BP (!) 149/68   Pulse 77   Temp 99.3 F (37.4 C) (Oral)   Resp 20   Ht 4' 11.65" (1.515 m)   Wt 106 lb 3.2 oz  (48.2 kg)   SpO2 97%   BMI 20.99 kg/m   Physical Exam  Constitutional: She is oriented to person, place, and time. She appears well-developed and well-nourished.  HENT:  Head: Normocephalic and atraumatic.  Eyes: Conjunctivae are normal.  Neck: Normal range of motion.  Pulmonary/Chest: Effort normal.  Neurological: She is alert and oriented to person, place, and time.  Skin: Skin is warm and dry.  Psychiatric: She has a normal mood and affect. She is agitated.  Vitals reviewed.   BP Readings from Last 3 Encounters:  08/17/18 (!) 149/68  08/02/18 (!) 150/52  04/21/18 (!) 142/56    Results for orders placed or performed in visit on 08/17/18 (from the past 24 hour(s))  POCT glucose (manual entry)     Status: Abnormal   Collection Time: 08/17/18  9:26 AM  Result Value Ref Range   POC Glucose 169 (A) 70 - 99 mg/dl    Assessment and Plan :  1. Controlled type 2 diabetes mellitus without complication, without long-term current use of insulin (HCC) A1c on 08/02/2018 was 5.5.  Had a long discussion with patient and husband about the concern for potential hypoglycemia with use of glimepiride at her age of 39, especially with an A1c of 5.5.  Discussed possibility of discontinuing glimepiride completely and starting diabetes medication with less risk of hypoglycemia, such as metformin.  Patient then got very agitated and upset refusing to stop glimepiride and start another medication.  Voices that this is the medication that works the best for her and she wants to continue it.  She is scared of having high blood sugar readings and wants to take it twice a day.  Once again educated on potential for hypoglycemia and associated complications of hypoglycemia.  She refuses to change medication at this time. Considering she has refills of glimepiride at her pharmacy, do not suggest changing medication at this time as concern that she will pick up these refills and take medication anyway despite our  recommendations. Strongly encouraged her to decreased from taking half tablet BID to once daily. She agrees to do so. Will try to monitor sugars. Encouraged her to return in 2 weeks for reevaluation, advised to bring blood sugar log to that visit.  - POCT glucose (manual entry)  A total of 25 minutes as spent in the room with the patient, greater than 50% of which was in counseling/coordination of care regarding avoiding hypoglycemic events with dx of T2DM and use of glimepiride.    Benjiman Core, PA-C  Primary Care  at [facility] 08/17/2018 11:32 AM

## 2018-09-22 ENCOUNTER — Ambulatory Visit (INDEPENDENT_AMBULATORY_CARE_PROVIDER_SITE_OTHER): Payer: Medicare Other | Admitting: Family Medicine

## 2018-09-22 ENCOUNTER — Encounter: Payer: Self-pay | Admitting: Family Medicine

## 2018-09-22 ENCOUNTER — Other Ambulatory Visit: Payer: Self-pay

## 2018-09-22 VITALS — BP 130/67 | HR 67 | Ht 59.65 in | Wt 106.0 lb

## 2018-09-22 DIAGNOSIS — E119 Type 2 diabetes mellitus without complications: Secondary | ICD-10-CM | POA: Diagnosis not present

## 2018-09-22 DIAGNOSIS — I10 Essential (primary) hypertension: Secondary | ICD-10-CM

## 2018-09-22 LAB — GLUCOSE, POCT (MANUAL RESULT ENTRY): POC Glucose: 164 mg/dl — AB (ref 70–99)

## 2018-09-22 MED ORDER — HYDROCHLOROTHIAZIDE 25 MG PO TABS: 12.5000 mg | ORAL_TABLET | Freq: Every day | ORAL | 3 refills | Status: DC

## 2018-09-22 MED ORDER — OMEPRAZOLE 40 MG PO CPDR: 40.0000 mg | DELAYED_RELEASE_CAPSULE | Freq: Every day | ORAL | 3 refills | Status: DC

## 2018-09-22 MED ORDER — GLIMEPIRIDE 1 MG PO TABS: 0.5000 mg | ORAL_TABLET | Freq: Every day | ORAL | 3 refills | Status: DC

## 2018-09-22 NOTE — Patient Instructions (Addendum)
Please bring BP cuff and glucometer for nurse visit. Need to cehck home BP cuff and teach patient how to use glucometer     If you have lab work done today you will be contacted with your lab results within the next 2 weeks.  If you have not heard from [place] then please contact us. The fastest way to get your results is to register for My Chart.   IF you received an x-ray today, you will receive an invoice from [facility]. Please contact [facility] at [phone] with questions or concerns regarding your invoice.   IF you received labwork today, you will receive an invoice from [place]. Please contact [facility] at [phone] with questions or concerns regarding your invoice.   Our billing staff will not be able to assist you with questions regarding bills from these companies.  You will be contacted with the lab results as soon as they are available. The fastest way to get your results is to activate your My Chart account. Instructions are located on the last page of this paperwork. If you have not heard from [place] regarding the results in 2 weeks, please contact this office.

## 2018-09-22 NOTE — Progress Notes (Signed)
10/3/201910:10 AM  Robin Andrews [birth year], 84 y.o. female [id number]  Chief Complaint  Patient presents with  . Hypertension    husband says the bp was 190/** this morning  . Diabetes    does not know how to use the glucose meter at home to monitor bg's    HPI:   Patient is a 84 y.o. female with past medical history significant for DM2 and HTN who presents today for follow-up  Feeling very tired Home BP readings are very high, she gets shaky In the past took enalapril - caused itchiness Has been on amlodipine for years requesting to change as she is having issues with heartburn since the beginning of taking medication Diet is high in fruits and vegetables [facility]  Lab Results  Component Value Date   HGBA1C 5.4 08/02/2018   HGBA1C 5.5 12/18/2017   HGBA1C 5.8 (H) 07/02/2017   Lab Results  Component Value Date   [place] Comment 08/02/2018   CREATININE 0.74 08/02/2018   BP Readings from Last 3 Encounters:  09/22/18 130/67  08/17/18 (!) 149/68  08/02/18 (!) 150/52    Fall Risk  09/22/2018 08/17/2018 08/02/2018 04/21/2018 04/18/2018  Falls in the past year? No No No No No  Number falls in past yr: - - - - -  Injury with Fall? - - - - -     Depression screen [facility] 2/9 09/22/2018 08/17/2018 08/02/2018  Decreased Interest 0 0 0  Down, Depressed, Hopeless 0 0 0  PHQ - 2 Score 0 0 0    Allergies  Allergen Reactions  . Lisinopril     Prior to Admission medications   Medication Sig Start Date End Date Taking? Authorizing Provider  amLODipine (NORVASC) 10 MG tablet Take 1 tablet (10 mg total) by mouth daily. 08/04/18  Yes Rutherford Guys, MD  blood glucose meter kit and supplies KIT Dispense on insurance preference. Check cbg every morning before breakfast and when feeling tired.. DX E11.9, O67.124 08/02/18  Yes Rutherford Guys, MD  glimepiride (AMARYL) 1 MG tablet Take 0.5 tablets (0.5 mg total) by mouth daily with breakfast. 08/04/18  Yes  Rutherford Guys, MD    Past Medical History:  Diagnosis Date  . Degenerative disc disease   . Diabetes mellitus without complication ([place])   . Hypertension   . Pruritus     Past Surgical History:  Procedure Laterality Date  . DILATION AND CURETTAGE, DIAGNOSTIC / THERAPEUTIC    . NECK SURGERY      Social History   Tobacco Use  . Smoking status: Never Smoker  . Smokeless tobacco: Never Used  Substance Use Topics  . Alcohol use: No    Family History  Family history unknown: Yes    Review of Systems  Constitutional: Negative for chills and fever.  Respiratory: Negative for cough and shortness of breath.   Cardiovascular: Negative for chest pain, palpitations and leg swelling.  Gastrointestinal: Negative for abdominal pain, nausea and vomiting.     OBJECTIVE:  Blood pressure 130/67, pulse 67, height 4' 11.65" (1.515 m), weight 106 lb (48.1 kg), SpO2 98 %. Body mass index is 20.95 kg/m.   Physical Exam  Constitutional: She is oriented to person, place, and time. She appears well-developed and well-nourished.  HENT:  Head: Normocephalic and atraumatic.  Mouth/Throat: Mucous membranes are normal.  Eyes: Pupils are equal, round, and reactive to light. Conjunctivae and EOM are normal. No scleral icterus.  Neck:  Neck supple.  Pulmonary/Chest: Effort normal.  Neurological: She is alert and oriented to person, place, and time.  Skin: Skin is warm and dry.  Psychiatric: Her mood appears anxious. Her affect is angry.  Nursing note and vitals reviewed.   Results for orders placed or performed in visit on 09/22/18 (from the past 24 hour(s))  POCT glucose (manual entry)     Status: Abnormal   Collection Time: 09/22/18  9:17 AM  Result Value Ref Range   POC Glucose 164 (A) 70 - 99 mg/dl      ASSESSMENT and PLAN  1. Controlled type 2 diabetes mellitus without complication, without long-term current use of insulin (HCC) Controlled. Continue current regime. Bring  glucometer to nurse visit so that we can teach patient how to use - POCT glucose (manual entry) - glimepiride (AMARYL) 1 MG tablet; Take 0.5 tablets (0.5 mg total) by mouth daily with breakfast.  2. HYPERTENSION, BENIGN ESSENTIAL Controlled but having side effects to medication. Changing amlodipine to hctz, discussed new med r/se/b. pffered PPI in case sx persist with stopping amlodipine  Interpreter used for this visit  Other orders - omeprazole (PRILOSEC) 40 MG capsule; Take 1 capsule (40 mg total) by mouth daily. - hydrochlorothiazide (HYDRODIURIL) 25 MG tablet; Take 0.5 tablets (12.5 mg total) by mouth daily.  Return in about 3 months (around 12/23/2018) for chronic medical conditions.    Rutherford Guys, MD Primary Care at [place] [place], [place] [address] Ph.  [phone] Fax [phone]

## 2018-09-26 ENCOUNTER — Ambulatory Visit (INDEPENDENT_AMBULATORY_CARE_PROVIDER_SITE_OTHER): Payer: Medicare Other

## 2018-09-26 ENCOUNTER — Ambulatory Visit (HOSPITAL_COMMUNITY)
Admission: EM | Admit: 2018-09-26 | Discharge: 2018-09-26 | Disposition: A | Discharge status: Home / Self Care | Payer: Medicare Other | Attending: Family Medicine | Admitting: Family Medicine

## 2018-09-26 ENCOUNTER — Encounter (HOSPITAL_COMMUNITY): Payer: Self-pay | Admitting: Emergency Medicine

## 2018-09-26 DIAGNOSIS — J40 Bronchitis, not specified as acute or chronic: Secondary | ICD-10-CM

## 2018-09-26 DIAGNOSIS — R05 Cough: Secondary | ICD-10-CM | POA: Diagnosis not present

## 2018-09-26 MED ORDER — BENZONATATE 100 MG PO CAPS: 100.0000 mg | ORAL_CAPSULE | Freq: Three times a day (TID) | ORAL | 0 refills | Status: DC

## 2018-09-26 MED ORDER — AZITHROMYCIN 250 MG PO TABS: ORAL_TABLET | ORAL | 0 refills | Status: AC

## 2018-09-26 MED ORDER — ALBUTEROL SULFATE HFA 108 (90 BASE) MCG/ACT IN AERS: 2.0000 | INHALATION_SPRAY | Freq: Four times a day (QID) | RESPIRATORY_TRACT | 2 refills | Status: DC | PRN

## 2018-09-26 NOTE — ED Triage Notes (Addendum)
Pt states she has a dry cough. X 2 months pt would like to get her meds refilled.

## 2018-09-26 NOTE — Discharge Instructions (Signed)
Complete course of antibiotics.  Tessalon as needed for cough.  Inhaler as needed for wheezing or shortness of breath .  Your medications were refilled by your primary care provider 4 days ago and should be at the pharmacy.  If worsening of symptoms, develop fever, increased shortness of breath , or otherwise worsening please return or see your primary care provider.

## 2018-09-26 NOTE — ED Provider Notes (Signed)
[facility]    CSN: [id number] Arrival date & time: 09/26/18  1557     History   Chief Complaint Chief Complaint  Patient presents with  . Cough    HPI Robin Andrews is a 84 y.o. female.   Venera presents with her son with complaints of cough for the past month which has worsened. Having a difficult time sleeping due to cough. Worse at night. No fevers. Feels fatigued. No gi/gu complaints. No specific shortness of breath.  Also states ran out of amaryl for diabetes management. Was at PCP 10/3, however. Son states she had similar illness earlier this year, per chart review with bronchitis 4/29. No sore throat or ear pain. Hx of DDD, DM, htn, cardiac murmur.   Son provides interpretation per patient request.   ROS per HPI.      Past Medical History:  Diagnosis Date  . Degenerative disc disease   . Diabetes mellitus without complication ([facility])   . Hypertension   . Pruritus     Patient Active Problem List   Diagnosis Date Noted  . Diabetes mellitus type 2, controlled, without complications ([facility]) 69/48/5462  . Cardiac murmur 12/18/2017  . Left carotid bruit 12/18/2017  . Hyponatremia 06/01/2013  . Weakness 06/01/2013  . Cervical disc disease 05/25/2012  . Chronic pain syndrome 05/25/2012  . Osteoarthritis 05/25/2012  . Hyperlipidemia 05/30/2007  . HYPERTENSION, BENIGN ESSENTIAL 05/30/2007  . ALLERGIC RHINITIS 05/30/2007  . ASTEATOTIC ECZEMA 05/30/2007    Past Surgical History:  Procedure Laterality Date  . DILATION AND CURETTAGE, DIAGNOSTIC / THERAPEUTIC    . NECK SURGERY      OB History   None      Home Medications    Prior to Admission medications   Medication Sig Start Date End Date Taking? Authorizing Provider  albuterol (PROVENTIL HFA;VENTOLIN HFA) 108 (90 Base) MCG/ACT inhaler Inhale 2 puffs into the lungs every 6 (six) hours as needed for wheezing or shortness of breath. 09/26/18   Zigmund Gottron, NP  azithromycin (ZITHROMAX) 250 MG  tablet Take 2 tablets (500 mg total) by mouth daily for 1 day, THEN 1 tablet (250 mg total) daily for 4 days. 09/26/18 10/01/18  Zigmund Gottron, NP  benzonatate (TESSALON) 100 MG capsule Take 1 capsule (100 mg total) by mouth every 8 (eight) hours. 09/26/18   Zigmund Gottron, NP  blood glucose meter kit and supplies KIT Dispense on insurance preference. Check cbg every morning before breakfast and when feeling tired.. DX E11.9, E11.649 08/02/18   Rutherford Guys, MD  glimepiride (AMARYL) 1 MG tablet Take 0.5 tablets (0.5 mg total) by mouth daily with breakfast. 09/22/18   Rutherford Guys, MD  hydrochlorothiazide (HYDRODIURIL) 25 MG tablet Take 0.5 tablets (12.5 mg total) by mouth daily. 09/22/18   Rutherford Guys, MD  omeprazole (PRILOSEC) 40 MG capsule Take 1 capsule (40 mg total) by mouth daily. 09/22/18   Rutherford Guys, MD    Family History Family History  Family history unknown: Yes    Social History Social History   Tobacco Use  . Smoking status: Never Smoker  . Smokeless tobacco: Never Used  Substance Use Topics  . Alcohol use: No  . Drug use: No     Allergies   Lisinopril   Review of Systems Review of Systems   Physical Exam Triage Vital Signs ED Triage Vitals  Enc Vitals Group     BP 09/26/18 1646 (!) 156/65  Pulse Rate 09/26/18 1646 74     Resp 09/26/18 1646 18     Temp 09/26/18 1646 97.9 F (36.6 C)     Temp src --      SpO2 09/26/18 1646 99 %     Weight 09/26/18 1650 108 lb (49 kg)     Height --      Head Circumference --      Peak Flow --      Pain Score --      Pain Loc --      Pain Edu? --      Excl. in [place]? --    No data found.  Updated Vital Signs BP (!) 156/65 (BP Location: Right Arm)   Pulse 74   Temp 97.9 F (36.6 C)   Resp 18   Wt 108 lb (49 kg)   SpO2 99%   BMI 21.34 kg/m   Visual Acuity Right Eye Distance:   Left Eye Distance:   Bilateral Distance:    Right Eye Near:   Left Eye Near:    Bilateral Near:     Physical  Exam  Constitutional: She is oriented to person, place, and time. She appears well-developed and well-nourished. No distress.  HENT:  Head: Normocephalic and atraumatic.  Right Ear: Tympanic membrane, external ear and ear canal normal.  Left Ear: Tympanic membrane, external ear and ear canal normal.  Nose: Nose normal.  Mouth/Throat: Uvula is midline, oropharynx is clear and moist and mucous membranes are normal. No tonsillar exudate.  Eyes: Pupils are equal, round, and reactive to light. Conjunctivae and EOM are normal.  Cardiovascular: Normal rate, regular rhythm and normal heart sounds.  Pulmonary/Chest: Effort normal. Tachypnea noted. She has rales.  Scattered crackles throughout noted   Neurological: She is alert and oriented to person, place, and time.  Skin: Skin is warm and dry.     UC Treatments / Results  Labs (all labs ordered are listed, but only abnormal results are displayed) Labs Reviewed - No data to display  EKG None  Radiology Dg Chest 2 View  Result Date: 09/26/2018 CLINICAL DATA:  Sick for over a month, dry cough, shortness of breath, crackles, history hypertension, diabetes mellitus EXAM: CHEST - 2 VIEW COMPARISON:  04/21/2018 FINDINGS: Enlargement of cardiac silhouette. Atherosclerotic calcification and mild elongation of thoracic aorta. Mediastinal contours and pulmonary vascularity normal. Lungs clear. Chronic central peribronchial thickening. No pleural effusion or pneumothorax. Bones demineralized with evidence of prior cervical spine fusion and thoracolumbar scoliosis. IMPRESSION: Enlargement of cardiac silhouette. Chronic bronchitic changes without infiltrate. Electronically Signed   By: Lavonia Dana M.D.   On: 09/26/2018 18:18    Procedures Procedures (including critical care time)  Medications Ordered in UC Medications - No data to display  Initial Impression / Assessment and Plan / UC Course  I have reviewed the triage vital signs and the nursing  notes.  Pertinent labs & imaging results that were available during my care of the patient were reviewed by me and considered in my medical decision making (see chart for details).     All medications refilled 10/3, son notified of this. Patient with crackles, dry cough noted and mild tachypnea. Chronic changes on chest xray. Will provide empiric treatment with azithromycin, as well as inhaler and tessalon prn. Return precautions provided. If symptoms worsen or do not improve in the next week to return to be seen or to follow up with PCP.  Patient and son verbalized understanding and agreeable to  plan.   Final Clinical Impressions(s) / UC Diagnoses   Final diagnoses:  Bronchitis     Discharge Instructions     Complete course of antibiotics.  Tessalon as needed for cough.  Inhaler as needed for wheezing or shortness of breath .  Your medications were refilled by your primary care provider 4 days ago and should be at the pharmacy.  If worsening of symptoms, develop fever, increased shortness of breath , or otherwise worsening please return or see your primary care provider.     ED Prescriptions    Medication Sig Dispense Auth. Provider   azithromycin (ZITHROMAX) 250 MG tablet Take 2 tablets (500 mg total) by mouth daily for 1 day, THEN 1 tablet (250 mg total) daily for 4 days. 6 tablet Augusto Gamble B, NP   albuterol (PROVENTIL HFA;VENTOLIN HFA) 108 (90 Base) MCG/ACT inhaler Inhale 2 puffs into the lungs every 6 (six) hours as needed for wheezing or shortness of breath. 1 Inhaler Kendalynn Wideman B, NP   benzonatate (TESSALON) 100 MG capsule Take 1 capsule (100 mg total) by mouth every 8 (eight) hours. 21 capsule Zigmund Gottron, NP     Controlled Substance Prescriptions [place] Controlled Substance Registry consulted? Not Applicable   Zigmund Gottron, NP 09/26/18 1824

## 2018-10-04 ENCOUNTER — Inpatient Hospital Stay (HOSPITAL_COMMUNITY)
Admission: EM | Admit: 2018-10-04 | Discharge: 2018-10-06 | DRG: 641 | Disposition: A | Discharge status: Home / Self Care | Payer: Medicare Other | Attending: Internal Medicine | Admitting: Internal Medicine

## 2018-10-04 ENCOUNTER — Encounter (HOSPITAL_COMMUNITY): Payer: Self-pay

## 2018-10-04 ENCOUNTER — Other Ambulatory Visit: Payer: Self-pay

## 2018-10-04 ENCOUNTER — Emergency Department (HOSPITAL_COMMUNITY): Payer: Medicare Other

## 2018-10-04 ENCOUNTER — Ambulatory Visit (INDEPENDENT_AMBULATORY_CARE_PROVIDER_SITE_OTHER)
Admission: EM | Admit: 2018-10-04 | Discharge: 2018-10-04 | Disposition: A | Discharge status: Home / Self Care | Payer: Medicare Other | Source: Home / Self Care | Attending: Family Medicine | Admitting: Family Medicine

## 2018-10-04 ENCOUNTER — Encounter (HOSPITAL_COMMUNITY): Payer: Self-pay | Admitting: Emergency Medicine

## 2018-10-04 DIAGNOSIS — Z7951 Long term (current) use of inhaled steroids: Secondary | ICD-10-CM | POA: Diagnosis not present

## 2018-10-04 DIAGNOSIS — K5900 Constipation, unspecified: Secondary | ICD-10-CM

## 2018-10-04 DIAGNOSIS — E11649 Type 2 diabetes mellitus with hypoglycemia without coma: Secondary | ICD-10-CM | POA: Diagnosis not present

## 2018-10-04 DIAGNOSIS — T502X5A Adverse effect of carbonic-anhydrase inhibitors, benzothiadiazides and other diuretics, initial encounter: Secondary | ICD-10-CM | POA: Diagnosis not present

## 2018-10-04 DIAGNOSIS — N3289 Other specified disorders of bladder: Secondary | ICD-10-CM | POA: Diagnosis not present

## 2018-10-04 DIAGNOSIS — Z79899 Other long term (current) drug therapy: Secondary | ICD-10-CM | POA: Diagnosis not present

## 2018-10-04 DIAGNOSIS — Z7984 Long term (current) use of oral hypoglycemic drugs: Secondary | ICD-10-CM | POA: Diagnosis not present

## 2018-10-04 DIAGNOSIS — Z23 Encounter for immunization: Secondary | ICD-10-CM

## 2018-10-04 DIAGNOSIS — E876 Hypokalemia: Secondary | ICD-10-CM | POA: Diagnosis not present

## 2018-10-04 DIAGNOSIS — I10 Essential (primary) hypertension: Secondary | ICD-10-CM | POA: Diagnosis present

## 2018-10-04 DIAGNOSIS — E871 Hypo-osmolality and hyponatremia: Principal | ICD-10-CM | POA: Diagnosis present

## 2018-10-04 DIAGNOSIS — E861 Hypovolemia: Secondary | ICD-10-CM | POA: Diagnosis not present

## 2018-10-04 DIAGNOSIS — E1165 Type 2 diabetes mellitus with hyperglycemia: Secondary | ICD-10-CM | POA: Diagnosis not present

## 2018-10-04 DIAGNOSIS — R531 Weakness: Secondary | ICD-10-CM

## 2018-10-04 DIAGNOSIS — E119 Type 2 diabetes mellitus without complications: Secondary | ICD-10-CM | POA: Diagnosis not present

## 2018-10-04 DIAGNOSIS — Z888 Allergy status to other drugs, medicaments and biological substances status: Secondary | ICD-10-CM

## 2018-10-04 LAB — CBC WITH DIFFERENTIAL/PLATELET
Abs Immature Granulocytes: 0.07 10*3/uL (ref 0.00–0.07)
BASOS ABS: 0.1 10*3/uL (ref 0.0–0.1)
Basophils Relative: 1 %
EOS ABS: 0.3 10*3/uL (ref 0.0–0.5)
Eosinophils Relative: 3 %
HEMATOCRIT: 35.7 % — AB (ref 36.0–46.0)
Hemoglobin: 12.7 g/dL (ref 12.0–15.0)
Immature Granulocytes: 1 %
LYMPHS ABS: 1.3 10*3/uL (ref 0.7–4.0)
Lymphocytes Relative: 10 %
MCH: 28.9 pg (ref 26.0–34.0)
MCHC: 35.6 g/dL (ref 30.0–36.0)
MCV: 81.1 fL (ref 80.0–100.0)
Monocytes Absolute: 1.1 10*3/uL — ABNORMAL HIGH (ref 0.1–1.0)
Monocytes Relative: 9 %
Neutro Abs: 9.9 10*3/uL — ABNORMAL HIGH (ref 1.7–7.7)
Neutrophils Relative %: 76 %
PLATELETS: 352 10*3/uL (ref 150–400)
RBC: 4.4 MIL/uL (ref 3.87–5.11)
RDW: 10.8 % — AB (ref 11.5–15.5)
WBC: 12.9 10*3/uL — ABNORMAL HIGH (ref 4.0–10.5)
nRBC: 0 % (ref 0.0–0.2)

## 2018-10-04 LAB — BASIC METABOLIC PANEL
ANION GAP: 16 — AB (ref 5–15)
ANION GAP: 18 — AB (ref 5–15)
ANION GAP: 13 (ref 5–15)
BUN: 14 mg/dL (ref 8–23)
BUN: 13 mg/dL (ref 8–23)
BUN: 16 mg/dL (ref 8–23)
CALCIUM: 8.9 mg/dL (ref 8.9–10.3)
CALCIUM: 9.3 mg/dL (ref 8.9–10.3)
CO2: 28 mmol/L (ref 22–32)
CO2: 30 mmol/L (ref 22–32)
CO2: 31 mmol/L (ref 22–32)
Calcium: 8.9 mg/dL (ref 8.9–10.3)
Chloride: 74 mmol/L — ABNORMAL LOW (ref 98–111)
Chloride: 71 mmol/L — ABNORMAL LOW (ref 98–111)
Chloride: 75 mmol/L — ABNORMAL LOW (ref 98–111)
Creatinine, Ser: 0.8 mg/dL (ref 0.44–1.00)
Creatinine, Ser: 0.76 mg/dL (ref 0.44–1.00)
Creatinine, Ser: 0.98 mg/dL (ref 0.44–1.00)
GFR calc Af Amer: >60 mL/min (ref >60)
GFR calc Af Amer: >60 mL/min (ref >60)
GFR calc Af Amer: 59 mL/min — ABNORMAL LOW (ref >60)
GFR calc non Af Amer: >60 mL/min (ref >60)
GFR calc non Af Amer: >60 mL/min (ref >60)
GFR calc non Af Amer: 51 mL/min — ABNORMAL LOW (ref >60)
GLUCOSE: 84 mg/dL (ref 70–99)
GLUCOSE: 240 mg/dL — AB (ref 70–99)
Glucose, Bld: 88 mg/dL (ref 70–99)
POTASSIUM: 2.8 mmol/L — AB (ref 3.5–5.1)
POTASSIUM: 4.2 mmol/L (ref 3.5–5.1)
Potassium: 2.6 mmol/L — CL (ref 3.5–5.1)
Sodium: 118 mmol/L — CL (ref 135–145)
Sodium: 119 mmol/L — CL (ref 135–145)
Sodium: 119 mmol/L — CL (ref 135–145)

## 2018-10-04 LAB — LIPID PANEL
CHOL/HDL RATIO: 3.4 ratio
Cholesterol: 178 mg/dL (ref 0–200)
HDL: 53 mg/dL (ref >40)
LDL CALC: 75 mg/dL (ref 0–99)
Triglycerides: 252 mg/dL — ABNORMAL HIGH (ref <150)
VLDL: 50 mg/dL — ABNORMAL HIGH (ref 0–40)

## 2018-10-04 LAB — COMPREHENSIVE METABOLIC PANEL
ALK PHOS: 76 U/L (ref 38–126)
ALT: 19 U/L (ref 0–44)
ANION GAP: 14 (ref 5–15)
AST: 30 U/L (ref 15–41)
Albumin: 3.9 g/dL (ref 3.5–5.0)
BUN: 16 mg/dL (ref 8–23)
CALCIUM: 9.3 mg/dL (ref 8.9–10.3)
CO2: 30 mmol/L (ref 22–32)
Chloride: 73 mmol/L — ABNORMAL LOW (ref 98–111)
Creatinine, Ser: 0.88 mg/dL (ref 0.44–1.00)
GFR calc Af Amer: >60 mL/min (ref >60)
GFR calc non Af Amer: 58 mL/min — ABNORMAL LOW (ref >60)
Glucose, Bld: 173 mg/dL — ABNORMAL HIGH (ref 70–99)
Potassium: 2.7 mmol/L — CL (ref 3.5–5.1)
SODIUM: 117 mmol/L — AB (ref 135–145)
Total Bilirubin: 0.6 mg/dL (ref 0.3–1.2)
Total Protein: 7.2 g/dL (ref 6.5–8.1)

## 2018-10-04 LAB — SODIUM, URINE, RANDOM: Sodium, Ur: 34 mmol/L

## 2018-10-04 LAB — GLUCOSE, CAPILLARY
GLUCOSE-CAPILLARY: 285 mg/dL — AB (ref 70–99)
Glucose-Capillary: 205 mg/dL — ABNORMAL HIGH (ref 70–99)

## 2018-10-04 LAB — POCT I-STAT, CHEM 8
BUN: 17 mg/dL (ref 8–23)
CALCIUM ION: 1.03 mmol/L — AB (ref 1.15–1.40)
Chloride: 71 mmol/L — ABNORMAL LOW (ref 98–111)
Creatinine, Ser: 0.9 mg/dL (ref 0.44–1.00)
GLUCOSE: 252 mg/dL — AB (ref 70–99)
HCT: 40 % (ref 36.0–46.0)
HEMOGLOBIN: 13.6 g/dL (ref 12.0–15.0)
Potassium: 3.4 mmol/L — ABNORMAL LOW (ref 3.5–5.1)
SODIUM: 113 mmol/L — AB (ref 135–145)
TCO2: 31 mmol/L (ref 22–32)

## 2018-10-04 LAB — OSMOLALITY, URINE: OSMOLALITY UR: 163 mosm/kg — AB (ref 300–900)

## 2018-10-04 LAB — URINALYSIS, ROUTINE W REFLEX MICROSCOPIC
BILIRUBIN URINE: NEGATIVE
Bacteria, UA: NONE SEEN
Glucose, UA: NEGATIVE mg/dL
KETONES UR: NEGATIVE mg/dL
Leukocytes, UA: NEGATIVE
NITRITE: NEGATIVE
Protein, ur: NEGATIVE mg/dL
SPECIFIC GRAVITY, URINE: 1.013 (ref 1.005–1.030)
pH: 8 (ref 5.0–8.0)

## 2018-10-04 LAB — MAGNESIUM: MAGNESIUM: 2.1 mg/dL (ref 1.7–2.4)

## 2018-10-04 LAB — LIPASE, BLOOD: LIPASE: 108 U/L — AB (ref 11–51)

## 2018-10-04 LAB — TSH: TSH: 0.693 u[IU]/mL (ref 0.350–4.500)

## 2018-10-04 LAB — OSMOLALITY: Osmolality: 254 mOsm/kg — ABNORMAL LOW (ref 275–295)

## 2018-10-04 LAB — MRSA PCR SCREENING: MRSA BY PCR: NEGATIVE

## 2018-10-04 MED ORDER — SENNOSIDES-DOCUSATE SODIUM 8.6-50 MG PO TABS
1.0000 | ORAL_TABLET | Freq: Two times a day (BID) | ORAL | Status: DC
  Administered 2018-10-04 – 2018-10-06 (×5): 1 via ORAL
  Filled 2018-10-04 (×6): qty 1

## 2018-10-04 MED ORDER — POTASSIUM CITRATE ER 10 MEQ (1080 MG) PO TBCR
40.0000 meq | EXTENDED_RELEASE_TABLET | Freq: Two times a day (BID) | ORAL | Status: DC
  Administered 2018-10-04: 40 meq via ORAL
  Filled 2018-10-04 (×2): qty 4

## 2018-10-04 MED ORDER — ONDANSETRON HCL 4 MG PO TABS: 4.0000 mg | ORAL_TABLET | Freq: Four times a day (QID) | ORAL | Status: DC | PRN

## 2018-10-04 MED ORDER — FLEET ENEMA 7-19 GM/118ML RE ENEM
1.0000 | ENEMA | Freq: Once | RECTAL | Status: AC
  Administered 2018-10-04: 1 via RECTAL
  Filled 2018-10-04: qty 1

## 2018-10-04 MED ORDER — ONDANSETRON HCL 4 MG/2ML IJ SOLN: 4.0000 mg | Freq: Four times a day (QID) | INTRAMUSCULAR | Status: DC | PRN

## 2018-10-04 MED ORDER — INSULIN ASPART 100 UNIT/ML ~~LOC~~ SOLN
0.0000 [IU] | SUBCUTANEOUS | Status: DC
  Administered 2018-10-04: 3 [IU] via SUBCUTANEOUS
  Administered 2018-10-04: 5 [IU] via SUBCUTANEOUS
  Administered 2018-10-05 (×2): 1 [IU] via SUBCUTANEOUS
  Administered 2018-10-05: 2 [IU] via SUBCUTANEOUS

## 2018-10-04 MED ORDER — POTASSIUM CITRATE ER 10 MEQ (1080 MG) PO TBCR
80.0000 meq | EXTENDED_RELEASE_TABLET | Freq: Once | ORAL | Status: AC
  Administered 2018-10-04: 80 meq via ORAL
  Filled 2018-10-04: qty 8

## 2018-10-04 MED ORDER — POTASSIUM CHLORIDE 10 MEQ/100ML IV SOLN: 10.0000 meq | INTRAVENOUS | Status: DC

## 2018-10-04 MED ORDER — POLYETHYLENE GLYCOL 3350 17 G PO PACK: 17.0000 g | PACK | Freq: Every day | ORAL | Status: DC

## 2018-10-04 MED ORDER — ACETAMINOPHEN 650 MG RE SUPP: 650.0000 mg | Freq: Four times a day (QID) | RECTAL | Status: DC | PRN

## 2018-10-04 MED ORDER — PANTOPRAZOLE SODIUM 40 MG PO TBEC
40.0000 mg | DELAYED_RELEASE_TABLET | Freq: Every day | ORAL | Status: DC
  Administered 2018-10-04 – 2018-10-06 (×3): 40 mg via ORAL
  Filled 2018-10-04 (×4): qty 1

## 2018-10-04 MED ORDER — ENOXAPARIN SODIUM 40 MG/0.4ML ~~LOC~~ SOLN
40.0000 mg | SUBCUTANEOUS | Status: DC
  Administered 2018-10-04: 40 mg via SUBCUTANEOUS
  Filled 2018-10-04: qty 0.4

## 2018-10-04 MED ORDER — POTASSIUM CITRATE-CITRIC ACID 1100-334 MG/5ML PO SOLN: 40.0000 meq | Freq: Two times a day (BID) | ORAL | Status: DC

## 2018-10-04 MED ORDER — IOHEXOL 300 MG/ML  SOLN
75.0000 mL | Freq: Once | INTRAMUSCULAR | Status: AC
  Administered 2018-10-04: 75 mL via INTRAVENOUS

## 2018-10-04 MED ORDER — ENSURE ENLIVE PO LIQD
237.0000 mL | Freq: Two times a day (BID) | ORAL | Status: DC
  Administered 2018-10-05 (×2): 237 mL via ORAL

## 2018-10-04 MED ORDER — ACETAMINOPHEN 325 MG PO TABS: 650.0000 mg | ORAL_TABLET | Freq: Four times a day (QID) | ORAL | Status: DC | PRN

## 2018-10-04 MED ORDER — PNEUMOCOCCAL VAC POLYVALENT 25 MCG/0.5ML IJ INJ
0.5000 mL | INJECTION | INTRAMUSCULAR | Status: AC
  Administered 2018-10-05: 0.5 mL via INTRAMUSCULAR
  Filled 2018-10-04: qty 0.5

## 2018-10-04 MED ORDER — ALBUTEROL SULFATE (2.5 MG/3ML) 0.083% IN NEBU: 2.5000 mg | INHALATION_SOLUTION | Freq: Four times a day (QID) | RESPIRATORY_TRACT | Status: DC | PRN

## 2018-10-04 NOTE — H&P (Addendum)
Date: 10/04/2018               Patient Name:  Robin Andrews MRN: [id number]  DOB: 1931-05-12 Age / Sex: 84 y.o., female   PCP: Myles Lipps, MD         Medical Service: Internal Medicine Teaching Service         Attending Physician: Dr. Gust Rung, DO    First Contact: Dr. Gwyneth Revels Pager: [phone]  Second Contact: Dr. Caron Presume Pager: [phone]       After Hours (After 5p/  First Contact Pager: [phone]  weekends / holidays): Second Contact Pager: [phone]   Chief Complaint: Hyponatremia  History of Present Illness: This is an 84 year old [place] speaking female.  Interpreter was used.  She has a past medical history of degenerative disc disease, diabetes type 2, and hypertension who presented due to constipation for 7 days, nausea, abdominal pain and feeling tired.  Reports that it is because she had high blood pressure.  She had been seen at the urgent care earlier this week and had a chemistry panel done that showed severe hyponatremia and hypokalemia.  She reports that she started taking HCTZ about 2 weeks ago and all of the symptoms occurred.  We spoke to her grandson on the phone who reported that she has been sick for a while.  She has been having a lot of coughing episodes of sweating and went to the doctor last week, per chart review seems like she was given empiric treatment with azithromycin.  She denied any lightheadedness, dizziness, headaches, pain, or any issues with urination.  She does report that she drinks about 4 bottles of water per day and her diet says 2 meals a day but she feels like she is eating more recently which she attributes to the hypertension medication.  She repeatedly reports that she just feels like her bottom is heavy and she feels like she needs to go to the bathroom but she can't go. Per chart review it does not seem like she has had any episodes of hyponatremia before.  In the ED her vitals were stable for some mild hypertension up to 145/65.   CMP showed severe hyponatremia to 117, hypokalemia to 2.7, CL 73, CO2 30, glucose 173, BUN 16, Cr 0.88.  CBC showed a WBC 12.9, hemoglobin 12.7, platelet 352.  CT abdomen pelvis showed no acute finding, history of constipation but no excessive stool retention or rectal impaction, and aortic atherosclerosis.  She was given potassium citrate 40 mEq.   Meds:  Current Meds  Medication Sig  . albuterol (PROVENTIL HFA;VENTOLIN HFA) 108 (90 Base) MCG/ACT inhaler Inhale 2 puffs into the lungs every 6 (six) hours as needed for wheezing or shortness of breath.  Marland Kitchen glimepiride (AMARYL) 1 MG tablet Take 0.5 tablets (0.5 mg total) by mouth daily with breakfast.  . omeprazole (PRILOSEC) 40 MG capsule Take 1 capsule (40 mg total) by mouth daily.     Allergies: Allergies as of 10/04/2018 - Review Complete 10/04/2018  Allergen Reaction Noted  . Lisinopril  06/01/2013   Past Medical History:  Diagnosis Date  . Degenerative disc disease   . Diabetes mellitus without complication ([facility])   . Hypertension   . Pruritus     Family History: She denies any significant family history.  Denies any kidney disorders the family.  Social History: Per chart review, no smoking, alcohol use, or other drug use.  Appears as though she lives with her  Review of Systems: A complete ROS was negative except as per HPI. Review of Systems  Constitutional: Negative for chills and fever.  Eyes: Negative for pain.  Respiratory: Negative for cough.   Cardiovascular: Negative for chest pain and leg swelling.  Gastrointestinal: Positive for abdominal pain and constipation. Negative for diarrhea.  Genitourinary: Negative for dysuria, frequency and urgency.  Musculoskeletal: Negative for back pain, joint pain and neck pain.  Neurological: Negative for dizziness, tingling, seizures and headaches.  Endo/Heme/Allergies: Negative for polydipsia.  Psychiatric/Behavioral: Negative.      Physical Exam: Blood pressure (!) 137/55,  pulse 69, temperature 98.1 F (36.7 C), temperature source Oral, resp. rate (!) 24, SpO2 100 %. Physical Exam  Constitutional: She is well-developed, well-nourished, and in no distress.  HENT:  Head: Normocephalic and atraumatic.  Eyes: Pupils are equal, round, and reactive to light. Conjunctivae are normal.  Neck: Normal range of motion. Neck supple.  Cardiovascular: Normal rate, regular rhythm and normal heart sounds. Exam reveals no gallop and no friction rub.  No murmur heard. Pulmonary/Chest: Effort normal and breath sounds normal. No respiratory distress. She has no wheezes.  Abdominal: Bowel sounds are normal. She exhibits distension. She exhibits no mass. There is tenderness (diffuse). There is no rebound.  Musculoskeletal: Normal range of motion.  Neurological: She is alert.  Skin: Skin is warm and dry. She is not diaphoretic. No erythema.  No jaundice.  Psychiatric: Mood and affect normal.   CT ABDOMEN AND PELVIS WITH CONTRAST  IMPRESSION: 1. No acute finding. 2. History of constipation. No excessive stool retention or rectal impaction. 3. Aortic Atherosclerosis (ICD10-I70.0).  Assessment & Plan by Problem: Active Problems:   Hyponatremia This is an 84 year old female with history of hypertension and diabetes who presented due to things of hyponatremia found to the urgent care clinic.  It is down to 111, 113, and 117 here.  Her only symptoms are nausea and constipation.  Admitted for treatment of hyponatremia.   Hypotonic hyponatremia: She was found to have a sodium of 111-117. Patient reports that she just recently started taking the HCTZ recently, per chart review looks like it was started on 10/19/2018, she had been on amlodipine but had been having issues with heartburn she was switched to HCTZ.  She does have some hyperglycemia, 173, but corrected sodium is still low either 118-119.  Kidney function was normal.  He does report drinking her bottles of water a day and  eating about 2 meals a day.  Does not seem like she does not have a "tea and toast" sort of diet and she reports an increased appetite recently.  This may be a mix of thiazide-induced hyponatremia and hypovolemia due to decreased fluid intake 2/2 her recent illness.  She does not seem to significant signs of hypovolemia such as low blood pressure, feelings of lightheadedness or dizziness or decreased perfusion on exam. If she does not improve we will search for other causes of hyponatremia with urine studies and serum studies. We will hold diuretic for now.  She does meet the criteria for severe hyponatremia however she was alert and interactive and showed no signs of severe hyponatremia symptoms.  We will hold off on hypertonic saline for now. -Serum osmolality on BMP was 249 -Goal for Na in 24 hours will be 121,  -Hold hydrochlorothiazide -Measure serum osmolality and sodium -Measure urine sodium and osmolality  -TSH  Hypokalemia: Potassium was 2.7 on admission.  Secondary to diuretic use.  Holding HCTZ for now. -Potassium  citrate 80 mEq -Repeat BMP in AM  Bladder obstruction: Patient denies any urinary symptoms, she reports that she had to use her body completely and has no pain with urination.  On CT scan it showed a distended bladder.  Bladder scan showed a postvoid residual of 495 mL.  In and out cath was done.  Possible that this could be due to detrusor underactivity due to advanced age.  She may benefit from a urology referral on the outpatient basis. -Frequent post void residual bladder scans -If greater than 300 perform an in and out cath -If she requires another cath consider a indwelling catheter placement  Hypertension: She is on HCTZ at home.  However she seems to have developed some hyponatremia from this.  Will hold this for now.  Blood pressure up to 145/65.  -We will monitor for now -If blood pressure is sustained to above 180/110 may consider starting an  antihypertensive  Diabetes mellitus: Patient is on glimepiride 0.5 mg daily at home.  Last A1c was 5.4 08/07/2018.  Glucose was elevated to 173 on admission. -Sliding scale insulin sensitive -CBGs q4 hours  FEN: Holding fluids for now, replete lytes prn, 2g Na diet  VTE ppx: Lovenox  Code Status: FULL  Dispo: Admit patient to Inpatient with expected length of stay greater than 2 midnights.  Signed: Claudean Severance, MD 10/04/2018, 2:41 PM  Pager: [phone]

## 2018-10-04 NOTE — Progress Notes (Signed)
Admission RN went into room to ask patient some questions when pt admitted to taking 3 doses of her own glimepiride 1mg  and hydrochlorothiazide 25mg . This RN went into room with pt and translator and the pt stated that she only took one dose, which was her morning dose. Medication has been counted and will be taken to pharmacy and MD was made aware. MD by bedside and pt's nurse notified.

## 2018-10-04 NOTE — ED Notes (Signed)
Bladder scan completed post void, 495 ml resulted.

## 2018-10-04 NOTE — ED Provider Notes (Signed)
[facility] EMERGENCY DEPARTMENT Provider Note   CSN: [id number] Arrival date & time: 10/04/18  1104     History   Chief Complaint Chief Complaint  Patient presents with  . Abnormal Lab  . Constipation    HPI Yoshino T Bloodworth is a 84 y.o. female.  The history is provided by the patient and medical records. A language interpreter was used ([place] interpreter).  Abnormal Lab  Constipation   Associated symptoms include abdominal pain.   Miyako T Rosenkranz is a 84 y.o. female  with a PMH as listed below who presents to the Emergency Department complaining of constipation and abdominal pain.  Patient states that she was started on a new blood pressure medication (HCTZ per chart review) on October 3.  She has been taking this medication as directed, but has noticed worsening constipation.  She now has not had a bowel movement in the last week.  This is been causing her nausea and abdominal pain.  She was seen at the urgent care earlier today who did a chemistry panel and sent her to the emergency department for further work-up of her hyponatremia and hypokalemia.  She denies any fever, vomiting, back pain, urinary symptoms.  No syncopal events or seizures.   Past Medical History:  Diagnosis Date  . Degenerative disc disease   . Diabetes mellitus without complication ([facility])   . Hypertension   . Pruritus     Patient Active Problem List   Diagnosis Date Noted  . Diabetes mellitus type 2, controlled, without complications ([facility]) 12/18/2017  . Cardiac murmur 12/18/2017  . Left carotid bruit 12/18/2017  . Hyponatremia 06/01/2013  . Weakness 06/01/2013  . Cervical disc disease 05/25/2012  . Chronic pain syndrome 05/25/2012  . Osteoarthritis 05/25/2012  . Hyperlipidemia 05/30/2007  . HYPERTENSION, BENIGN ESSENTIAL 05/30/2007  . ALLERGIC RHINITIS 05/30/2007  . ASTEATOTIC ECZEMA 05/30/2007    Past Surgical History:  Procedure Laterality Date  . DILATION AND CURETTAGE,  DIAGNOSTIC / THERAPEUTIC    . NECK SURGERY       OB History   None      Home Medications    Prior to Admission medications   Medication Sig Start Date End Date Taking? Authorizing Provider  albuterol (PROVENTIL HFA;VENTOLIN HFA) 108 (90 Base) MCG/ACT inhaler Inhale 2 puffs into the lungs every 6 (six) hours as needed for wheezing or shortness of breath. 09/26/18  Yes Burky, Natalie B, NP  glimepiride (AMARYL) 1 MG tablet Take 0.5 tablets (0.5 mg total) by mouth daily with breakfast. 09/22/18  Yes Santiago, Irma M, MD  omeprazole (PRILOSEC) 40 MG capsule Take 1 capsule (40 mg total) by mouth daily. 09/22/18  Yes Santiago, Irma M, MD  blood glucose meter kit and supplies KIT Dispense on insurance preference. Check cbg every morning before breakfast and when feeling tired.. DX E11.9, E11.649 08/02/18   Santiago, Irma M, MD    Family History Family History  Family history unknown: Yes    Social History Social History   Tobacco Use  . Smoking status: Never Smoker  . Smokeless tobacco: Never Used  Substance Use Topics  . Alcohol use: No  . Drug use: No     Allergies   Lisinopril   Review of Systems Review of Systems  Gastrointestinal: Positive for abdominal pain, constipation and nausea. Negative for blood in stool, diarrhea and vomiting.  All other systems reviewed and are negative.    Physical Exam Updated Vital Signs BP 132/70 (BP Location: Right   Arm)   Pulse 62   Temp 98.1 F (36.7 C) (Oral)   Resp 16   SpO2 100%   Physical Exam  Constitutional: She appears well-developed and well-nourished. No distress.  HENT:  Head: Normocephalic and atraumatic.  Neck: Neck supple.  Cardiovascular: Normal rate, regular rhythm and normal heart sounds.  No murmur heard. Pulmonary/Chest: Effort normal and breath sounds normal. No respiratory distress.  Abdominal: Soft. She exhibits no distension.  Diffuse abdominal tenderness, most severe in the left lower quadrant.  No  rebound or guarding.  No CVA tenderness.  Neurological:  Alert and oriented x4.  Skin: Skin is warm and dry.  Nursing note and vitals reviewed.    ED Treatments / Results  Labs (all labs ordered are listed, but only abnormal results are displayed) Labs Reviewed  COMPREHENSIVE METABOLIC PANEL - Abnormal; Notable for the following components:      Result Value   Sodium 117 (*)    Potassium 2.7 (*)    Chloride 73 (*)    Glucose, Bld 173 (*)    GFR calc non Af Amer 58 (*)    All other components within normal limits  CBC WITH DIFFERENTIAL/PLATELET - Abnormal; Notable for the following components:   WBC 12.9 (*)    HCT 35.7 (*)    RDW 10.8 (*)    Neutro Abs 9.9 (*)    Monocytes Absolute 1.1 (*)    All other components within normal limits  LIPASE, BLOOD  URINALYSIS, ROUTINE W REFLEX MICROSCOPIC  OSMOLALITY  OSMOLALITY, URINE  SODIUM, URINE, RANDOM  BASIC METABOLIC PANEL  BASIC METABOLIC PANEL  BASIC METABOLIC PANEL  BASIC METABOLIC PANEL  LIPID PANEL    EKG None  Radiology No results found.  Procedures Procedures (including critical care time)  CRITICAL CARE Performed by:  P[place]    Total critical care time: 35 minutes  Critical care time was exclusive of separately billable procedures and treating other patients.  Critical care was necessary to treat or prevent imminent or life-threatening deterioration.  Critical care was time spent personally by me on the following activities: development of treatment plan with patient and/or surrogate as well as nursing, discussions with consultants, evaluation of patient's response to treatment, examination of patient, obtaining history from patient or surrogate, ordering and performing treatments and interventions, ordering and review of laboratory studies, ordering and review of radiographic studies, pulse oximetry and re-evaluation of patient's condition.   Medications Ordered in ED Medications  potassium  citrate (UROCIT-K) SR tablet 40 mEq (has no administration in time range)  iohexol (OMNIPAQUE) 300 MG/ML solution 75 mL (75 mLs Intravenous Contrast Given 10/04/18 1340)     Initial Impression / Assessment and Plan / ED Course  I have reviewed the triage vital signs and the nursing notes.  Pertinent labs & imaging results that were available during my care of the patient were reviewed by me and considered in my medical decision making (see chart for details).    Nithya T Norell is a 84 y.o. female who presents to ED from urgent care for concerns of hyponatremia and hypokalemia lab work done.  She reported to the urgent care today for concerns of constipation and abdominal pain.  On exam, patient is afebrile, hemodynamically stable.  She is alert and oriented x4.  Labs reviewed and notable for hyponatremia 117 and potassium of 2.7.  Runs of IV potassium started in the ED.  I have ordered a CT scan of the abdomen and pelvis for further   evaluation of her constipation and abdominal pain.  I have spoken with the internal medicine teaching service who will admit him follow-up on CT results.  Patient discussed with Dr. Alvino Chapel who agrees with treatment plan.    Final Clinical Impressions(s) / ED Diagnoses   Final diagnoses:  Hyponatremia  Hypokalemia  Constipation, unspecified constipation type    ED Discharge Orders    None       Alexes Menchaca, Ozella Almond, PA-C 10/04/18 1349    Davonna Belling, MD 10/04/18 1534

## 2018-10-04 NOTE — Progress Notes (Signed)
CRITICAL VALUE ALERT  Critical Value:  Sodium 118, Potassium 2.6  Date & Time Notied:  10/15 15:56  Provider Notified: Dr. Caron Presume  Orders Received/Actions taken: awaiting orders/response.

## 2018-10-04 NOTE — Discharge Instructions (Signed)
GO TO THE ER  You need treatment for the low sodium You need to stop the HCTZ for blood pressure You need to take miralax for constipation These will be further discussed at the emergency department

## 2018-10-04 NOTE — Progress Notes (Signed)
Paged by nursing staff about patient taking her HCTZ and glimepiride out of her purse and taking it. RN states that per translator, she took 3 tablets throughout the day. HCTZ and glimepiride bottles were confiscated and brought down to pharmacy. Discussed with patient at bedside with video interpreter Guerry Bruin [phone]) about her hyponatremia. Explained to the patient that we believe her HCTZ is the cause of her hyponatremia and she should not be taking it. She asks if she can get a different blood pressure medication if she is not going to be taking her HCTZ. Explained to the patient that currently her bp is 117/56 and she does not need to take any blood pressure medication at the moment. She also would like to know why her glimepiride was taken. Explained to the patient that she does not need to take her glimepiride inpatient and she will be given insulin during her stay for blood glucose control. Patient was unable to confirm understanding as when trying to illicit teach back, she abruptly changes topic of conversation to her constipation repeatedly. Explained to the patient that we are providing senokot for her constipation. Patient expresses hope that she will have bowel movement soon.  Gen: Well-developed, well nourished, NAD HEENT: NCAT head, hearing intact, EOMI, PERRL, No nasal discharge, MMM Neck: supple, ROM intact, no JVD, no cervical adenopathy CV: RRR, S1, S2 normal, No rubs, no murmurs, no gallops Pulm: CTAB, No rales, no wheezes, no dullness to percussion  Abd: Soft, BS+, Diffuse tenderness to palpation, No rebound, no guarding Extm: ROM intact, Peripheral pulses intact, No peripheral edema Skin: Dry, Warm, normal turgor, no rashes, lesions, wounds.  Neuro: AAOx3, Cranial Nerve II-XII intact, No tremors. Psych: Normal mood and affect  Hyponatremia due to HCTZ overdose Trending BMPs and Na correcting by oral intake 117->118->119. Just informed she took additional doses of HCTZ. Will  continue to monitor serum sodium and see if she overcorrects. Currently not endorsing symptoms of severe hyponatremia. Hypertonic saline not indicated at the moment - C/w trend BMPs, monitor vitals

## 2018-10-04 NOTE — ED Triage Notes (Addendum)
Pt states she is constipated x 5 days. Pt states the b/p is making her constipated.

## 2018-10-04 NOTE — Progress Notes (Signed)
Robin Andrews [id number] Admission Data: 10/04/2018 6:22 PM Attending Provider: Gust Rung, DO  [id number], Meda Coffee, MD Consults/ Treatment Team:   Robin Andrews is a 84 y.o. female patient admitted from ED awake, alert  & orientated  X 3,  Full Code, VSS - Blood pressure (!) 143/77, pulse 69, temperature 98.3 F (36.8 C), temperature source Oral, resp. rate 17, SpO2 98 %., O2 on RA, no c/o shortness of breath, no c/o chest pain, no distress noted. Tele # [id number] placed and pt is currently running:normal sinus rhythm.   IV site WDL:  upper arm left, condition patent and no redness with a transparent dsg that's clean dry and intact.  Allergies:   Allergies  Allergen Reactions  . Lisinopril      Past Medical History:  Diagnosis Date  . Degenerative disc disease   . Diabetes mellitus without complication (HCC)   . Hypertension   . Pruritus    Communicated with patient via stratus video interpreter. Patient speaks [place] Patient denies any pain but her abdomen is tender to touch. Fleet enema administered with 1 small bowel movement following. Patient up x stand by assistance to bedside commode. Patient educated on unit expectations. Patient needs reinforcement. Son and daughter at bedside with patient. Bed alarm on and audible.  No evidence of skin break down noted on exam.  Will cont to monitor and assist as needed.  Lyndal Pulley, RN 10/04/2018 6:22 PM

## 2018-10-04 NOTE — ED Notes (Signed)
Pt in and out catheterized for post void residual of 495. Pt tolerated procedure well.

## 2018-10-04 NOTE — ED Notes (Signed)
Chem 8 drawn and results suggested "bad sample"  Blood drawn again and re-run with similar results.  Dr. Delton See notified.

## 2018-10-04 NOTE — ED Provider Notes (Signed)
[facility]    CSN: [id number] Arrival date & time: 10/04/18  0911     History   Chief Complaint Chief Complaint  Patient presents with  . Constipation    HPI Robin Andrews is a 84 y.o. female.   HPI  84 year old [place] female here with her husband.  Husband does most of the speaking.  They declined an interpreter.  He states that there are 3 problems.  First, she has not had a bowel movement for a week and she is distended.  "She looks pregnant".  She denies any abdominal pain.  She states that she is still eating.  No nausea or vomiting. Second problem is the hydrochlorothiazide.  He feels that it is not controlling her blood pressure.  He is only been on it for couple of weeks.  He is uncertain whether she is taking a half a pill a day as instructed, or whole pill a day. Third problem is that she feels weak.  She is very tired.  She does not want to get up and do anything.  She has no energy.  Past Medical History:  Diagnosis Date  . Degenerative disc disease   . Diabetes mellitus without complication ([place])   . Hypertension   . Pruritus     Patient Active Problem List   Diagnosis Date Noted  . Diabetes mellitus type 2, controlled, without complications ([place]) 86/57/8469  . Cardiac murmur 12/18/2017  . Left carotid bruit 12/18/2017  . Hyponatremia 06/01/2013  . Weakness 06/01/2013  . Cervical disc disease 05/25/2012  . Chronic pain syndrome 05/25/2012  . Osteoarthritis 05/25/2012  . Hyperlipidemia 05/30/2007  . HYPERTENSION, BENIGN ESSENTIAL 05/30/2007  . ALLERGIC RHINITIS 05/30/2007  . ASTEATOTIC ECZEMA 05/30/2007    Past Surgical History:  Procedure Laterality Date  . ANTERIOR CERVICAL DECOMP/DISCECTOMY FUSION  02/2010   C3-6/notes 03/09/2010  . BACK SURGERY    . DILATION AND CURETTAGE, DIAGNOSTIC / THERAPEUTIC      OB History   None      Home Medications    Prior to Admission medications   Medication Sig Start Date End Date Taking?  Authorizing Provider  albuterol (PROVENTIL HFA;VENTOLIN HFA) 108 (90 Base) MCG/ACT inhaler Inhale 2 puffs into the lungs every 6 (six) hours as needed for wheezing or shortness of breath. 09/26/18   Zigmund Gottron, NP  blood glucose meter kit and supplies KIT Dispense on insurance preference. Check cbg every morning before breakfast and when feeling tired.. DX E11.9, E11.649 08/02/18   Rutherford Guys, MD  glimepiride (AMARYL) 1 MG tablet Take 0.5 tablets (0.5 mg total) by mouth daily with breakfast. 09/22/18   Rutherford Guys, MD  omeprazole (PRILOSEC) 40 MG capsule Take 1 capsule (40 mg total) by mouth daily. 09/22/18   Rutherford Guys, MD    Family History Family History  Family history unknown: Yes    Social History Social History   Tobacco Use  . Smoking status: Never Smoker  . Smokeless tobacco: Never Used  Substance Use Topics  . Alcohol use: No  . Drug use: No     Allergies   Lisinopril   Review of Systems Review of Systems  Constitutional: Positive for appetite change and fatigue. Negative for chills and fever.  HENT: Negative for ear pain and sore throat.   Eyes: Negative for pain and visual disturbance.  Respiratory: Negative for cough and shortness of breath.   Cardiovascular: Negative for chest pain and palpitations.  Gastrointestinal: Positive  for abdominal distention and constipation. Negative for abdominal pain, nausea and vomiting.  Genitourinary: Negative for dysuria and hematuria.  Musculoskeletal: Negative for arthralgias and back pain.  Skin: Negative for color change and rash.  Neurological: Positive for weakness. Negative for seizures, syncope and headaches.  Psychiatric/Behavioral: Negative for decreased concentration and dysphoric mood.  All other systems reviewed and are negative.    Physical Exam Triage Vital Signs ED Triage Vitals [10/04/18 0926]  Enc Vitals Group     BP 138/75     Pulse Rate 68     Resp 15     Temp 98.8 F (37.1 C)      Temp src      SpO2 100 %     Weight      Height      Head Circumference      Peak Flow      Pain Score      Pain Loc      Pain Edu?      Excl. in [place]?    No data found.  Updated Vital Signs BP 138/75 (BP Location: Left Arm)   Pulse 68   Temp 98.8 F (37.1 C)   Resp 15   SpO2 100%      Physical Exam  Constitutional: She appears well-developed and well-nourished. She appears distressed.  Appears tired.  HENT:  Head: Normocephalic and atraumatic.  Mouth/Throat: Oropharynx is clear and moist.  Eyes: Pupils are equal, round, and reactive to light. Conjunctivae are normal.  Neck: Normal range of motion.  Cardiovascular: Normal rate and regular rhythm.  Pulmonary/Chest: Effort normal and breath sounds normal. No respiratory distress.  Abdominal: Soft. She exhibits distension.  Rounded abdomen.  Nontender.  Bowel sounds diminished  Musculoskeletal: Normal range of motion. She exhibits no edema.  Neurological: She is alert.  Skin: Skin is warm and dry.     UC Treatments / Results  Labs (all labs ordered are listed, but only abnormal results are displayed) Labs Reviewed  POCT I-STAT, CHEM 8 - Abnormal; Notable for the following components:      Result Value   Sodium 111 (*)    Potassium 2.8 (*)    Chloride 72 (*)    Glucose, Bld 286 (*)    Calcium, Ion 0.86 (*)    All other components within normal limits  POCT I-STAT, CHEM 8 - Abnormal; Notable for the following components:   Sodium 113 (*)    Potassium 3.4 (*)    Chloride 71 (*)    Glucose, Bld 252 (*)    Calcium, Ion 1.03 (*)    All other components within normal limits    EKG None  Radiology Ct Abdomen Pelvis W Contrast  Result Date: 10/04/2018 CLINICAL DATA:  Constipation for 5 days. EXAM: CT ABDOMEN AND PELVIS WITH CONTRAST TECHNIQUE: Multidetector CT imaging of the abdomen and pelvis was performed using the standard protocol following bolus administration of intravenous contrast. CONTRAST:  79m  OMNIPAQUE IOHEXOL 300 MG/ML  SOLN COMPARISON:  None. FINDINGS: Lower chest:  Coronary calcification. Hepatobiliary: No focal liver abnormality.No evidence of biliary obstruction or stone. Pancreas: Unremarkable. Spleen: Unremarkable. Adrenals/Urinary Tract: Negative adrenals. No hydronephrosis or stone. Unremarkable bladder. Stomach/Bowel: No obstruction. No appendicitis. History of constipation. No excessive stool retention or rectal impaction Vascular/Lymphatic: No acute vascular abnormality. Extensive atherosclerotic calcification of the aorta. No mass or adenopathy. Reproductive:No pathologic finding. Other: No ascites or pneumoperitoneum. Musculoskeletal: No acute abnormalities. Degenerative disc narrowing and bulging with facet hypertrophy in  the lumbar spine. IMPRESSION: 1. No acute finding. 2. History of constipation. No excessive stool retention or rectal impaction. 3.  Aortic Atherosclerosis (ICD10-I70.0). Electronically Signed   By: Monte Fantasia M.D.   On: 10/04/2018 14:19    Procedures Procedures (including critical care time)  Medications Ordered in UC Medications - No data to display  Initial Impression / Assessment and Plan / UC Course  I have reviewed the triage vital signs and the nursing notes.  Pertinent labs & imaging results that were available during my care of the patient were reviewed by me and considered in my medical decision making (see chart for details).     Discussed with family that the chemistries are abnormal, we did a hydrochlorothiazide, and that she needs to go to the emergency department for additional treatment Final Clinical Impressions(s) / UC Diagnoses   Final diagnoses:  Hyponatremia  Weakness  Constipation, unspecified constipation type     Discharge Instructions     GO TO THE ER  You need treatment for the low sodium You need to stop the HCTZ for blood pressure You need to take miralax for constipation These will be further discussed at  the emergency department    ED Prescriptions    None     Controlled Substance Prescriptions [place] Controlled Substance Registry consulted? Not Applicable   Raylene Everts, MD 10/04/18 2123

## 2018-10-04 NOTE — Progress Notes (Signed)
CRITICAL VALUE ALERT  Critical Value:  Sodium 119, potassium 2.8  Date & Time Notied:  10/15, 18:10  Provider Notified: Dr. Alver Fisher  Orders Received/Actions taken: awaiting response/orders.

## 2018-10-04 NOTE — ED Triage Notes (Signed)
Patient sent to ED by [facility] for low sodium level - checked twice over there (111, 113). Patient's c/o generalized weakness and constipation. Denies N/V/D.

## 2018-10-05 DIAGNOSIS — N3289 Other specified disorders of bladder: Secondary | ICD-10-CM

## 2018-10-05 DIAGNOSIS — E876 Hypokalemia: Secondary | ICD-10-CM

## 2018-10-05 DIAGNOSIS — I10 Essential (primary) hypertension: Secondary | ICD-10-CM

## 2018-10-05 DIAGNOSIS — Z79899 Other long term (current) drug therapy: Secondary | ICD-10-CM

## 2018-10-05 DIAGNOSIS — E871 Hypo-osmolality and hyponatremia: Principal | ICD-10-CM

## 2018-10-05 DIAGNOSIS — Z7984 Long term (current) use of oral hypoglycemic drugs: Secondary | ICD-10-CM

## 2018-10-05 DIAGNOSIS — E119 Type 2 diabetes mellitus without complications: Secondary | ICD-10-CM

## 2018-10-05 DIAGNOSIS — E861 Hypovolemia: Secondary | ICD-10-CM

## 2018-10-05 LAB — BASIC METABOLIC PANEL
ANION GAP: 17 — AB (ref 5–15)
ANION GAP: 12 (ref 5–15)
ANION GAP: 12 (ref 5–15)
Anion gap: 13 (ref 5–15)
BUN: 16 mg/dL (ref 8–23)
BUN: 15 mg/dL (ref 8–23)
BUN: 17 mg/dL (ref 8–23)
BUN: 24 mg/dL — ABNORMAL HIGH (ref 8–23)
CALCIUM: 9 mg/dL (ref 8.9–10.3)
CALCIUM: 9.2 mg/dL (ref 8.9–10.3)
CALCIUM: 8.9 mg/dL (ref 8.9–10.3)
CALCIUM: 9 mg/dL (ref 8.9–10.3)
CO2: 31 mmol/L (ref 22–32)
CO2: 30 mmol/L (ref 22–32)
CO2: 32 mmol/L (ref 22–32)
CO2: 29 mmol/L (ref 22–32)
CREATININE: 0.93 mg/dL (ref 0.44–1.00)
CREATININE: 1.18 mg/dL — AB (ref 0.44–1.00)
Chloride: 76 mmol/L — ABNORMAL LOW (ref 98–111)
Chloride: 74 mmol/L — ABNORMAL LOW (ref 98–111)
Chloride: 78 mmol/L — ABNORMAL LOW (ref 98–111)
Chloride: 83 mmol/L — ABNORMAL LOW (ref 98–111)
Creatinine, Ser: 1.03 mg/dL — ABNORMAL HIGH (ref 0.44–1.00)
Creatinine, Ser: 1.24 mg/dL — ABNORMAL HIGH (ref 0.44–1.00)
GFR calc Af Amer: >60 mL/min (ref >60)
GFR calc Af Amer: 45 mL/min — ABNORMAL LOW (ref >60)
GFR, EST AFRICAN AMERICAN: 56 mL/min — AB (ref >60)
GFR, EST AFRICAN AMERICAN: 47 mL/min — AB (ref >60)
GFR, EST NON AFRICAN AMERICAN: 48 mL/min — AB (ref >60)
GFR, EST NON AFRICAN AMERICAN: 54 mL/min — AB (ref >60)
GFR, EST NON AFRICAN AMERICAN: 41 mL/min — AB (ref >60)
GFR, EST NON AFRICAN AMERICAN: 38 mL/min — AB (ref >60)
GLUCOSE: 116 mg/dL — AB (ref 70–99)
GLUCOSE: 156 mg/dL — AB (ref 70–99)
GLUCOSE: 102 mg/dL — AB (ref 70–99)
Glucose, Bld: 72 mg/dL (ref 70–99)
POTASSIUM: 3.3 mmol/L — AB (ref 3.5–5.1)
Potassium: 3.4 mmol/L — ABNORMAL LOW (ref 3.5–5.1)
Potassium: 4.2 mmol/L (ref 3.5–5.1)
Potassium: 4.3 mmol/L (ref 3.5–5.1)
Sodium: 120 mmol/L — ABNORMAL LOW (ref 135–145)
Sodium: 121 mmol/L — ABNORMAL LOW (ref 135–145)
Sodium: 122 mmol/L — ABNORMAL LOW (ref 135–145)
Sodium: 124 mmol/L — ABNORMAL LOW (ref 135–145)

## 2018-10-05 LAB — GLUCOSE, CAPILLARY
GLUCOSE-CAPILLARY: 114 mg/dL — AB (ref 70–99)
GLUCOSE-CAPILLARY: 202 mg/dL — AB (ref 70–99)
GLUCOSE-CAPILLARY: 57 mg/dL — AB (ref 70–99)
GLUCOSE-CAPILLARY: 86 mg/dL (ref 70–99)
Glucose-Capillary: 106 mg/dL — ABNORMAL HIGH (ref 70–99)
Glucose-Capillary: 145 mg/dL — ABNORMAL HIGH (ref 70–99)
Glucose-Capillary: 153 mg/dL — ABNORMAL HIGH (ref 70–99)
Glucose-Capillary: 125 mg/dL — ABNORMAL HIGH (ref 70–99)

## 2018-10-05 MED ORDER — INSULIN ASPART 100 UNIT/ML ~~LOC~~ SOLN: 0.0000 [IU] | Freq: Every day | SUBCUTANEOUS | Status: DC

## 2018-10-05 MED ORDER — ENOXAPARIN SODIUM 30 MG/0.3ML ~~LOC~~ SOLN
30.0000 mg | SUBCUTANEOUS | Status: DC
  Administered 2018-10-05: 30 mg via SUBCUTANEOUS
  Filled 2018-10-05: qty 0.3

## 2018-10-05 MED ORDER — POTASSIUM CHLORIDE CRYS ER 20 MEQ PO TBCR
40.0000 meq | EXTENDED_RELEASE_TABLET | Freq: Once | ORAL | Status: AC
  Administered 2018-10-05: 40 meq via ORAL
  Filled 2018-10-05: qty 2

## 2018-10-05 MED ORDER — INSULIN ASPART 100 UNIT/ML ~~LOC~~ SOLN: 0.0000 [IU] | Freq: Three times a day (TID) | SUBCUTANEOUS | Status: DC

## 2018-10-05 NOTE — Progress Notes (Signed)
   Subjective:  Per report from night team, Robin Andrews took her hydrochlorothiazide medications from her outpatient prescription last night.  These medications were held because of her hypotonic hyponatremia.  Her HCTZ was promptly confiscated.  Robin Andrews was very upset this morning she states that someone had stolen her close last night whenever they were cleaning.  She is also upset that the nurses never came to remove the feces from her toilet.  She states that she is frustrated that no one really understands her.  She does state she is no longer constipated and has had multiple bowel movements overnight.  She denies any other complaints including chest pain, shortness breath, abdominal pain.  Objective:  Vital signs in last 24 hours: Vitals:   10/04/18 2025 10/04/18 2027 10/04/18 2359 10/05/18 0408  BP: (!) 117/51  123/61 138/68  Pulse: 65     Resp:      Temp:  98.6 F (37 C) 98.6 F (37 C) 98.3 F (36.8 C)  TempSrc:  Oral Oral Oral  SpO2: 96%      Physical Exam  Constitutional: She appears well-developed and well-nourished.  HENT:  Head: Normocephalic and atraumatic.  Eyes: EOM are normal.  Neck: Normal range of motion. Neck supple.  Cardiovascular: Normal rate, regular rhythm and normal heart sounds.  Pulmonary/Chest: Effort normal and breath sounds normal.  Abdominal: Soft. Bowel sounds are normal. There is no tenderness.  Musculoskeletal: She exhibits no edema or tenderness.  Neurological: She is alert.  Skin: Skin is warm and dry.  Psychiatric:  Very teary throughout our conversation.  She continues to say the same things over and ever again stating "someone stole my clothes".  Nursing note and vitals reviewed.   Assessment/Plan:  Active Problems:   Hyponatremia   Robin Andrews is an 84 year old female who was admitted for hypovolemic hypotonic hyponatremia likely secondary to hydrochlorothiazide use and poor p.o. Intake.  Her diuretic was stopped last night.  Ms.  Robin Andrews however took several tablets from her outpatient prescription after being transferred to the unit. Despite this, her sodium is slowly improving and is now 124 @1600  without need for hypertonic saline.  She will likely need 1 more day of monitoring without immediate intervention.  Hypovolemic Hypotonic Hyponatremia 1.  Continue to repeat BMP every 6 hours 2.  Continue to hold hydrochlorothiazide  Hypokalemia: Resolved 1. continue to monitor  Constipation: Resolved 1. continue to monitor  Bladder Obstruction: CT scan performed yesterday showed distended bladder.  Letter scan showed a post void residual Robin Andrews 95 mL.  In and out cath was done.  Patient denies any urinary symptoms.  Her symptoms are likely secondary to history of severe muscle underactivity and may benefit from outpatient urology referral. 1. Continue to monitor with postvoid residual bladder scans  Hypertension: Held home HCTZ.  Blood pressure remains within normal limits. 1.  Continue monitoring  T2DM: Home medications include glimepiride 0.5 mg daily.  Last A1c was 5.4 on 08/07/2018.  Capillary glucose during admission has been between 100-150 and she has any minimal fast acting insulin. 1.  Continue sliding scale insulin 2.  CBGs qhs  Dispo: Anticipated discharge in approximately tomorrow.  Synetta Shadow, MD 10/05/2018, 7:11 AM Pager: [phone]

## 2018-10-05 NOTE — Progress Notes (Signed)
Inpatient Diabetes Program Recommendations  AACE/ADA: New Consensus Statement on Inpatient Glycemic Control (2015)  Target Ranges:  Prepandial:   less than 140 mg/dL      Peak postprandial:   less than 180 mg/dL (1-2 hours)      Critically ill patients:  140 - 180 mg/dL   Lab Results  Component Value Date   GLUCAP 145 (H) 10/05/2018   HGBA1C 5.4 08/02/2018    Review of Glycemic Control Results for Robin Andrews, BRODOWSKI (MRN [id number]) as of 10/05/2018 11:39  Ref. Range 10/04/2018 20:24 10/05/2018 00:01 10/05/2018 00:35 10/05/2018 03:58 10/05/2018 04:13 10/05/2018 08:03  Glucose-Capillary Latest Ref Range: 70 - 99 mg/dL 409 (H) 57 (L) 86 811 (H) 114 (H) 145 (H)   Diabetes history: DM Outpatient Diabetes medications: Amaryl 0.5 mg daily Current orders for Inpatient glycemic control:  Novolog sensitive q 4 hours Inpatient Diabetes Program Recommendations:   Please consider changing Novolog correction to tid with meals and HS (instead of q 4 hours).   Thanks,  Beryl Meager, RN, BC-ADM Inpatient Diabetes Coordinator Pager [phone] (8a-5p)

## 2018-10-05 NOTE — Progress Notes (Signed)
Internal Medicine Attending:   I saw and examined the patient. I reviewed Dr Prince's note and I agree with the resident's findings and plan as documented in the resident's note- but have changed sliding scale insulin to sensitive with meals. ALso on my examination patients clothes had been found and was not longer in tearful mood.

## 2018-10-05 NOTE — Progress Notes (Addendum)
Initial Nutrition Assessment  DOCUMENTATION CODES:   Not applicable  INTERVENTION:  Ensure Enlive BID. Each supplement provides 350 kcal and 20 grams protein.   NUTRITION DIAGNOSIS:   Inadequate oral intake related to decreased appetite as evidenced by per patient/family report, meal completion < 50%.  GOAL:   Patient will meet greater than or equal to 90% of their needs  MONITOR:   PO intake, Supplement acceptance  REASON FOR ASSESSMENT:   Malnutrition Screening Tool   ASSESSMENT:   Robin Andrews is an 84 yo [place] speaking female with PMH of type 2 diabetes and HTN admitted for hyponatremia.  Student dietitian visited patient at bedside. Hx obtained using English-speaking grandson as interpreter. Grandson states she ate breakfast this AM; per chart 25% meal completion was documented. He says she is still feeling "a little weak". Ensure bottle was at bedside; grandson says she likes them and patient confirms.  Robin Andrews says that patient lives at home and consumes typical [place] cultural diet for 3 meals a day including rice, vegetables, soup, meat, eggs, and bread. She doesn't snack and drinks water. She has type 2 diabetes which she manages with oral medications.   Per grandson patient endorses weight loss; she doesn't know how much. Per chart her weight has been stable. No notable depletions on physical exam; however patient reports pain/weakness on the right side.   Medications reviewed and include: SSI, Protonix, Senna Labs reviewed: CBG 57-145, sodium 121, potassium 3.4  NUTRITION - FOCUSED PHYSICAL EXAM:    Most Recent Value  Orbital Region  No depletion  Upper Arm Region  No depletion  Thoracic and Lumbar Region  No depletion  Buccal Region  No depletion  Temple Region  No depletion  Clavicle Bone Region  No depletion  Clavicle and Acromion Bone Region  No depletion  Scapular Bone Region  No depletion  Dorsal Hand  No depletion  Patellar Region  No  depletion  Anterior Thigh Region  No depletion  Posterior Calf Region  No depletion  Edema (RD Assessment)  None  Hair  Reviewed  Eyes  Reviewed  Mouth  Reviewed  Skin  Reviewed  Nails  Reviewed      Diet Order:   Diet Order            Diet 2 gram sodium Room service appropriate? Yes; Fluid consistency: Thin  Diet effective now              EDUCATION NEEDS:   No education needs have been identified at this time  Skin:  Skin Assessment: Reviewed RN Assessment  Last BM:  10/16 medium type 3  Height:   Ht Readings from Last 1 Encounters:  10/05/18 4\' 11"  (1.499 m)    Weight:   Wt Readings from Last 1 Encounters:  10/05/18 49 kg    Ideal Body Weight:  44.5 kg  BMI:  Body mass index is 21.82 kg/m.  Estimated Nutritional Needs:   Kcal:  1400-1600  Protein:  70-80  Fluid:  >/=1.5 L   Robin Andrews, Dietetic Intern [phone]

## 2018-10-06 DIAGNOSIS — T502X5A Adverse effect of carbonic-anhydrase inhibitors, benzothiadiazides and other diuretics, initial encounter: Secondary | ICD-10-CM

## 2018-10-06 DIAGNOSIS — K5900 Constipation, unspecified: Secondary | ICD-10-CM

## 2018-10-06 DIAGNOSIS — Z23 Encounter for immunization: Secondary | ICD-10-CM | POA: Diagnosis not present

## 2018-10-06 LAB — BASIC METABOLIC PANEL
ANION GAP: 11 (ref 5–15)
Anion gap: 14 (ref 5–15)
BUN: 23 mg/dL (ref 8–23)
BUN: 20 mg/dL (ref 8–23)
CALCIUM: 8.8 mg/dL — AB (ref 8.9–10.3)
CALCIUM: 8.9 mg/dL (ref 8.9–10.3)
CHLORIDE: 84 mmol/L — AB (ref 98–111)
CO2: 28 mmol/L (ref 22–32)
CO2: 27 mmol/L (ref 22–32)
CREATININE: 0.93 mg/dL (ref 0.44–1.00)
Chloride: 83 mmol/L — ABNORMAL LOW (ref 98–111)
Creatinine, Ser: 1.16 mg/dL — ABNORMAL HIGH (ref 0.44–1.00)
GFR calc Af Amer: 48 mL/min — ABNORMAL LOW (ref >60)
GFR calc non Af Amer: 42 mL/min — ABNORMAL LOW (ref >60)
GFR calc non Af Amer: 54 mL/min — ABNORMAL LOW (ref >60)
GLUCOSE: 201 mg/dL — AB (ref 70–99)
GLUCOSE: 135 mg/dL — AB (ref 70–99)
Potassium: 4.4 mmol/L (ref 3.5–5.1)
Potassium: 3.9 mmol/L (ref 3.5–5.1)
Sodium: 123 mmol/L — ABNORMAL LOW (ref 135–145)
Sodium: 124 mmol/L — ABNORMAL LOW (ref 135–145)

## 2018-10-06 LAB — POCT I-STAT, CHEM 8
BUN: 17 mg/dL (ref 8–23)
CALCIUM ION: 0.86 mmol/L — AB (ref 1.15–1.40)
Chloride: 72 mmol/L — ABNORMAL LOW (ref 98–111)
Creatinine, Ser: 0.8 mg/dL (ref 0.44–1.00)
GLUCOSE: 286 mg/dL — AB (ref 70–99)
HCT: 38 % (ref 36.0–46.0)
Hemoglobin: 12.9 g/dL (ref 12.0–15.0)
Potassium: 2.8 mmol/L — ABNORMAL LOW (ref 3.5–5.1)
SODIUM: 111 mmol/L — AB (ref 135–145)
TCO2: 30 mmol/L (ref 22–32)

## 2018-10-06 LAB — GLUCOSE, CAPILLARY: Glucose-Capillary: 134 mg/dL — ABNORMAL HIGH (ref 70–99)

## 2018-10-06 NOTE — Discharge Summary (Signed)
Name: Robin Andrews MRN: [id number] DOB: 08-20-1931 84 y.o. PCP: Robin Guys, MD  Date of Admission: 10/04/2018 11:08 AM Date of Discharge: 10/06/2018 Attending Physician: Joni Reining  Discharge Diagnosis: 1.  hyponatremia Constipation Generalized weakness  Discharge Medications: Allergies as of 10/06/2018      Reactions   Hydrochlorothiazide    Severe hyponatremia resulting in hospitalization   Lisinopril       Medication List    STOP taking these medications   albuterol 108 (90 Base) MCG/ACT inhaler Commonly known as:  PROVENTIL HFA;VENTOLIN HFA     TAKE these medications   blood glucose meter kit and supplies Kit Dispense on insurance preference. Check cbg every morning before breakfast and when feeling tired.. DX E11.9, E11.649   glimepiride 1 MG tablet Commonly known as:  AMARYL Take 0.5 tablets (0.5 mg total) by mouth daily with breakfast.   omeprazole 40 MG capsule Commonly known as:  PRILOSEC Take 1 capsule (40 mg total) by mouth daily.       Disposition and follow-up:   Robin Andrews was discharged from [facility] in Good condition.  At the hospital follow up visit please address:  hyponatremia Constipation Generalized weakness  -follow up bmp, pt will meet with PCP to discuss alternatives for bp control as she has had difficulty tolerating ccb, hctz, acei.    2.  Labs / imaging needed at time of follow-up: bmp  3.  Pending labs/ test needing follow-up: none  Follow-up Appointments: Follow-up Information    Robin Guys, MD. Call.   Specialty:  Family Medicine Why:  Please make an appointment with them next week to follow-up with your primary care doctor to recheck your blood pressure and potentially add another blood pressure medicine. Contact information: [address] [place] [address] [phone]           Hospital Course by problem list: hyponatremia Constipation Generalized  weakness  Patient was asked by her PCP to come to the emergency room due to continued constipation, generalized weakness and she was found to have a newly profound hyponatremia.  She had recently been started on hydrochlorothiazide and was thought to have some poor intake of osms at baseline which led to quick deterioration of her sodium levels.  We held the patient's hydrochlorothiazide and gave her diet, her sodium was coming up nicely we were careful not to overcorrect however in the evening of her admission she took more hydrochlorothiazide out of her purse this delayed her improvement temporarily.  However by the following day her medications now confiscated, she was once again on good track her sodium level is climbing back up appropriately and she was discharged with a sodium of 124.  Initially her sodium was 111.  She had no neurologic symptoms as her sodium slowly corrected.  She was discharged and advised to follow up closely with her PCP.    Discharge Vitals:   BP (!) 156/59 (BP Location: Right Arm)   Pulse 77   Temp 99.2 F (37.3 C)   Resp 17   Ht _0  (1.499 m)   Wt 49 kg   SpO2 97%   BMI 21.82 kg/m   Pertinent Labs, Studies, and Procedures:  BMP Latest Ref Rng & Units 10/06/2018 10/06/2018 10/05/2018  Glucose 70 - 99 mg/dL 135(H) 201(H) 102(H)  BUN 8 - 23 mg/dL 20 23 24(H)  Creatinine 0.44 - 1.00 mg/dL 0.93 1.16(H) 1.24(H)  BUN/Creat Ratio 12 - 28 - - -  Sodium 135 - 145 mmol/L 124(L) 123(L) 124(L)  Potassium 3.5 - 5.1 mmol/L 3.9 4.4 4.3  Chloride 98 - 111 mmol/L 83(L) 84(L) 83(L)  CO2 22 - 32 mmol/L _0 Calcium 8.9 - 10.3 mg/dL 8.9 8.8(L) 9.0   Lab Results  Component Value Date   TSH 0.693 10/04/2018   Osmolality 275 - 295 mOsm/kg 254Low     Sodium, Ur mmol/L 34    Osmolality, Ur 300 - 900 mOsm/kg 163Low     Lipase 11 - 51 U/L 108High      Discharge Instructions: Discharge Instructions    Diet - low sodium heart healthy   Complete by:  As directed     Discharge instructions   Complete by:  As directed    Please stop taking your blood pressure medication called hydrochlorothiazide.  This is what caused the constipation and electrolyte abnormalities.  We recommend that you follow-up with your primary care doctor so that she may recheck your blood pressure and determine whether you need another blood pressure medication.  Hy ngung dng thuoc huyet p cua ban duoc goi l Hydrochlorothiazide. y l nhung g gy ra to bn v dien giai bat thuong cua ban. Chng ti Dominica ban nn theo di voi bc si cham [place] chnh de c ay c the [employer] p cua ban v xc [place]mer xem ban c can dng thuoc huyet p khc khng.   Increase activity slowly   Complete by:  As directed       Signed: Katherine Roan, MD 10/06/2018, 12:25 PM

## 2018-10-06 NOTE — Progress Notes (Signed)
Patient discharged to private residence accompanied by grandson.  Patient does not seem to understand why she is not on any antihypertensives any longer.  It was explained to both her and her grandson that this needs to be discussed with her primary doctor and to make an appointment with them next week to discuss it.  I also told her via interpreter that she did not require the medication at this time and that it caused her symptoms (made her "sick").  She does not understand the concept of hyponatremia, but the grandson appears to, as authenticated by the "teach-back" method.  Patient was weepy at times, but this could be attributed to her anxiety of going home.  Exited via wheelchair and escorted by volunteer.  Stopped at pharmacy to retrieve medications.

## 2018-10-06 NOTE — Progress Notes (Signed)
Subjective: [name] has all of her pedestrian close on.  She continues to say that she is ready to go home.  She also asks if we can take out the IV right now.  She states that she is overall doing much better.  I explained to her the need to stop the hydrochlorothiazide medication.  She expressed understanding and agreement.  I also recommended that she follow-up with her primary care doctor so that they may repeat her blood work and blood pressure.  She adamantly states that she does not want to go back to her primary care doctor.  She is discouraged by the fact that she was put on hydrochlorothiazide which caused her constipation.  I again reiterated the need for close follow-up with her primary care doctor.  Objective:  Vital signs in last 24 hours: Vitals:   10/05/18 1605 10/05/18 1618 10/05/18 2216 10/06/18 0616  BP:  (!) 152/64 (!) 146/61 (!) 156/59  Pulse:  79 83 77  Resp:   16 17  Temp: 98 F (36.7 C) 98.7 F (37.1 C) 98.8 F (37.1 C) 99.2 F (37.3 C)  TempSrc: Oral Oral    SpO2:  95% 100% 97%  Weight:      Height:       Physical Exam  Constitutional: She appears well-developed and well-nourished.  HENT:  Head: Normocephalic and atraumatic.  Eyes: EOM are normal.  Neck: Normal range of motion.  Cardiovascular: Normal rate and regular rhythm.  Pulmonary/Chest: Effort normal and breath sounds normal.  Musculoskeletal: She exhibits no edema or tenderness.  Neurological: She is alert.  Skin: Skin is warm and dry.  Psychiatric:  Very anxious throughout our conversation.  Nursing note and vitals reviewed.   Assessment/Plan:  Active Problems:   Hyponatremia   Hypokalemia  [name] is an 84 year old female who was admitted for hypovolemic hypotonic hyponatremia likely secondary to hydrochlorothiazide use and poor p.o. Intake.  After discontinuing her low hydrochlorothiazide, her sodium has increased and remained stable at 124 this morning.  We recommended that she  stop taking her hydrochlorothiazide altogether.  She expressed understanding and agreement.  She has an unknown allergy to lisinopril and GI upset with amlodipine (unlikely to be the actual cause).  Due to her multiple side effects to blood pressure medication, we will hold off on adding a blood pressure medication while in hospital.  We recommend that she follow-up with her primary care doctor to have her blood pressure rechecked with likely addition of the new blood pressure medication.  She states that she will not follow-up with her PCP because of anger of being put on a medication that caused her to have constipation.  We will plan on speaking to her family when they arrive later this morning.  Hypovolemic Hypotonic Hyponatremia: Sodium stable at 124. 1.  Discontinue use of hydrochlorothiazide 2. She will need a repeat BMP at her follow-up visit.  Hypertension: Held home HCTZ.  Blood pressure increased throughout the day yesterday with systolics between 140-150s.  Due to her multiple side effects to blood pressure medicines we will hold off on starting a new antihypertensive medicine while in hospital.  1.    She will need to follow-up with her primary care provider to have her blood pressure checked with likely addition of new blood pressure medication.  T2DM: Home medications include glimepiride 0.5 mg daily.  Last A1c was 5.4 on 08/07/2018.  Capillary glucose during admission ranged between 100-150 and she has any minimal fast  acting insulin. 1.    Discharged with home glimepiride 0.5 mg daily  Dispo: She will be discharged today.  Synetta Shadow, MD 10/06/2018, 6:51 AM Pager: [phone]

## 2018-10-10 ENCOUNTER — Other Ambulatory Visit: Payer: Self-pay

## 2018-10-10 ENCOUNTER — Encounter: Payer: Self-pay | Admitting: Family Medicine

## 2018-10-10 ENCOUNTER — Ambulatory Visit (INDEPENDENT_AMBULATORY_CARE_PROVIDER_SITE_OTHER): Payer: Medicare Other | Admitting: Family Medicine

## 2018-10-10 VITALS — BP 128/78 | HR 96 | Temp 98.6°F | Resp 16 | Ht <= 58 in | Wt 103.0 lb

## 2018-10-10 DIAGNOSIS — E871 Hypo-osmolality and hyponatremia: Secondary | ICD-10-CM | POA: Diagnosis not present

## 2018-10-10 DIAGNOSIS — R059 Cough, unspecified: Secondary | ICD-10-CM

## 2018-10-10 DIAGNOSIS — R05 Cough: Secondary | ICD-10-CM

## 2018-10-10 DIAGNOSIS — I10 Essential (primary) hypertension: Secondary | ICD-10-CM | POA: Diagnosis not present

## 2018-10-10 MED ORDER — ALBUTEROL SULFATE HFA 108 (90 BASE) MCG/ACT IN AERS: 2.0000 | INHALATION_SPRAY | Freq: Four times a day (QID) | RESPIRATORY_TRACT | 0 refills | Status: DC | PRN

## 2018-10-10 MED ORDER — FAMOTIDINE 20 MG PO TABS: 20.0000 mg | ORAL_TABLET | Freq: Every day | ORAL | 6 refills | Status: DC

## 2018-10-10 NOTE — Progress Notes (Signed)
Chief Complaint  Patient presents with  . Cough    x2 mo worse at night medication has not been helping, med make her tired  . Hypertension    Was seen at the ER 10/05/2018 they discont. blood perssure med. They told her that was cousing her sympthpms    HPI Hypertension Hyponatremia Fatigue  She has been cough It is worse at night Went to the ER and was admitted for hyponatremia, constipation and generalized weakness She continues to cough despite being off lisiopril and hctz since 10/05/2018 when she was admitted for weakness She mostly coughs at night    She has been off the lisinopril and hctz She still coughs at night despite being off the lisinopril She is eating her normal meals  Past Medical History:  Diagnosis Date  . Degenerative disc disease   . Diabetes mellitus without complication ([place])   . Hypertension   . Pruritus     Current Outpatient Medications  Medication Sig Dispense Refill  . blood glucose meter kit and supplies KIT Dispense on insurance preference. Check cbg every morning before breakfast and when feeling tired.. DX E11.9, E11.649 1 each 3  . glimepiride (AMARYL) 1 MG tablet Take 0.5 tablets (0.5 mg total) by mouth daily with breakfast. 30 tablet 3  . omeprazole (PRILOSEC) 40 MG capsule Take 1 capsule (40 mg total) by mouth daily. 30 capsule 3  . albuterol (PROVENTIL HFA;VENTOLIN HFA) 108 (90 Base) MCG/ACT inhaler Inhale 2 puffs into the lungs every 6 (six) hours as needed for wheezing or shortness of breath. 1 Inhaler 0  . famotidine (PEPCID) 20 MG tablet Take 1 tablet (20 mg total) by mouth at bedtime. 30 tablet 6   No current facility-administered medications for this visit.     Allergies:  Allergies  Allergen Reactions  . Hydrochlorothiazide     Severe hyponatremia resulting in hospitalization  . Lisinopril     Past Surgical History:  Procedure Laterality Date  . ANTERIOR CERVICAL DECOMP/DISCECTOMY FUSION  02/2010   C3-6/notes  03/09/2010  . BACK SURGERY    . DILATION AND CURETTAGE, DIAGNOSTIC / THERAPEUTIC      Social History   Socioeconomic History  . Marital status: Widowed    Spouse name: Not on file  . Number of children: 1  . Years of education: Not on file  . Highest education level: Not on file  Occupational History  . Not on file  Social Needs  . Financial resource strain: Not on file  . Food insecurity:    Worry: Not on file    Inability: Not on file  . Transportation needs:    Medical: Not on file    Non-medical: Not on file  Tobacco Use  . Smoking status: Never Smoker  . Smokeless tobacco: Never Used  Substance and Sexual Activity  . Alcohol use: No  . Drug use: No  . Sexual activity: Not Currently    Birth control/protection: Abstinence  Lifestyle  . Physical activity:    Days per week: Not on file    Minutes per session: Not on file  . Stress: Not on file  Relationships  . Social connections:    Talks on phone: Not on file    Gets together: Not on file    Attends religious service: Not on file    Active member of club or organization: Not on file    Attends meetings of clubs or organizations: Not on file    Relationship status: Not on  file  Other Topics Concern  . Not on file  Social History Narrative  . Not on file    Family History  Family history unknown: Yes     ROS Review of Systems See HPI Constitution: No fevers or chills No malaise No diaphoresis Skin: No rash or itching Eyes: no blurry vision, no double vision GU: no dysuria or hematuria Neuro: no dizziness or headaches all others reviewed and negative   Objective: Vitals:   10/10/18 1211 10/10/18 1224  BP: (!) 162/73 128/78  Pulse: 96   Resp: 16   Temp: 98.6 F (37 C)   SpO2: 96%   Weight: 103 lb (46.7 kg)   Height: 1' (0.305 m)     Physical Exam  Constitutional: She is oriented to person, place, and time. She appears well-developed and well-nourished.  HENT:  Head: Normocephalic and  atraumatic.  Eyes: Conjunctivae and EOM are normal.  Cardiovascular: Normal rate, regular rhythm and normal heart sounds.  No murmur heard. Pulmonary/Chest: Effort normal and breath sounds normal. No stridor. No respiratory distress. She has no wheezes.  Neurological: She is alert and oriented to person, place, and time.  Skin: Skin is warm. Capillary refill takes less than 2 seconds.  Psychiatric: She has a normal mood and affect. Her behavior is normal. Judgment and thought content normal.    Assessment and Plan [place] was seen today for cough and hypertension.  Diagnoses and all orders for this visit:  Cough- will do a trial of albuterol at bedtime with pepcid at bedtime -     albuterol (PROVENTIL HFA;VENTOLIN HFA) 108 (90 Base) MCG/ACT inhaler; Inhale 2 puffs into the lungs every 6 (six) hours as needed for wheezing or shortness of breath.  Hyponatremia- will check Na -     Basic metabolic panel  HYPERTENSION, BENIGN ESSENTIAL- discussed bp goal, pt is at goal off meds She should continue to remain off bp meds  Other orders -     famotidine (PEPCID) 20 MG tablet; Take 1 tablet (20 mg total) by mouth at bedtime.     Robin Andrews

## 2018-10-10 NOTE — Patient Instructions (Signed)
° ° ° °  If you have lab work done today you will be contacted with your lab results within the next 2 weeks.  If you have not heard from us then please contact us. The fastest way to get your results is to register for My Chart. ° ° °IF you received an x-ray today, you will receive an invoice from [facility]. Please contact [facility] at [phone] with questions or concerns regarding your invoice.  ° °IF you received labwork today, you will receive an invoice from [facility]. Please contact [facility] at [phone] with questions or concerns regarding your invoice.  ° °Our billing staff will not be able to assist you with questions regarding bills from these companies. ° °You will be contacted with the lab results as soon as they are available. The fastest way to get your results is to activate your My Chart account. Instructions are located on the last page of this paperwork. If you have not heard from us regarding the results in 2 weeks, please contact this office. °  ° ° ° °

## 2018-10-11 LAB — BASIC METABOLIC PANEL
BUN / CREAT RATIO: 18 (ref 12–28)
BUN: 14 mg/dL (ref 8–27)
CO2: 18 mmol/L — ABNORMAL LOW (ref 20–29)
CREATININE: 0.78 mg/dL (ref 0.57–1.00)
Calcium: 8.4 mg/dL — ABNORMAL LOW (ref 8.7–10.3)
Chloride: 88 mmol/L — ABNORMAL LOW (ref 96–106)
GFR, EST AFRICAN AMERICAN: 80 mL/min/{1.73_m2} (ref >59)
GFR, EST NON AFRICAN AMERICAN: 70 mL/min/{1.73_m2} (ref >59)
Glucose: 254 mg/dL — ABNORMAL HIGH (ref 65–99)
Potassium: 3.9 mmol/L (ref 3.5–5.2)
SODIUM: 122 mmol/L — AB (ref 134–144)

## 2018-10-12 ENCOUNTER — Telehealth: Payer: Self-pay | Admitting: *Deleted

## 2018-10-12 NOTE — Telephone Encounter (Signed)
Patients husband called wanted an appointment today.  Was advised we can schedule an appointment but do not have anything today.  If it is an emergency he needs to go to ER.  Stated wife BP was 178/82 with no other symptoms.   Patient wanted to be squeezed in today for emergency her advised we do not have anything and he should proceed to urgent care.  Patient declined and wanted to schedule an appointment.   Was advised on symptoms and when to go to ER

## 2018-10-13 ENCOUNTER — Ambulatory Visit (INDEPENDENT_AMBULATORY_CARE_PROVIDER_SITE_OTHER): Payer: Medicare Other

## 2018-10-13 ENCOUNTER — Other Ambulatory Visit: Payer: Self-pay | Admitting: Physician Assistant

## 2018-10-13 ENCOUNTER — Encounter: Payer: Self-pay | Admitting: Physician Assistant

## 2018-10-13 ENCOUNTER — Other Ambulatory Visit: Payer: Self-pay

## 2018-10-13 ENCOUNTER — Ambulatory Visit (INDEPENDENT_AMBULATORY_CARE_PROVIDER_SITE_OTHER): Payer: Medicare Other | Admitting: Physician Assistant

## 2018-10-13 VITALS — BP 171/79 | HR 79 | Temp 98.8°F | Resp 18 | Ht 59.21 in | Wt 104.6 lb

## 2018-10-13 DIAGNOSIS — R5383 Other fatigue: Secondary | ICD-10-CM

## 2018-10-13 DIAGNOSIS — I10 Essential (primary) hypertension: Secondary | ICD-10-CM

## 2018-10-13 DIAGNOSIS — R05 Cough: Secondary | ICD-10-CM | POA: Diagnosis not present

## 2018-10-13 DIAGNOSIS — E871 Hypo-osmolality and hyponatremia: Secondary | ICD-10-CM

## 2018-10-13 DIAGNOSIS — R0602 Shortness of breath: Secondary | ICD-10-CM | POA: Diagnosis not present

## 2018-10-13 LAB — POCT CBC
Granulocyte percent: 78.7 %G (ref 37–80)
HCT, POC: 35.9 % (ref 29–41)
Hemoglobin: 12.1 g/dL (ref 9.5–13.5)
Lymph, poc: 1.5 (ref 0.6–3.4)
MCH, POC: 29.5 pg (ref 27–31.2)
MCHC: 33.8 g/dL (ref 31.8–35.4)
MCV: 87.3 fL (ref 76–111)
MID (cbc): 0.7 (ref 0–0.9)
MPV: 6 fL (ref 0–99.8)
POC Granulocyte: 8 — AB (ref 2–6.9)
POC LYMPH PERCENT: 14.7 %L (ref 10–50)
POC MID %: 6.6 %M (ref 0–12)
Platelet Count, POC: 425 10*3/uL — AB (ref 142–424)
RBC: 4.12 M/uL (ref 4.04–5.48)
RDW, POC: 12.2 %
WBC: 10.2 10*3/uL (ref 4.6–10.2)

## 2018-10-13 LAB — POCT URINALYSIS DIP (MANUAL ENTRY)
Bilirubin, UA: NEGATIVE
Blood, UA: NEGATIVE
Ketones, POC UA: NEGATIVE mg/dL
LEUKOCYTES UA: NEGATIVE
NITRITE UA: NEGATIVE
Protein Ur, POC: >=300 mg/dL — AB
SPEC GRAV UA: 1.02 (ref 1.010–1.025)
UROBILINOGEN UA: 0.2 U/dL
pH, UA: 6.5 (ref 5.0–8.0)

## 2018-10-13 LAB — GLUCOSE, POCT (MANUAL RESULT ENTRY): POC Glucose: 148 mg/dl — AB (ref 70–99)

## 2018-10-13 MED ORDER — AMLODIPINE BESYLATE 2.5 MG PO TABS: 2.5000 mg | ORAL_TABLET | Freq: Every day | ORAL | 0 refills | Status: DC

## 2018-10-13 NOTE — Progress Notes (Signed)
MRN: [id number] DOB: 11-16-1931  Subjective:   Robin Andrews is a 84 y.o. female presenting for follow up on fatigue and HTN. Was last seen in office for this 3 days ago, on 10/10/18, by Dr. Nolon Rod. Tx with albuterol and pepcid trial at bedtime, which she has not been taking.  Prior to that was admitted to ED for weakness. They d/c BP meds and told her to eat a normal diet. Today, notes she has been eating normal and her bp is elevated. Checked bp yesterday and it was 191/94. Has associated lightheadedness and SOB.  Denies seizures, muscle weaknes, dizziness, chronic headache, double vision, chest pain,  heart racing, palpitations, nausea, vomiting, abdominal pain, hematuria, lower leg swelling, orthopnea, and DOE. Marland Kitchen Not checking sugars. No PMH of CHF. Denies any other aggravating or relieving factors, no other questions or concerns.  Robin Andrews has a current medication list which includes the following prescription(s): albuterol, blood glucose meter kit and supplies, famotidine, glimepiride, omeprazole, and amlodipine. Also is allergic to hydrochlorothiazide and lisinopril.  Robin Andrews  has a past medical history of Degenerative disc disease, Diabetes mellitus without complication ([place]), Hypertension, and Pruritus. Also  has a past surgical history that includes Dilation and curettage, diagnostic / therapeutic; Back surgery; and Anterior cervical decomp/discectomy fusion (02/2010).   Objective:   Vitals: BP (!) 171/79   Pulse 79   Temp 98.8 F (37.1 C) (Oral)   Resp 18   Ht 4' 11.21" (1.504 m)   Wt 104 lb 9.6 oz (47.4 kg)   SpO2 95%   BMI 20.97 kg/m   Physical Exam  Constitutional: She is oriented to person, place, and time. She appears well-developed and well-nourished.  Non-toxic appearance. She does not have a sickly appearance. She does not appear ill. No distress.  HENT:  Head: Normocephalic and atraumatic.  Eyes: Pupils are equal, round, and reactive to light. Conjunctivae and EOM are  normal.  Neck: Normal range of motion. No JVD present.  Cardiovascular: Normal rate, regular rhythm, normal heart sounds and intact distal pulses.  Pulmonary/Chest: Effort normal. No accessory muscle usage or stridor. No tachypnea.  Difficult lung exam due to pt making audible noises when breathing, only doing this during lung exam, otherwise, breathing normally.   Abdominal: Soft. Normal appearance and bowel sounds are normal. There is no tenderness.  Musculoskeletal:       Right lower leg: She exhibits no edema.       Left lower leg: She exhibits no edema.  Neurological: She is alert and oriented to person, place, and time. She has normal strength. No cranial nerve deficit or sensory deficit.  Skin: Skin is warm and dry.  Psychiatric: She has a normal mood and affect.  Vitals reviewed.   Results for orders placed or performed in visit on 10/13/18 (from the past 24 hour(s))  POCT glucose (manual entry)     Status: Abnormal   Collection Time: 10/13/18  1:16 PM  Result Value Ref Range   POC Glucose 148 (A) 70 - 99 mg/dl  POCT CBC     Status: Abnormal   Collection Time: 10/13/18  1:16 PM  Result Value Ref Range   WBC 10.2 4.6 - 10.2 K/uL   Lymph, poc 1.5 0.6 - 3.4   POC LYMPH PERCENT 14.7 10 - 50 %L   MID (cbc) 0.7 0 - 0.9   POC MID % 6.6 0 - 12 %M   POC Granulocyte 8.0 (A) 2 - 6.9  Granulocyte percent 78.7 37 - 80 %G   RBC 4.12 4.04 - 5.48 M/uL   Hemoglobin 12.1 9.5 - 13.5 g/dL   HCT, POC 35.9 29 - 41 %   MCV 87.3 76 - 111 fL   MCH, POC 29.5 27 - 31.2 pg   MCHC 33.8 31.8 - 35.4 g/dL   RDW, POC 12.2 %   Platelet Count, POC 425 (A) 142 - 424 K/uL   MPV 6.0 0 - 99.8 fL  POCT urinalysis dipstick     Status: Abnormal   Collection Time: 10/13/18  1:17 PM  Result Value Ref Range   Color, UA yellow yellow   Clarity, UA clear clear   Glucose, UA =100 (A) negative mg/dL   Bilirubin, UA negative negative   Ketones, POC UA negative negative mg/dL   Spec Grav, UA 1.020 1.010 - 1.025    Blood, UA negative negative   pH, UA 6.5 5.0 - 8.0   Protein Ur, POC >=300 (A) negative mg/dL   Urobilinogen, UA 0.2 0.2 or 1.0 E.U./dL   Nitrite, UA Negative Negative   Leukocytes, UA Negative Negative   Dg Chest 2 View  Result Date: 10/13/2018 CLINICAL DATA:  Chronic shortness of breath and cough. EXAM: CHEST - 2 VIEW COMPARISON:  09/26/2018 and prior radiographs FINDINGS: UPPER limits normal heart size and mild peribronchial thickening again noted. There is no evidence of focal airspace disease, pulmonary edema, suspicious pulmonary nodule/mass, pleural effusion, or pneumothorax. No acute bony abnormalities are identified. IMPRESSION: No evidence of acute cardiopulmonary disease. UPPER limits normal heart size and mild chronic peribronchial thickening. Electronically Signed   By: Margarette Canada M.D.   On: 10/13/2018 13:26     BP Readings from Last 3 Encounters:  10/13/18 (!) 171/79  10/10/18 128/78  10/06/18 (!) 156/59   EKG shows NSR with rate of 75 bpm. PR and QRS intervals within normal limits. No acute ST or T wave abnormalities. Findings presented and discussed with Dr. Mitchel Honour.    Assessment and Plan :  This case was precepted with Dr. Mitchel Honour. 1. Other fatigue Patient is overall well-appearing, no acute distress.  No acute findings on physical exam.  Lungs CTA B.  Chest x-ray with upper limit normal heart size and mild peribronchial thickening.  EKG normal.  BP elevated.  Suggest obtaining labs today, starting blood pressure medication, and close follow-up in 2 days with strict ED precautions.  Given Rx for amlodipine 2.5 mg.  Due to recent severe hyponatremia and reported allergy to lisinopril, will avoid ACE/ARB/thiazides.  Has history of medical noncompliance with medication use.  Suggested she start Pepcid and inhalers as advised to at last visit.  - EKG 12-Lead - POCT glucose (manual entry) - DG Chest 2 View; Future - POCT urinalysis dipstick - POCT CBC - Brain  natriuretic peptide  2. Hyponatremia Labs pending.  - CMP14+EGFR  3. HYPERTENSION, BENIGN ESSENTIAL Uncontrolled.  Has fatigue, shortness of breath, lightheadedness.  These are not acute issues but have been persisting since hospital admission.  Was given trial of albuterol inhaler and Pepcid 3 days ago, which she has not started.  Sodium levels are improving.  Chest x-ray and EKG as above.  WBC and glucose normal.  Does have glucose and proteinuria.  Patient is noncompliant with all medications.  Will refer to cardiology for further work-up of potential CHF.  No signs of fluid overload today.  BMP pending. - Ambulatory referral to Cardiology - amLODipine (NORVASC) 2.5 MG tablet; Take 1  tablet (2.5 mg total) by mouth daily.  Dispense: 30 tablet; Refill: 0  Side effects, risks, benefits, and alternatives of the medications and treatment plan prescribed today were discussed, and patient expressed understanding of the instructions given. No barriers to understanding were identified. Red flags discussed in detail. Pt expressed understanding regarding what to do in case of emergency/urgent symptoms.  Tenna Delaine, PA-C  [facility] 10/13/2018 1:35 PM

## 2018-10-13 NOTE — Patient Instructions (Addendum)
Start amlodipine 2.5mg  daily. Monitor bp. Goal is <140/90. If you start to have chest pain, blurred vision, worsening shortness of breath, severe headache, lower leg swelling, or nausea/vomiting please seek care immediately here or at the ED.   We have referred you to cardiology for further monitoring of blood pressure.   Start albuterol inhaler and pepcid at night time.   We have checked labs today.   Follow up here in 2 days.   If you have lab work done today you will be contacted with your lab results within the next 2 weeks.  If you have not heard from [place] then please contact us. The fastest way to get your results is to register for My Chart.   IF you received an x-ray today, you will receive an invoice from [facility]. Please contact [facility] at [phone] with questions or concerns regarding your invoice.   IF you received labwork today, you will receive an invoice from [place]. Please contact [facility] at [phone] with questions or concerns regarding your invoice.   Our billing staff will not be able to assist you with questions regarding bills from these companies.  You will be contacted with the lab results as soon as they are available. The fastest way to get your results is to activate your My Chart account. Instructions are located on the last page of this paperwork. If you have not heard from [place] regarding the results in 2 weeks, please contact this office.

## 2018-10-14 LAB — CMP14+EGFR
A/G RATIO: 1.7 (ref 1.2–2.2)
ALK PHOS: 72 IU/L (ref 39–117)
ALT: 23 IU/L (ref 0–32)
AST: 21 IU/L (ref 0–40)
Albumin: 4.3 g/dL (ref 3.5–4.7)
BUN / CREAT RATIO: 24 (ref 12–28)
BUN: 19 mg/dL (ref 8–27)
Bilirubin Total: 0.2 mg/dL (ref 0.0–1.2)
CO2: 24 mmol/L (ref 20–29)
Calcium: 9.4 mg/dL (ref 8.7–10.3)
Chloride: 92 mmol/L — ABNORMAL LOW (ref 96–106)
Creatinine, Ser: 0.78 mg/dL (ref 0.57–1.00)
GFR calc Af Amer: 80 mL/min/{1.73_m2} (ref >59)
GFR, EST NON AFRICAN AMERICAN: 70 mL/min/{1.73_m2} (ref >59)
GLOBULIN, TOTAL: 2.6 g/dL (ref 1.5–4.5)
Glucose: 146 mg/dL — ABNORMAL HIGH (ref 65–99)
POTASSIUM: 4 mmol/L (ref 3.5–5.2)
SODIUM: 132 mmol/L — AB (ref 134–144)
Total Protein: 6.9 g/dL (ref 6.0–8.5)

## 2018-10-14 LAB — BRAIN NATRIURETIC PEPTIDE: BNP: 40.1 pg/mL (ref 0.0–100.0)

## 2018-10-17 ENCOUNTER — Encounter: Payer: Self-pay | Admitting: Physician Assistant

## 2018-10-17 ENCOUNTER — Other Ambulatory Visit: Payer: Self-pay | Admitting: Physician Assistant

## 2018-10-17 ENCOUNTER — Other Ambulatory Visit: Payer: Self-pay

## 2018-10-17 ENCOUNTER — Ambulatory Visit (INDEPENDENT_AMBULATORY_CARE_PROVIDER_SITE_OTHER): Payer: Medicare Other | Admitting: Physician Assistant

## 2018-10-17 VITALS — BP 174/70 | HR 71 | Temp 97.7°F | Resp 18 | Ht 59.25 in | Wt 102.6 lb

## 2018-10-17 DIAGNOSIS — I10 Essential (primary) hypertension: Secondary | ICD-10-CM | POA: Diagnosis not present

## 2018-10-17 DIAGNOSIS — K5900 Constipation, unspecified: Secondary | ICD-10-CM | POA: Diagnosis not present

## 2018-10-17 MED ORDER — BLOOD PRESSURE KIT DEVI: 1.0000 | Freq: Every day | 0 refills | Status: DC

## 2018-10-17 MED ORDER — NIFEDIPINE ER OSMOTIC RELEASE 30 MG PO TB24: 30.0000 mg | ORAL_TABLET | Freq: Every day | ORAL | 0 refills | Status: DC

## 2018-10-17 NOTE — Progress Notes (Signed)
MRN: [id number] DOB: [birth year]  Subjective:   Robin Andrews is a 84 y.o. female presenting for follow up on Hypertension. Last seen on 10/24 for fatigue and elevated bp readings. Had been off bp meds because bp was running normal in hospital. BP was elevated in 170s. Started back on amlodipine 2.2m. Has not been checking bp outside the office. Today, reports she has been taking meds but it makes her constipated so she does not want to take it. She had issues with this in the past. Has also tried hctz and lisinopril in the past and did not like those either. Fatigue has since resolved. Denies lightheadedness, dizziness, chronic headache, double vision, chest pain, shortness of breath, heart racing, palpitations, nausea, vomiting, abdominal pain, hematuria, lower leg swelling.   [employer] vietnamese interpreter used.   Robin Andrews a current medication list which includes the following prescription(s): amlodipine, blood glucose meter kit and supplies, famotidine, albuterol, blood pressure kit, glimepiride, nifedipine, and omeprazole. Also is allergic to hydrochlorothiazide and lisinopril.  Robin Andrews has a past medical history of Degenerative disc disease, Diabetes mellitus without complication (Robin Andrews, Hypertension, and Pruritus. Also  has a past surgical history that includes Dilation and curettage, diagnostic / therapeutic; Back surgery; and Anterior cervical decomp/discectomy fusion (02/2010).   Objective:   Vitals: BP (!) 174/70   Pulse 71   Temp 97.7 F (36.5 C) (Oral)   Resp 18   Ht 4' 11.25" (1.505 m)   Wt 102 lb 9.6 oz (46.5 kg)   SpO2 98%   BMI 20.55 kg/m   Physical Exam  Constitutional: She is oriented to person, place, and time. She appears well-developed and well-nourished. No distress.  Well groomed.   HENT:  Head: Normocephalic and atraumatic.  Eyes: Conjunctivae are normal.  Neck: Normal range of motion.  Pulmonary/Chest: Effort normal.  Neurological: She is alert and  oriented to person, place, and time.  Skin: Skin is warm and dry.  Psychiatric: She has a normal mood and affect.  Vitals reviewed.    BP Readings from Last 3 Encounters:  10/17/18 (!) 174/70  10/13/18 (!) 171/79  10/10/18 128/78    No results found for this or any previous visit (from the past 24 hour(s)).  Assessment and Plan :  1. HYPERTENSION, BENIGN ESSENTIAL BP uncontrolled. Pt is asx today. CMP with sodium much improved since last visit. Normal BNP. Had lengthy discussion with patient regarding blood pressure control. Unlikely norvasc is reason for constipation as she was constipated before starting this medication. She is convinced it is the medication and will not keep taking. Can try another CCB at this time as she has allergy listed to lisinpril (thinks she had itching with it) and cannot tolerate hctz due to severe hyponatremia. Do not think BB would be sufficient to help with lowering bp to goal. Will attempt nifedipine. Given Rx for blood pressure monitor so she can check bp at home. Goal is <140/90. F/u in one week. Given ED precautions.   2. Constipation, unspecified constipation type Discussed lifestyle modifications. Rec OTC stool softener as well.    Meds ordered this encounter  Medications  . NIFEdipine (PROCARDIA-XL/NIFEDICAL-XL) 30 MG 24 hr tablet    Sig: Take 1 tablet (30 mg total) by mouth daily.    Dispense:  30 tablet    Refill:  0    Order Specific Question:   Supervising Provider    Answer:   SHorald Pollen[[id number] . Blood Pressure Monitoring (  BLOOD PRESSURE KIT) DEVI    Sig: 1 Device by Does not apply route daily.    Dispense:  1 Device    Refill:  0    Order Specific Question:   Supervising Provider    Answer:   Horald Pollen [[id number]]    Robin Delaine, PA-C  [facility] 10/17/2018 1:58 PM

## 2018-10-17 NOTE — Patient Instructions (Addendum)
  Stop amlodipine. Start nifedipine.  Check bp outside the office.  Return in one week for reevaluation.  May need to start over the counter daily stool softener.   If you start to have chest pain, blurred vision, shortness of breath, severe headache, lower leg swelling, or nausea/vomiting please seek care immediately here or at the ED.     If you have lab work done today you will be contacted with your lab results within the next 2 weeks.  If you have not heard from [place] then please contact us. The fastest way to get your results is to register for My Chart.   IF you received an x-ray today, you will receive an invoice from [facility]. Please contact [facility] at [phone] with questions or concerns regarding your invoice.   IF you received labwork today, you will receive an invoice from [facility]. Please contact [facility] at [phone] with questions or concerns regarding your invoice.   Our billing staff will not be able to assist you with questions regarding bills from these companies.  You will be contacted with the lab results as soon as they are available. The fastest way to get your results is to activate your My Chart account. Instructions are located on the last page of this paperwork. If you have not heard from [place] regarding the results in 2 weeks, please contact this office.

## 2018-10-25 ENCOUNTER — Other Ambulatory Visit: Payer: Self-pay

## 2018-10-25 ENCOUNTER — Ambulatory Visit (INDEPENDENT_AMBULATORY_CARE_PROVIDER_SITE_OTHER): Payer: Medicare Other | Admitting: Physician Assistant

## 2018-10-25 ENCOUNTER — Telehealth: Payer: Self-pay | Admitting: Family Medicine

## 2018-10-25 ENCOUNTER — Encounter: Payer: Self-pay | Admitting: Physician Assistant

## 2018-10-25 VITALS — BP 144/62 | HR 76 | Temp 97.8°F | Resp 20 | Ht 59.37 in | Wt 102.6 lb

## 2018-10-25 DIAGNOSIS — I10 Essential (primary) hypertension: Secondary | ICD-10-CM | POA: Diagnosis not present

## 2018-10-25 NOTE — Telephone Encounter (Signed)
This is not a true ABI. Will assess for any concerns for PAD at next visit

## 2018-10-25 NOTE — Telephone Encounter (Signed)
Copied from CRM [id number]. Topic: General - Other >> Oct 25, 2018  3:48 PM Jaquita Rector A wrote: Reason for CRM: [place]ie [place] nurse with [facility] Call[facility] called to inform that patient shows very mild PAD in her right foot. Left foot 0.92 and Right foot 1.27. Just wanted to inform

## 2018-10-25 NOTE — Patient Instructions (Addendum)
  Continue taking blood pressure medication every single day. Your blood pressure has improved a lot. Continue checking blood pressure at least 3 times per week. Goal is <140/90. Follow up in 2 weeks. Thank you for letting me participate in your health and well being.   If you have lab work done today you will be contacted with your lab results within the next 2 weeks.  If you have not heard from [place] then please contact us. The fastest way to get your results is to register for My Chart.   IF you received an x-ray today, you will receive an invoice from [facility]. Please contact [facility] at [phone] with questions or concerns regarding your invoice.   IF you received labwork today, you will receive an invoice from [facility]. Please contact [facility] at [phone] with questions or concerns regarding your invoice.   Our billing staff will not be able to assist you with questions regarding bills from these companies.  You will be contacted with the lab results as soon as they are available. The fastest way to get your results is to activate your My Chart account. Instructions are located on the last page of this paperwork. If you have not heard from [place] regarding the results in 2 weeks, please contact this office.

## 2018-10-25 NOTE — Telephone Encounter (Signed)
pls see note. thanks 

## 2018-10-25 NOTE — Progress Notes (Signed)
     MRN: [id number] DOB: 24-Jul-1931  Subjective:   Robin Andrews is a 84 y.o. female presenting for follow up on Hypertension.  Last seen in office 10/17/18. BP elevated at 172/70. Medications changed. Currently managed with nifedipine 30 mg XR. Patient is checking blood pressure at home, range is 725D-664Q systolic. Tolerating medication well. Constipation has improved.  Denies lightheadedness, dizziness, chronic headache, double vision, chest pain, shortness of breath, heart racing, palpitations, nausea, vomiting, abdominal pain, hematuria, lower leg swelling. Denies any other aggravating or relieving factors, no other questions or concerns.  Robin Andrews has a current medication list which includes the following prescription(s): albuterol, benzonatate, blood glucose meter kit and supplies, blood pressure kit, famotidine, amlodipine, glimepiride, nifedipine, and omeprazole. Also is allergic to hydrochlorothiazide and lisinopril.  Robin Andrews  has a past medical history of Degenerative disc disease, Diabetes mellitus without complication ([place]), Hypertension, and Pruritus. Also  has a past surgical history that includes Dilation and curettage, diagnostic / therapeutic; Back surgery; and Anterior cervical decomp/discectomy fusion (02/2010).   Objective:   Vitals: BP (!) 142/69   Pulse 76   Temp 97.8 F (36.6 C) (Oral)   Resp 20   Ht 4' 11.37" (1.508 m)   Wt 102 lb 9.6 oz (46.5 kg)   SpO2 97%   BMI 20.47 kg/m   Physical Exam  Constitutional: She is oriented to person, place, and time. She appears well-developed and well-nourished. No distress.  HENT:  Head: Normocephalic and atraumatic.  Mouth/Throat: Uvula is midline, oropharynx is clear and moist and mucous membranes are normal.  Eyes: Pupils are equal, round, and reactive to light. Conjunctivae and EOM are normal.  Neck: Normal range of motion.  Cardiovascular: Normal rate, regular rhythm, normal heart sounds and intact distal pulses.    Pulmonary/Chest: Effort normal and breath sounds normal. She has no wheezes. She has no rhonchi. She has no rales.  Musculoskeletal:       Right lower leg: She exhibits no swelling.       Left lower leg: She exhibits no swelling.  Neurological: She is alert and oriented to person, place, and time.  Skin: Skin is warm and dry.  Psychiatric: She has a normal mood and affect.  Vitals reviewed.   No results found for this or any previous visit (from the past 24 hour(s)).  BP Readings from Last 3 Encounters:  10/25/18 (!) 142/69  10/17/18 (!) 174/70  10/13/18 (!) 171/79    Assessment and Plan :  1. HYPERTENSION, BENIGN ESSENTIAL BP much improved since last visit.  Patient tolerating medication well and is actually taking medication as prescribed.  Suggest continuing with this dose for the time being and following up in 2 weeks for reevaluation.  May need to increase dose at that visit but do not want to increase too quickly as patient has had low blood pressure readings in the past with use of blood pressure medication and then is hesitant to take blood pressure medication at all.  Plan to repeat CMP and urinalysis at follow-up visit.  Tenna Delaine, PA-C  [facility] 10/25/2018 9:57 AM

## 2018-11-08 ENCOUNTER — Encounter: Payer: Self-pay | Admitting: Physician Assistant

## 2018-11-08 ENCOUNTER — Other Ambulatory Visit: Payer: Self-pay

## 2018-11-08 ENCOUNTER — Ambulatory Visit (INDEPENDENT_AMBULATORY_CARE_PROVIDER_SITE_OTHER): Payer: Medicare Other | Admitting: Physician Assistant

## 2018-11-08 VITALS — BP 120/60 | HR 77 | Temp 98.5°F | Resp 12 | Ht 59.0 in | Wt 104.4 lb

## 2018-11-08 DIAGNOSIS — I10 Essential (primary) hypertension: Secondary | ICD-10-CM | POA: Diagnosis not present

## 2018-11-08 LAB — POCT URINALYSIS DIP (MANUAL ENTRY)
BILIRUBIN UA: NEGATIVE mg/dL
Bilirubin, UA: NEGATIVE
Glucose, UA: 100 mg/dL — AB
Leukocytes, UA: NEGATIVE
Nitrite, UA: NEGATIVE
Protein Ur, POC: 100 mg/dL — AB
RBC UA: NEGATIVE
SPEC GRAV UA: 1.015 (ref 1.010–1.025)
UROBILINOGEN UA: 0.2 U/dL
pH, UA: 6.5 (ref 5.0–8.0)

## 2018-11-08 MED ORDER — NIFEDIPINE ER OSMOTIC RELEASE 30 MG PO TB24: 30.0000 mg | ORAL_TABLET | Freq: Every day | ORAL | 1 refills | Status: DC

## 2018-11-08 NOTE — Progress Notes (Signed)
     MRN: [id number] DOB: 1931/01/12  Subjective:   Robin Andrews is a 84 y.o. female presenting for follow up on Hypertension. Currently managed with nifedipine '30mg'$  daily. Patient is checking blood pressure at home, range is 336P systolic. Did not bring bp cuff to office. Denies lightheadedness, dizziness, chronic headache, double vision, chest pain, shortness of breath, heart racing, palpitations, nausea, vomiting, abdominal pain, hematuria, lower leg swelling. Denies any other aggravating or relieving factors, no other questions or concerns.  Robin Andrews has a current medication list which includes the following prescription(s): albuterol, benzonatate, blood glucose meter kit and supplies, blood pressure kit, famotidine, glimepiride, nifedipine, and omeprazole. Also is allergic to hydrochlorothiazide and lisinopril.  Robin Andrews  has a past medical history of Degenerative disc disease, Diabetes mellitus without complication ([place]), Hypertension, and Pruritus. Also  has a past surgical history that includes Dilation and curettage, diagnostic / therapeutic; Back surgery; and Anterior cervical decomp/discectomy fusion (02/2010).   Objective:   Vitals: BP 120/60 (BP Location: Right Arm, Cuff Size: Normal)   Pulse 77   Temp 98.5 F (36.9 C) (Oral)   Resp 12   Ht '4\' 11"'$  (1.499 m)   Wt 104 lb 6.4 oz (47.4 kg)   SpO2 97%   BMI 21.09 kg/m   Physical Exam  Constitutional: She is oriented to person, place, and time. She appears well-developed and well-nourished.  HENT:  Head: Normocephalic and atraumatic.  Eyes: Conjunctivae are normal.  Neck: Normal range of motion.  Pulmonary/Chest: Effort normal.  Neurological: She is alert and oriented to person, place, and time.  Skin: Skin is warm and dry.  Psychiatric: She has a normal mood and affect.  Vitals reviewed.   No results found for this or any previous visit (from the past 24 hour(s)).  BP Readings from Last 3 Encounters:  11/08/18 120/60    10/25/18 (!) 144/62  10/17/18 (!) 174/70    Assessment and Plan :  1. HYPERTENSION, BENIGN ESSENTIAL Both BP readings in the office today were within normal limits.  Discussed with patient and boyfriend that I am concerned her home blood pressure monitor is not reading appropriate values.  They need to bring thier blood pressure cuff to the office and have it compared to our in office readings.  Educated them that they can do this anytime.  As of now, recommend she stay on current dose of blood pressure medication because I do not want her values to go too low.  They voiced their understanding.  - POCT urinalysis dipstick  Meds ordered this encounter  Medications  . NIFEdipine (PROCARDIA-XL/NIFEDICAL-XL) 30 MG 24 hr tablet    Sig: Take 1 tablet (30 mg total) by mouth daily.    Dispense:  90 tablet    Refill:  1    Order Specific Question:   Supervising Provider    Answer:   Horald Pollen [[id number]]    Tenna Delaine, PA-C  [facility] [place] 11/10/2018 3:43 PM

## 2018-11-08 NOTE — Patient Instructions (Addendum)
   Your blood pressure is well controlled in the office, which is reassuring.  Please return in 2 weeks with blood pressure cuff so we can compare to our in office values.  You do not need an office visit for this we are just going to compare your blood pressure cuff.  I have given you refills for medication.  Thank you for letting me participate in your health and well being.   If you have lab work done today you will be contacted with your lab results within the next 2 weeks.  If you have not heard from us then please contact us. The fastest way to get your results is to register for My Chart.   IF you received an x-ray today, you will receive an invoice from [facility]. Please contact [facility] at [phone] with questions or concerns regarding your invoice.   IF you received labwork today, you will receive an invoice from [facility]. Please contact [facility] at [phone] with questions or concerns regarding your invoice.   Our billing staff will not be able to assist you with questions regarding bills from these companies.  You will be contacted with the lab results as soon as they are available. The fastest way to get your results is to activate your My Chart account. Instructions are located on the last page of this paperwork. If you have not heard from us regarding the results in 2 weeks, please contact this office.

## 2018-11-10 ENCOUNTER — Telehealth: Payer: Self-pay | Admitting: Physician Assistant

## 2018-11-10 NOTE — Telephone Encounter (Signed)
Please call patient.  Her urine still has protein and glucose and then.  She will need to follow-up for reevaluation of diabetes.  She should schedule this appointment in the next month.

## 2018-11-10 NOTE — Telephone Encounter (Signed)
Spoke with [place]namese interpreter, pt not available for call, no voicemail is set up.

## 2018-11-22 ENCOUNTER — Ambulatory Visit: Payer: Medicare Other

## 2018-11-22 ENCOUNTER — Encounter: Payer: Self-pay | Admitting: *Deleted

## 2018-11-22 NOTE — Telephone Encounter (Signed)
Unsuccessful in reaching pt by phone, for home and cell. Letter sent.

## 2018-11-28 ENCOUNTER — Ambulatory Visit: Payer: Self-pay | Admitting: *Deleted

## 2018-11-28 NOTE — Telephone Encounter (Signed)
  Reason for Disposition . [1] Systolic BP  >= 130 OR Diastolic >= 80 AND [2] taking BP medications  Answer Assessment - Initial Assessment Questions 1. BLOOD PRESSURE: "What is the blood pressure?" "Did you take at least two measurements 5 minutes apart?"     Probably yesterday according to the caller.  2. ONSET: "When did you take your blood pressure?"     138/82 3. HOW: "How did you obtain the blood pressure?" (e.g., visiting nurse, automatic home BP monitor)     Home monitor 4. HISTORY: "Do you have a history of high blood pressure?"     yes 5. MEDICATIONS: "Are you taking any medications for blood pressure?" "Have you missed any doses recently?"     Taking medication as prescribed.  6. OTHER SYMPTOMS: "Do you have any symptoms?" (e.g., headache, chest pain, blurred vision, difficulty breathing, weakness)     No 7. PREGNANCY: "Is there any chance you are pregnant?" "When was your last menstrual period?"     no  Protocols used: HIGH BLOOD PRESSURE-A-AH

## 2018-12-30 ENCOUNTER — Ambulatory Visit: Payer: Medicare Other | Admitting: Family Medicine

## 2019-01-12 ENCOUNTER — Other Ambulatory Visit: Payer: Self-pay | Admitting: Family Medicine

## 2019-01-12 DIAGNOSIS — R05 Cough: Secondary | ICD-10-CM

## 2019-01-12 DIAGNOSIS — E119 Type 2 diabetes mellitus without complications: Secondary | ICD-10-CM

## 2019-01-12 DIAGNOSIS — R059 Cough, unspecified: Secondary | ICD-10-CM

## 2019-01-12 MED ORDER — GLIMEPIRIDE 1 MG PO TABS: 0.5000 mg | ORAL_TABLET | Freq: Every day | ORAL | 0 refills | Status: DC

## 2019-01-12 MED ORDER — FAMOTIDINE 20 MG PO TABS: 20.0000 mg | ORAL_TABLET | Freq: Every day | ORAL | 0 refills | Status: DC

## 2019-01-12 MED ORDER — ALBUTEROL SULFATE HFA 108 (90 BASE) MCG/ACT IN AERS: 2.0000 | INHALATION_SPRAY | Freq: Four times a day (QID) | RESPIRATORY_TRACT | 0 refills | Status: DC | PRN

## 2019-01-12 MED ORDER — NIFEDIPINE ER OSMOTIC RELEASE 30 MG PO TB24: 30.0000 mg | ORAL_TABLET | Freq: Every day | ORAL | 0 refills | Status: DC

## 2019-01-12 MED ORDER — OMEPRAZOLE 40 MG PO CPDR: 40.0000 mg | DELAYED_RELEASE_CAPSULE | Freq: Every day | ORAL | 0 refills | Status: DC

## 2019-01-12 MED ORDER — BLOOD GLUCOSE MONITOR KIT: PACK | 3 refills | Status: DC

## 2019-01-12 NOTE — Telephone Encounter (Signed)
Copied from [place] [id number]. Topic: Quick Communication - Rx Refill/Question >> Jan 12, 2019 11:24 AM Celedonio Savage L wrote: Medication: albuterol (PROVENTIL HFA;VENTOLIN HFA) 108 (90 Base) MCG/ACT inhaler    benzonatate (TESSALON) 100 MG capsule   blood glucose meter kit and supplies KIT    famotidine (PEPCID) 20 MG tablet   glimepiride (AMARYL) 1 MG tablet   NIFEdipine (PROCARDIA-XL/NIFEDICAL-XL) 30 MG 24 hr tablet   omeprazole (PRILOSEC) 40 MG capsule   Please fill until appt on February 4,2020    Has the patient contacted their pharmacy? No. Hard for pt to understand what I was saying (Agent: If no, request that the patient contact the pharmacy for the refill.) (Agent: If yes, when and what did the pharmacy advise?)  Preferred Pharmacy (with phone number or street name):   Agent: Please be advised that RX refills may take up to [place], [place] [phone] (Phone) [phone] (Fax)  usiness days. We ask that you follow-up with your pharmacy.

## 2019-01-12 NOTE — Telephone Encounter (Signed)
Requested medication (s) are due for refill today: yes  Requested medication (s) are on the active medication list: yes    Last refill: 10/25/18  Future visit scheduled yes 02/22/2019 Dr. Pamella Pert  Notes to clinic:historical provider  Requested Prescriptions  Pending Prescriptions Disp Refills   benzonatate (TESSALON) 100 MG capsule 20 capsule     Sig: Take 1 capsule (100 mg total) by mouth 3 (three) times daily as needed for cough.     Ear, Nose, and Throat:  Antitussives/Expectorants Passed - 01/12/2019 11:33 AM      Passed - Valid encounter within last 12 months    Recent Outpatient Visits          2 months ago HYPERTENSION, BENIGN ESSENTIAL   Primary Care at [place], [place] D, PA-C   2 months ago HYPERTENSION, BENIGN ESSENTIAL   Primary Care at [place], [place] D, PA-C   2 months ago HYPERTENSION, BENIGN ESSENTIAL   Primary Care at [place], [place] D, PA-C   3 months ago Other fatigue   Primary Care at [facility], [place] D, PA-C   3 months ago Cough   Primary Care at [facility], Arlie Solomons, MD      Future Appointments            In 1 month Rutherford Guys, MD Primary Care at [place], [facility]         Signed Prescriptions Disp Refills   albuterol (PROVENTIL HFA;VENTOLIN HFA) 108 (90 Base) MCG/ACT inhaler 1 Inhaler 0    Sig: Inhale 2 puffs into the lungs every 6 (six) hours as needed for wheezing or shortness of breath.     Pulmonology:  Beta Agonists Failed - 01/12/2019 11:33 AM      Failed - One inhaler should last at least one month. If the patient is requesting refills earlier, contact the patient to check for uncontrolled symptoms.      Passed - Valid encounter within last 12 months    Recent Outpatient Visits          2 months ago HYPERTENSION, BENIGN ESSENTIAL   Primary Care at [place], [place] D, PA-C   2 months ago HYPERTENSION, BENIGN ESSENTIAL   Primary Care at [place], [place] D, PA-C   2 months ago  HYPERTENSION, BENIGN ESSENTIAL   Primary Care at [place], [place] D, PA-C   3 months ago Other fatigue   Primary Care at [facility], [place] D, PA-C   3 months ago Cough   Primary Care at [facility], Arlie Solomons, MD      Future Appointments            In 1 month Pamella Pert, Lilia Argue, MD Primary Care at [place], [facility]          blood glucose meter kit and supplies KIT 1 each 3    Sig: Dispense on insurance preference. Check cbg every morning before breakfast and when feeling tired.. DX E11.9, E11.649     Endocrinology: Diabetes - Testing Supplies Passed - 01/12/2019 11:33 AM      Passed - Valid encounter within last 12 months    Recent Outpatient Visits          2 months ago HYPERTENSION, BENIGN ESSENTIAL   Primary Care at [place], [place] D, PA-C   2 months ago HYPERTENSION, BENIGN ESSENTIAL   Primary Care at [place], [place] D, PA-C   2 months ago HYPERTENSION, BENIGN ESSENTIAL   Primary Care at [facility]  Tenna Delaine D, PA-C   3 months ago Other fatigue   Primary Care at [facility], [place] D, PA-C   3 months ago Cough   Primary Care at [facility], Arlie Solomons, MD      Future Appointments            In 1 month Pamella Pert, Lilia Argue, MD Primary Care at [place], [facility]          famotidine (PEPCID) 20 MG tablet 90 tablet 0    Sig: Take 1 tablet (20 mg total) by mouth at bedtime.     Gastroenterology:  H2 Antagonists Passed - 01/12/2019 11:33 AM      Passed - Valid encounter within last 12 months    Recent Outpatient Visits          2 months ago HYPERTENSION, BENIGN ESSENTIAL   Primary Care at [place], [place] D, PA-C   2 months ago HYPERTENSION, BENIGN ESSENTIAL   Primary Care at [place], [place] D, PA-C   2 months ago HYPERTENSION, BENIGN ESSENTIAL   Primary Care at [place], [place] D, PA-C   3 months ago Other fatigue   Primary Care at [facility], [place] D, PA-C   3 months ago Cough   Primary Care at [facility], Arlie Solomons, MD      Future Appointments            In 1 month Pamella Pert, Lilia Argue, MD Primary Care at [place], [place]          glimepiride (AMARYL) 1 MG tablet 90 tablet 0    Sig: Take 0.5 tablets (0.5 mg total) by mouth daily with breakfast.     Endocrinology:  Diabetes - Sulfonylureas Passed - 01/12/2019 11:33 AM      Passed - HBA1C is between 0 and 7.9 and within 180 days    Hemoglobin A1C  Date Value Ref Range Status  08/02/2018 5.4 4.0 - 5.6 % Final   Hgb A1c MFr Bld  Date Value Ref Range Status  07/02/2017 5.8 (H) 4.8 - 5.6 % Final    Comment:             Pre-diabetes: 5.7 - 6.4          Diabetes: >6.4          Glycemic control for adults with diabetes: <7.0          Passed - Valid encounter within last 6 months    Recent Outpatient Visits          2 months ago HYPERTENSION, BENIGN ESSENTIAL   Primary Care at [place], [place] D, PA-C   2 months ago HYPERTENSION, BENIGN ESSENTIAL   Primary Care at [place], [place] D, PA-C   2 months ago HYPERTENSION, BENIGN ESSENTIAL   Primary Care at [place], [place] D, PA-C   3 months ago Other fatigue   Primary Care at [place], [place] D, PA-C   3 months ago Cough   Primary Care at [facility], Arlie Solomons, MD      Future Appointments            In 1 month Pamella Pert, Lilia Argue, MD Primary Care at [place], [facility]          NIFEdipine (PROCARDIA-XL/NIFEDICAL-XL) 30 MG 24 hr tablet 90 tablet 0    Sig: Take 1 tablet (30 mg total) by mouth daily.     Cardiovascular:  Calcium Channel Blockers Passed - 01/12/2019 11:33 AM  Passed - Last BP in normal range    BP Readings from Last 1 Encounters:  11/08/18 120/60         Passed - Valid encounter within last 6 months    Recent Outpatient Visits          2 months ago HYPERTENSION, BENIGN ESSENTIAL   Primary Care at [place], [place] D, PA-C   2 months ago HYPERTENSION, BENIGN ESSENTIAL   Primary Care at [place], [place] D, PA-C   2  months ago HYPERTENSION, BENIGN ESSENTIAL   Primary Care at [place], [place] D, PA-C   3 months ago Other fatigue   Primary Care at [facility], [place] D, PA-C   3 months ago Cough   Primary Care at [facility], Arlie Solomons, MD      Future Appointments            In 1 month Pamella Pert, Lilia Argue, MD Primary Care at [place], [facility]          omeprazole (PRILOSEC) 40 MG capsule 90 capsule 0    Sig: Take 1 capsule (40 mg total) by mouth daily.     Gastroenterology: Proton Pump Inhibitors Passed - 01/12/2019 11:33 AM      Passed - Valid encounter within last 12 months    Recent Outpatient Visits          2 months ago HYPERTENSION, BENIGN ESSENTIAL   Primary Care at [place], [place] D, PA-C   2 months ago HYPERTENSION, BENIGN ESSENTIAL   Primary Care at [place], [place] D, PA-C   2 months ago HYPERTENSION, BENIGN ESSENTIAL   Primary Care at [place], [place] D, PA-C   3 months ago Other fatigue   Primary Care at [facility], [place] D, PA-C   3 months ago Cough   Primary Care at [facility], Arlie Solomons, MD      Future Appointments            In 1 month Pamella Pert, Lilia Argue, MD Primary Care at [place], [facility]

## 2019-01-26 ENCOUNTER — Telehealth: Payer: Self-pay | Admitting: Family Medicine

## 2019-01-26 NOTE — Telephone Encounter (Signed)
Copied from CRM [id number]. Topic: Quick Communication - See Telephone Encounter >> Jan 26, 2019  9:38 AM Jolayne Haines L wrote: CRM for notification. See Telephone encounter for: 01/26/19.  Patient's friend, Stephani Police states that she is having stomach issues and acid reflux from her blood pressure medication ( he does not name and she does not speak english ). She is asking for something else since the prilosec is not helping with that. Please contact him @ [phone] ( I had to get a [place] ) he is on the [place].

## 2019-01-26 NOTE — Telephone Encounter (Signed)
Pt. Reports Prilosec is not helping her acid reflux and requests something different. Please advise.

## 2019-01-28 NOTE — Telephone Encounter (Signed)
Please advise 

## 2019-01-30 MED ORDER — PANTOPRAZOLE SODIUM 40 MG PO TBEC: 40.0000 mg | DELAYED_RELEASE_TABLET | Freq: Every day | ORAL | 0 refills | Status: DC

## 2019-02-03 ENCOUNTER — Ambulatory Visit: Payer: Medicare Other | Admitting: Family Medicine

## 2019-02-14 ENCOUNTER — Ambulatory Visit: Payer: Medicare Other | Admitting: Family Medicine

## 2019-02-22 ENCOUNTER — Encounter: Payer: Self-pay | Admitting: Family Medicine

## 2019-02-22 ENCOUNTER — Ambulatory Visit (INDEPENDENT_AMBULATORY_CARE_PROVIDER_SITE_OTHER): Payer: Medicare Other | Admitting: Family Medicine

## 2019-02-22 ENCOUNTER — Other Ambulatory Visit: Payer: Self-pay

## 2019-02-22 VITALS — BP 146/68 | HR 74 | Temp 98.2°F | Ht 59.0 in | Wt 100.2 lb

## 2019-02-22 DIAGNOSIS — E119 Type 2 diabetes mellitus without complications: Secondary | ICD-10-CM | POA: Diagnosis not present

## 2019-02-22 DIAGNOSIS — T887XXA Unspecified adverse effect of drug or medicament, initial encounter: Secondary | ICD-10-CM | POA: Diagnosis not present

## 2019-02-22 DIAGNOSIS — I10 Essential (primary) hypertension: Secondary | ICD-10-CM

## 2019-02-22 LAB — POCT GLYCOSYLATED HEMOGLOBIN (HGB A1C): Hemoglobin A1C: 4.9 % (ref 4.0–5.6)

## 2019-02-22 MED ORDER — GLIMEPIRIDE 1 MG PO TABS: 0.5000 mg | ORAL_TABLET | Freq: Every day | ORAL | 1 refills | Status: DC

## 2019-02-22 MED ORDER — LOSARTAN POTASSIUM 50 MG PO TABS: 50.0000 mg | ORAL_TABLET | Freq: Every day | ORAL | 1 refills | Status: DC

## 2019-02-22 NOTE — Progress Notes (Signed)
3/4/20209:26 AM  Robin Andrews 1931-05-15, 85 y.o. female [id number]  Chief Complaint  Patient presents with  . chronic condition    3 m f/u with Change of medication of nifedipine due to side effects  . Medication Refill    glimperide     HPI:   Patient is a 85 y.o. female with past medical history significant for DM2 and HTN who presents today for routine followup  [place] interpreter used Last OV NOV 2019 with [place], pa-c In October was having gerd with amlodipine therefore changed to HCTZ In nov not doing well and then to procardia She reports omeprazole caused nausea so has stopped taking it She reports that this medication is causing irritation in her throat and pressure in her chest and cough Checking BP at home, mostly every day, 140-150s Last nifedepine taken today She has not stopped checking medication Previous medications tried: enalapril, HCTZ, amlodipine, metoprolol, nifedepine: issues with constipation, hyponatremia and current sx She reports any previous issues with gerd, etc  She denies any hypoglycemia She is extremely reluctant to stop glimperide  Wt Readings from Last 3 Encounters:  02/22/19 100 lb 3.2 oz (45.5 kg)  11/08/18 104 lb 6.4 oz (47.4 kg)  10/25/18 102 lb 9.6 oz (46.5 kg)    Lab Results  Component Value Date   HGBA1C 4.9 02/22/2019   HGBA1C 5.4 08/02/2018   HGBA1C 5.5 12/18/2017   Lab Results  Component Value Date   LDLCALC 75 10/04/2018   CREATININE 0.78 10/13/2018    Fall Risk  02/22/2019 11/08/2018 10/25/2018 10/17/2018 10/13/2018  Falls in the past year? 0 0 0 No No  Number falls in past yr: 0 - - - -  Injury with Fall? 0 - - - -  Follow up Falls evaluation completed - - - -     Depression screen [facility] 2/9 02/22/2019 11/08/2018 10/17/2018  Decreased Interest 0 0 0  Down, Depressed, Hopeless 0 0 0  PHQ - 2 Score 0 0 0    Allergies  Allergen Reactions  . Hydrochlorothiazide     Severe hyponatremia resulting in  hospitalization  . Lisinopril     Prior to Admission medications   Medication Sig Start Date End Date Taking? Authorizing Provider  albuterol (PROVENTIL HFA;VENTOLIN HFA) 108 (90 Base) MCG/ACT inhaler Inhale 2 puffs into the lungs every 6 (six) hours as needed for wheezing or shortness of breath. 01/12/19  Yes Rutherford Guys, MD  Blood Pressure Monitoring (BLOOD PRESSURE KIT) DEVI 1 Device by Does not apply route daily. 10/17/18  Yes Timmothy Euler, [place] D, PA-C  glimepiride (AMARYL) 1 MG tablet Take 0.5 tablets (0.5 mg total) by mouth daily with breakfast. 01/12/19  Yes Rutherford Guys, MD  NIFEdipine (PROCARDIA-XL/NIFEDICAL-XL) 30 MG 24 hr tablet Take 1 tablet (30 mg total) by mouth daily. 01/12/19  Yes Rutherford Guys, MD    Past Medical History:  Diagnosis Date  . Degenerative disc disease   . Diabetes mellitus without complication ([place])   . Hypertension   . Pruritus     Past Surgical History:  Procedure Laterality Date  . ANTERIOR CERVICAL DECOMP/DISCECTOMY FUSION  02/2010   C3-6/notes 03/09/2010  . BACK SURGERY    . DILATION AND CURETTAGE, DIAGNOSTIC / THERAPEUTIC      Social History   Tobacco Use  . Smoking status: Never Smoker  . Smokeless tobacco: Never Used  Substance Use Topics  . Alcohol use: No    Family History  Family history unknown:  Yes    Review of Systems  Constitutional: Negative for chills and fever.  Respiratory: Negative for cough and shortness of breath.   Cardiovascular: Negative for chest pain, palpitations and leg swelling.  Gastrointestinal: Negative for abdominal pain, nausea and vomiting.  per hpi   OBJECTIVE:  Blood pressure (!) 146/68, pulse 74, temperature 98.2 F (36.8 C), temperature source Oral, height 4' 11"  (1.499 m), weight 100 lb 3.2 oz (45.5 kg), SpO2 98 %. Body mass index is 20.24 kg/m.   Physical Exam Vitals signs and nursing note reviewed.  Constitutional:      Appearance: She is well-developed.  HENT:     Head:  Normocephalic and atraumatic.     Mouth/Throat:     Pharynx: No oropharyngeal exudate.  Eyes:     General: No scleral icterus.    Conjunctiva/sclera: Conjunctivae normal.     Pupils: Pupils are equal, round, and reactive to light.  Neck:     Musculoskeletal: Neck supple.  Cardiovascular:     Rate and Rhythm: Normal rate and regular rhythm.     Heart sounds: Normal heart sounds. No murmur. No friction rub. No gallop.   Pulmonary:     Effort: Pulmonary effort is normal.     Breath sounds: Normal breath sounds. No wheezing or rales.  Skin:    General: Skin is warm and dry.  Neurological:     Mental Status: She is alert and oriented to person, place, and time.      Diabetic Foot Exam - Simple   Simple Foot Form Diabetic Foot exam was performed with the following findings:  Yes 02/22/2019  8:54 AM  Visual Inspection No deformities, no ulcerations, no other skin breakdown bilaterally:  Yes Sensation Testing Intact to touch and monofilament testing bilaterally:  Yes Pulse Check Posterior Tibialis and Dorsalis pulse intact bilaterally:  Yes Comments     Results for orders placed or performed in visit on 02/22/19 (from the past 24 hour(s))  POCT glycosylated hemoglobin (Hb A1C)     Status: None   Collection Time: 02/22/19  9:17 AM  Result Value Ref Range   Hemoglobin A1C 4.9 4.0 - 5.6 %   HbA1c POC (<> result, manual entry)     HbA1c, POC (prediabetic range)     HbA1c, POC (controlled diabetic range)       ASSESSMENT and PLAN  1. Controlled type 2 diabetes mellitus without complication, without long-term current use of insulin ([place]) Very well controlled. She denies hypoglycemia. She is vehement to continue taking her medication - HM DIABETES FOOT EXAM - POCT glycosylated hemoglobin (Hb A1C) - Lipid panel - CMP14+EGFR - glimepiride (AMARYL) 1 MG tablet; Take 0.5 tablets (0.5 mg total) by mouth daily with breakfast.  2. HYPERTENSION, BENIGN ESSENTIAL Uncontrolled. Has been  difficult to find a medication wo side effects. Will do trial of losartan - Lipid panel - CMP14+EGFR  3. Medication side effects See #1 Other orders - losartan (COZAAR) 50 MG tablet; Take 1 tablet (50 mg total) by mouth daily.   Return in about 4 weeks (around 03/22/2019) for HTN.    Rutherford Guys, MD Primary Care at [place], [place] [address] Ph.  [phone] Fax [phone]

## 2019-02-22 NOTE — Patient Instructions (Signed)
° ° ° °  If you have lab work done today you will be contacted with your lab results within the next 2 weeks.  If you have not heard from us then please contact us. The fastest way to get your results is to register for My Chart. ° ° °IF you received an x-ray today, you will receive an invoice from [facility]. Please contact [facility] at [phone] with questions or concerns regarding your invoice.  ° °IF you received labwork today, you will receive an invoice from [facility]. Please contact [facility] at [phone] with questions or concerns regarding your invoice.  ° °Our billing staff will not be able to assist you with questions regarding bills from these companies. ° °You will be contacted with the lab results as soon as they are available. The fastest way to get your results is to activate your My Chart account. Instructions are located on the last page of this paperwork. If you have not heard from us regarding the results in 2 weeks, please contact this office. °  ° ° ° °

## 2019-02-23 LAB — CMP14+EGFR
ALT: 19 IU/L (ref 0–32)
AST: 20 IU/L (ref 0–40)
Albumin/Globulin Ratio: 1.9 (ref 1.2–2.2)
Albumin: 4.5 g/dL (ref 3.6–4.6)
Alkaline Phosphatase: 75 IU/L (ref 39–117)
BUN/Creatinine Ratio: 16 (ref 12–28)
BUN: 14 mg/dL (ref 8–27)
Bilirubin Total: 0.4 mg/dL (ref 0.0–1.2)
CO2: 26 mmol/L (ref 20–29)
Calcium: 9.6 mg/dL (ref 8.7–10.3)
Chloride: 96 mmol/L (ref 96–106)
Creatinine, Ser: 0.89 mg/dL (ref 0.57–1.00)
GFR calc Af Amer: 68 mL/min/{1.73_m2} (ref >59)
GFR calc non Af Amer: 59 mL/min/{1.73_m2} — ABNORMAL LOW (ref >59)
Globulin, Total: 2.4 g/dL (ref 1.5–4.5)
Glucose: 168 mg/dL — ABNORMAL HIGH (ref 65–99)
Potassium: 4.5 mmol/L (ref 3.5–5.2)
Sodium: 138 mmol/L (ref 134–144)
Total Protein: 6.9 g/dL (ref 6.0–8.5)

## 2019-02-23 LAB — LIPID PANEL
Chol/HDL Ratio: 3.3 ratio (ref 0.0–4.4)
Cholesterol, Total: 181 mg/dL (ref 100–199)
HDL: 55 mg/dL (ref >39)
LDL Calculated: 84 mg/dL (ref 0–99)
Triglycerides: 208 mg/dL — ABNORMAL HIGH (ref 0–149)
VLDL Cholesterol Cal: 42 mg/dL — ABNORMAL HIGH (ref 5–40)

## 2019-02-27 ENCOUNTER — Telehealth: Payer: Self-pay | Admitting: Family Medicine

## 2019-02-27 NOTE — Telephone Encounter (Signed)
Copied from CRM [id number]. Topic: Quick Communication - Rx Refill/Question >> Feb 27, 2019 11:13 AM Maia Petties wrote: Medication: losartan (COZAAR) 50 MG tablet - pt Friend, Barrett Henle, called stating that pt is not going to take losartan anymore and she said she will go back to her old medicine (did not know the name of the old medicine) - pt wanting appt on 03/17/2019 because she cannot wait until 4/23. Please advise on work in for appt and change of medication BP reported - did not have the bottom # - Thot said pt only told him Thursday 180 and Friday 188   Has the patient contacted their pharmacy? no Preferred Pharmacy (with phone number or street name): [id number] [place], [place] - [address] AT [facility] OF [facility] & [address] [phone] (Phone) [phone] (Fax)

## 2019-02-28 ENCOUNTER — Other Ambulatory Visit: Payer: Self-pay

## 2019-02-28 DIAGNOSIS — I10 Essential (primary) hypertension: Secondary | ICD-10-CM

## 2019-02-28 MED ORDER — NIFEDIPINE ER OSMOTIC RELEASE 30 MG PO TB24: 30.0000 mg | ORAL_TABLET | Freq: Every day | ORAL | 0 refills | Status: DC

## 2019-02-28 NOTE — Telephone Encounter (Signed)
Spoke with pt Grandson and medication concerns and he states the Losartan was making Mrs. Luma have headaches so she does not want to take it. Relayed this message to provider Dr. Leretha Pol and informed me that it is ok to resend in her previous medication ( Procardia 30 mg) to pharmacy.  Rx has been sent and Lucila Maine has been informed.

## 2019-03-01 ENCOUNTER — Telehealth: Payer: Self-pay | Admitting: Family Medicine

## 2019-03-01 NOTE — Telephone Encounter (Signed)
Copied from CRM [id number]. Topic: General - Other >> Mar 01, 2019 10:11 AM Tamela Oddi wrote: Reason for CRM: Patient's husband called to speak with the nurse regarding her BP Medicine.  It sounds like the pharmacy does not have the BP medicine until April.  Patient's husband wants to know what can she take until then.  He said that the patient should be taking this medication everyday.  Please call to speak with the husband regarding this issue.  May need a [place] interpreter.  CB# [phone]

## 2019-03-07 MED ORDER — OLMESARTAN MEDOXOMIL 20 MG PO TABS: 20.0000 mg | ORAL_TABLET | Freq: Every day | ORAL | 0 refills | Status: DC

## 2019-03-07 NOTE — Telephone Encounter (Signed)
Please let patient know that I have sent in a prescription for olmesartan 20mg  for her BP  thanks

## 2019-03-07 NOTE — Telephone Encounter (Signed)
Please advise as pharmacy is out of BP medication until April

## 2019-03-17 ENCOUNTER — Telehealth: Payer: Self-pay | Admitting: Family Medicine

## 2019-03-17 DIAGNOSIS — T887XXA Unspecified adverse effect of drug or medicament, initial encounter: Secondary | ICD-10-CM

## 2019-03-17 DIAGNOSIS — I10 Essential (primary) hypertension: Secondary | ICD-10-CM

## 2019-03-17 NOTE — Telephone Encounter (Signed)
Copied from CRM [id number]. Topic: Quick Communication - Rx Refill/Question >> Mar 17, 2019  2:59 PM Richarda Blade wrote: Medication: amLODipine (NORVASC) 10 MG tablet [[id number]]    Has the patient contacted their pharmacy? No (Agent: If no, request that the patient contact the pharmacy for the refill.) Patient does not have any refills and would like this medication again and for the MG to be increased    Preferred Pharmacy (with phone number or street name): [facility] #[id number] [place], [place] - [address] AT [facility] OF [facility] & [address] [phone] (Phone) [phone] (Fax)    Agent: Please be advised that RX refills may take up to 3 business days. We ask that you follow-up with your pharmacy.

## 2019-03-18 NOTE — Telephone Encounter (Signed)
Pt. Is requesting a medication other than amlodipine as amlodipine causes a burning sensation. Pt. Asserts blood pressure has not been helped by amlodipiene

## 2019-03-21 NOTE — Telephone Encounter (Signed)
Spoke with pt husband and informed him that provider has placed a referral for GI and they will be giving them a call to schedule pt for appointment since all the medication that has been prescribed for her B/P is giving her heartburn. He verbalized understanding.

## 2019-03-21 NOTE — Telephone Encounter (Signed)
Please advise, would like a different B/P medication.

## 2019-03-21 NOTE — Telephone Encounter (Signed)
I am going to refer her to cardiology as she has been intolerant to every medication I prescribe. She also should probably see GI. thanks

## 2019-04-10 ENCOUNTER — Telehealth: Payer: Self-pay | Admitting: Family Medicine

## 2019-04-10 NOTE — Telephone Encounter (Signed)
04/10/2019 - THIS PATIENT'S HUSBAND (THOT LIANG) CALLED TO SAY HE WANTS HIS WIFE'S APPOINTMENT CANCELLED. HE SAID HE JUST WANTS DR. IRMA TO CALL IN HER BLOOD PRESSURE MEDICINE. HER APPOINTMENT IS SCHEDULED FOR THURSDAY 04/13/2019. I TRIED TO EXPLAIN TO HIM THAT SHE DOES NOT NEED TO COME INTO THE OFFICE AND THAT WE WOULD CALL HER OVER THE PHONE AND HAVE A TRANSLATOR WHO SPOKE HER LANGUAGE. HE INSIST HE JUST WANTS TO GET HER MEDICATIONS REFILLED. (I DID NOT CANCEL THIS APPOINTMENT YET DEPENDING ON WHAT DR. IRMA SAYS FIRST). IF SHE SAYS WE DO NEED TO KEEP THIS APPOINTMENT WE WILL NEED TO SCHEDULE IT AS A VIRTUAL AND TELEMED.  BEST PHONE [phone] (HUSBAND IS THOT LIANG) MBC

## 2019-04-12 NOTE — Telephone Encounter (Signed)
I called and talked with pt's husband and he states that he will be available  tomorrow at 9 am for the telemed visit with Dr. Leretha Pol

## 2019-04-13 ENCOUNTER — Other Ambulatory Visit: Payer: Self-pay

## 2019-04-13 ENCOUNTER — Telehealth: Payer: Medicare Other | Admitting: Family Medicine

## 2019-04-14 ENCOUNTER — Encounter: Payer: Medicare Other | Admitting: Family Medicine

## 2019-04-14 ENCOUNTER — Other Ambulatory Visit: Payer: Self-pay | Admitting: Family Medicine

## 2019-04-14 ENCOUNTER — Other Ambulatory Visit: Payer: Self-pay

## 2019-04-14 DIAGNOSIS — E119 Type 2 diabetes mellitus without complications: Secondary | ICD-10-CM

## 2019-04-14 MED ORDER — AMLODIPINE BESYLATE 10 MG PO TABS: 10.0000 mg | ORAL_TABLET | Freq: Every day | ORAL | 0 refills | Status: DC

## 2019-04-14 MED ORDER — GLIMEPIRIDE 1 MG PO TABS: 0.5000 mg | ORAL_TABLET | Freq: Every day | ORAL | 0 refills | Status: DC

## 2019-04-14 NOTE — Progress Notes (Signed)
Med refill of amlodipine and glimpiride

## 2019-04-15 ENCOUNTER — Other Ambulatory Visit: Payer: Self-pay | Admitting: Family Medicine

## 2019-04-28 ENCOUNTER — Ambulatory Visit: Payer: Medicare Other | Admitting: Family Medicine

## 2019-05-17 ENCOUNTER — Other Ambulatory Visit: Payer: Self-pay

## 2019-05-17 ENCOUNTER — Ambulatory Visit (INDEPENDENT_AMBULATORY_CARE_PROVIDER_SITE_OTHER): Payer: Medicare Other | Admitting: Family Medicine

## 2019-05-17 ENCOUNTER — Encounter: Payer: Self-pay | Admitting: Family Medicine

## 2019-05-17 VITALS — BP 122/68 | HR 75 | Temp 99.0°F | Ht 59.0 in | Wt 101.0 lb

## 2019-05-17 DIAGNOSIS — I10 Essential (primary) hypertension: Secondary | ICD-10-CM

## 2019-05-17 DIAGNOSIS — M542 Cervicalgia: Secondary | ICD-10-CM

## 2019-05-17 DIAGNOSIS — E119 Type 2 diabetes mellitus without complications: Secondary | ICD-10-CM

## 2019-05-17 MED ORDER — GLIMEPIRIDE 1 MG PO TABS: 0.5000 mg | ORAL_TABLET | Freq: Two times a day (BID) | ORAL | 1 refills | Status: DC

## 2019-05-17 MED ORDER — AMLODIPINE BESYLATE 10 MG PO TABS: ORAL_TABLET | ORAL | 1 refills | Status: DC

## 2019-05-17 MED ORDER — IBUPROFEN 600 MG PO TABS: 600.0000 mg | ORAL_TABLET | Freq: Two times a day (BID) | ORAL | 1 refills | Status: DC | PRN

## 2019-05-17 MED ORDER — OLMESARTAN MEDOXOMIL 20 MG PO TABS: 20.0000 mg | ORAL_TABLET | Freq: Every day | ORAL | 0 refills | Status: DC

## 2019-05-17 NOTE — Progress Notes (Signed)
5/27/20202:25 PM  Robin Andrews 09-07-1931, 85 y.o., female [id number]  Chief Complaint  Patient presents with  . Hypertension    wanting to discuss the results of last lab, needing refill on norvasc. Wants to talk about increasing the bp med  . Diabetes    needs refill on glimipiride  . Neck Pain    pain stopped in 2019, has come back, asking for referral to follow up on pain    HPI:   Patient is a 85 y.o. female with past medical history significant for DM2 and HTN who presents today for followup  Last OV March 2020 Having side effects to multiple HTN meds Trial of losartan which gave her headaches - she wanted to get back on procardia XL which she tolerated better but pharamcy was out, therefore sent in rx for olmesartan 62m  Also referred to cards and GI  Husband serves as interpreter  Today having neck pain Started after sleeping wrong Taking ibuprofen 2054monce a day - not helping Denies any numbness or tingling of her arms Denies any focal weakness  She has only been taking amlodipine 1037mnce a day Checks BP at home mostly at goal She never tried other medications She denies any more gerd a/w amlodipine, has been tolerating well, denies any side effects occ will have several days of BP > 150, requesting for a second medication to take then  Taking glimperide 0.5mg40mD with meal Denies any hypoglycemia  She did not follow thru with referrals, not interested  Lab Results  Component Value Date   HGBA1C 4.9 02/22/2019   Lab Results  Component Value Date   CREATININE 0.89 02/22/2019   BUN 14 02/22/2019   NA 138 02/22/2019   K 4.5 02/22/2019   CL 96 02/22/2019   CO2 26 02/22/2019   Lab Results  Component Value Date   CHOL 181 02/22/2019   HDL 55 02/22/2019   LDLCALC 84 02/22/2019   TRIG 208 (H) 02/22/2019   CHOLHDL 3.3 02/22/2019     Fall Risk  05/17/2019 04/14/2019 02/22/2019 11/08/2018 10/25/2018  Falls in the past year? 0 0 0 0 0  Number falls  in past yr: - 0 0 - -  Injury with Fall? - 0 0 - -  Follow up - Falls evaluation completed Falls evaluation completed - -     Depression screen PHQ [facility] 05/17/2019 04/14/2019 02/22/2019  Decreased Interest 0 0 0  Down, Depressed, Hopeless 0 0 0  PHQ - 2 Score 0 0 0    Allergies  Allergen Reactions  . Hydrochlorothiazide     Severe hyponatremia resulting in hospitalization  . Lisinopril     Prior to Admission medications   Medication Sig Start Date End Date Taking? Authorizing Provider  albuterol (PROVENTIL HFA;VENTOLIN HFA) 108 (90 Base) MCG/ACT inhaler Inhale 2 puffs into the lungs every 6 (six) hours as needed for wheezing or shortness of breath. 01/12/19  Yes SantRutherford Guys  amLODipine (NORVASC) 10 MG tablet TAKE 1 TABLET(10 MG) BY MOUTH DAILY 04/15/19  Yes SantRutherford Guys  Blood Pressure Monitoring (BLOOD PRESSURE KIT) DEVI 1 Device by Does not apply route daily. 10/17/18  Yes WiseTimmothy EuleritTanz[place]PA-C  glimepiride (AMARYL) 1 MG tablet Take 0.5 tablets (0.5 mg total) by mouth daily with breakfast. 04/14/19  Yes SantRutherford Guys  NIFEdipine (PROCARDIA-XL/NIFEDICAL-XL) 30 MG 24 hr tablet Take 1 tablet (30 mg total) by mouth daily. 02/28/19  Yes SantPamella PertmaBenay Spice  M, MD  olmesartan (BENICAR) 20 MG tablet Take 1 tablet (20 mg total) by mouth daily. 03/07/19  Yes Rutherford Guys, MD    Past Medical History:  Diagnosis Date  . Degenerative disc disease   . Diabetes mellitus without complication ([place])   . Hypertension   . Pruritus     Past Surgical History:  Procedure Laterality Date  . ANTERIOR CERVICAL DECOMP/DISCECTOMY FUSION  02/2010   C3-6/notes 03/09/2010  . BACK SURGERY    . DILATION AND CURETTAGE, DIAGNOSTIC / THERAPEUTIC      Social History   Tobacco Use  . Smoking status: Never Smoker  . Smokeless tobacco: Never Used  Substance Use Topics  . Alcohol use: No    Family History  Family history unknown: Yes    Review of Systems  Constitutional: Negative  for chills and fever.  Respiratory: Negative for cough and shortness of breath.   Cardiovascular: Negative for chest pain, palpitations and leg swelling.  Gastrointestinal: Negative for abdominal pain, nausea and vomiting.     OBJECTIVE:  Today's Vitals   05/17/19 1418  BP: 122/68  Pulse: 75  Temp: 99 F (37.2 C)  TempSrc: Oral  SpO2: 97%  Weight: 101 lb (45.8 kg)  Height: _0  (1.499 m)   Body mass index is 20.4 kg/m.   Physical Exam Vitals signs and nursing note reviewed.  Constitutional:      Appearance: She is well-developed.  HENT:     Head: Normocephalic and atraumatic.     Mouth/Throat:     Pharynx: No oropharyngeal exudate.  Eyes:     General: No scleral icterus.    Conjunctiva/sclera: Conjunctivae normal.     Pupils: Pupils are equal, round, and reactive to light.  Neck:     Musculoskeletal: Neck supple.  Cardiovascular:     Rate and Rhythm: Normal rate and regular rhythm.     Heart sounds: Normal heart sounds. No murmur. No friction rub. No gallop.   Pulmonary:     Effort: Pulmonary effort is normal.     Breath sounds: Normal breath sounds. No wheezing or rales.  Skin:    General: Skin is warm and dry.  Neurological:     Mental Status: She is alert and oriented to person, place, and time.     ASSESSMENT and PLAN  1. HYPERTENSION, BENIGN ESSENTIAL Controlled. After long conversation, will add olmesartan if BP > 150/90.  - amLODipine (NORVASC) 10 MG tablet; TAKE 1 TABLET(10 MG) BY MOUTH DAILY - olmesartan (BENICAR) 20 MG tablet; Take 1 tablet (20 mg total) by mouth daily. Take for BP > 150/90  2. Controlled type 2 diabetes mellitus without complication, without long-term current use of insulin (HCC) Controlled. Denies hypoglycemia. Cont current regime. - glimepiride (AMARYL) 1 MG tablet; Take 0.5 tablets (0.5 mg total) by mouth 2 (two) times daily.  3. Neck pain Discussed supportive measures. - ibuprofen (ADVIL) 600 MG tablet; Take 1 tablet  (600 mg total) by mouth every 12 (twelve) hours as needed.   Return in about 6 months (around 11/17/2019).    Rutherford Guys, MD Primary Care at [facility] [place], [place] [address] Ph.  [phone] Fax [phone]

## 2019-05-17 NOTE — Patient Instructions (Signed)
° ° ° °  If you have lab work done today you will be contacted with your lab results within the next 2 weeks.  If you have not heard from us then please contact us. The fastest way to get your results is to register for My Chart. ° ° °IF you received an x-ray today, you will receive an invoice from [facility]. Please contact [facility] at [phone] with questions or concerns regarding your invoice.  ° °IF you received labwork today, you will receive an invoice from [facility]. Please contact [facility] at [phone] with questions or concerns regarding your invoice.  ° °Our billing staff will not be able to assist you with questions regarding bills from these companies. ° °You will be contacted with the lab results as soon as they are available. The fastest way to get your results is to activate your My Chart account. Instructions are located on the last page of this paperwork. If you have not heard from us regarding the results in 2 weeks, please contact this office. °  ° ° ° °

## 2019-06-16 ENCOUNTER — Other Ambulatory Visit: Payer: Self-pay

## 2019-06-16 ENCOUNTER — Ambulatory Visit (INDEPENDENT_AMBULATORY_CARE_PROVIDER_SITE_OTHER): Payer: Medicare Other | Admitting: Family Medicine

## 2019-06-16 ENCOUNTER — Encounter: Payer: Self-pay | Admitting: Family Medicine

## 2019-06-16 VITALS — BP 153/67 | HR 72 | Temp 98.5°F | Ht 59.0 in | Wt 99.0 lb

## 2019-06-16 DIAGNOSIS — I10 Essential (primary) hypertension: Secondary | ICD-10-CM

## 2019-06-16 MED ORDER — AMLODIPINE BESYLATE 10 MG PO TABS: ORAL_TABLET | ORAL | 1 refills | Status: DC

## 2019-06-16 MED ORDER — OLMESARTAN MEDOXOMIL 40 MG PO TABS: 40.0000 mg | ORAL_TABLET | Freq: Every day | ORAL | 1 refills | Status: DC

## 2019-06-16 NOTE — Progress Notes (Signed)
6/26/202011:42 AM  Robin Andrews August 16, 1931, 85 y.o., female [id number]  Chief Complaint  Patient presents with  . Hypertension    feels taht the meds that she is taking for bp is not working. Taking the amlodipine and and benicar. Numbers at home are in the 150-160's     HPI:   Patient is a 85 y.o. female with past medical history significant for DM2 and HTN who presents today for followup  Last OV May 2020 Added olmesartan 79m if BP > 150/90 Has been taking amlodipine 165min AM and olmesartan 2031mn the afternoon Checking BP in the morning and evenings And everytime > 140/90  Lab Results  Component Value Date   CREATININE 0.89 02/22/2019   BUN 14 02/22/2019   NA 138 02/22/2019   K 4.5 02/22/2019   CL 96 02/22/2019   CO2 26 02/22/2019   BP Readings from Last 3 Encounters:  06/16/19 (!) 153/67  05/17/19 122/68  02/22/19 (!) 146/68    Depression screen PHQ 2/9 06/16/2019 05/17/2019 04/14/2019  Decreased Interest 0 0 0  Down, Depressed, Hopeless 0 0 0  PHQ - 2 Score 0 0 0    Fall Risk  06/16/2019 05/17/2019 04/14/2019 02/22/2019 11/08/2018  Falls in the past year? 0 0 0 0 0  Number falls in past yr: 0 - 0 0 -  Injury with Fall? 0 - 0 0 -  Follow up - - Falls evaluation completed Falls evaluation completed -     Allergies  Allergen Reactions  . Hydrochlorothiazide     Severe hyponatremia resulting in hospitalization  . Lisinopril     Prior to Admission medications   Medication Sig Start Date End Date Taking? Authorizing Provider  albuterol (PROVENTIL HFA;VENTOLIN HFA) 108 (90 Base) MCG/ACT inhaler Inhale 2 puffs into the lungs every 6 (six) hours as needed for wheezing or shortness of breath. 01/12/19  Yes SanRutherford GuysD  amLODipine (NORVASC) 10 MG tablet TAKE 1 TABLET(10 MG) BY MOUTH DAILY 05/17/19  Yes SanRutherford GuysD  Blood Pressure Monitoring (BLOOD PRESSURE KIT) DEVI 1 Device by Does not apply route daily. 10/17/18  Yes WisTimmothy EulerriT[place] PA-C   glimepiride (AMARYL) 1 MG tablet Take 0.5 tablets (0.5 mg total) by mouth 2 (two) times daily. 05/17/19  Yes SanRutherford GuysD  ibuprofen (ADVIL) 600 MG tablet Take 1 tablet (600 mg total) by mouth every 12 (twelve) hours as needed. 05/17/19  Yes SanRutherford GuysD  olmesartan (BENICAR) 20 MG tablet Take 1 tablet (20 mg total) by mouth daily. Take for BP > 150/90 05/17/19  Yes SanRutherford GuysD    Past Medical History:  Diagnosis Date  . Degenerative disc disease   . Diabetes [place] ([place] . Hypertension   . Pruritus     Past Surgical History:  Procedure Laterality Date  . ANTERIOR CERVICAL DECOMP/DISCECTOMY FUSION  02/2010   C3-6/notes 03/09/2010  . BACK SURGERY    . DILATION AND CURETTAGE, DIAGNOSTIC / THERAPEUTIC      Social History   Tobacco Use  . Smoking status: Never Smoker  . Smokeless tobacco: Never Used  Substance Use Topics  . Alcohol use: No    Family History  Family history unknown: Yes    Review of Systems  Constitutional: Negative for chills and fever.  Respiratory: Negative for cough and shortness of breath.   Cardiovascular: Negative for chest pain, palpitations and leg swelling.  Gastrointestinal: Negative  for abdominal pain, nausea and vomiting.     OBJECTIVE:  Today's Vitals   06/16/19 1030  BP: (!) 153/67  Pulse: 72  Temp: 98.5 F (36.9 C)  TempSrc: Oral  SpO2: 97%  Weight: 99 lb (44.9 kg)  Height: 4' 11"  (1.499 m)   Body mass index is 20 kg/m.   Physical Exam Vitals signs and nursing note reviewed.  Constitutional:      Appearance: She is well-developed.  HENT:     Head: Normocephalic and atraumatic.     Mouth/Throat:     Pharynx: No oropharyngeal exudate.  Eyes:     General: No scleral icterus.    Conjunctiva/sclera: Conjunctivae normal.     Pupils: Pupils are equal, round, and reactive to light.  Neck:     Musculoskeletal: Neck supple.  Cardiovascular:     Rate and Rhythm: Normal rate and  regular rhythm.     Heart sounds: Normal heart sounds. No murmur. No friction rub. No gallop.   Pulmonary:     Effort: Pulmonary effort is normal.     Breath sounds: Normal breath sounds. No wheezing or rales.  Skin:    General: Skin is warm and dry.  Neurological:     Mental Status: She is alert and oriented to person, place, and time.      ASSESSMENT and PLAN  1. HYPERTENSION, BENIGN ESSENTIAL Uncontrolled. Increasing olmesartan. Check labs prior to next visit - Basic metabolic panel; Future  Other orders - olmesartan (BENICAR) 40 MG tablet; Take 1 tablet (40 mg total) by mouth daily. - amLODipine (NORVASC) 10 MG tablet; TAKE 1 TABLET(10 MG) BY MOUTH DAILY  Return in about 4 weeks (around 07/14/2019) for BP.    Rutherford Guys, MD Primary Care at [facility] [place], [place] [address] Ph.  [phone] Fax [phone]

## 2019-06-16 NOTE — Patient Instructions (Signed)
° ° ° °  If you have lab work done today you will be contacted with your lab results within the next 2 weeks.  If you have not heard from us then please contact us. The fastest way to get your results is to register for My Chart. ° ° °IF you received an x-ray today, you will receive an invoice from [facility]. Please contact [facility] at [phone] with questions or concerns regarding your invoice.  ° °IF you received labwork today, you will receive an invoice from [facility]. Please contact [facility] at [phone] with questions or concerns regarding your invoice.  ° °Our billing staff will not be able to assist you with questions regarding bills from these companies. ° °You will be contacted with the lab results as soon as they are available. The fastest way to get your results is to activate your My Chart account. Instructions are located on the last page of this paperwork. If you have not heard from us regarding the results in 2 weeks, please contact this office. °  ° ° ° °

## 2019-06-21 ENCOUNTER — Ambulatory Visit: Payer: Self-pay

## 2019-06-21 NOTE — Telephone Encounter (Signed)
Message from Beverley Fiedler sent at 06/21/2019 4:07 PM EDT  Summary: Clinical Advice   Patients spouse would like to speak with nurse  in regards to medications amlopidine and olmesartan not working for patient her blood pressure is 153/...husband couldn't remember bottom number. Please advise

## 2019-06-21 NOTE — Telephone Encounter (Signed)
Patient's husband called, no answer, unable to leave message due to mailbox not set up.

## 2019-06-22 ENCOUNTER — Telehealth: Payer: Self-pay | Admitting: Family Medicine

## 2019-06-22 NOTE — Telephone Encounter (Signed)
Patient seen recently Has appt scheduled for the July 24th

## 2019-06-22 NOTE — Telephone Encounter (Signed)
Pt does not need an appt, just wants medications fixed   Copied from [place] [phone]. Topic: General - Other >> Jun 21, 2019  4:02 PM Rainey Pines A wrote: Patient would like a callback  in regards to medications amlopidine and olmesartan not working for her and would like to schedule an appointment

## 2019-06-22 NOTE — Progress Notes (Signed)
This encounter was created in error - please disregard.

## 2019-06-26 ENCOUNTER — Telehealth: Payer: Self-pay | Admitting: Family Medicine

## 2019-06-26 NOTE — Telephone Encounter (Signed)
Med change

## 2019-06-26 NOTE — Telephone Encounter (Signed)
Pt's friend/interpreter called to follow up on request for new BP medication. Advised that previous message was sent to Dr. Pamella Pert regarding this. He stated pt's BP was high and agent suggested speaking with NT. He stated he was not with the pt and he lived far away from her nor does she speak [place]. Please advise. CB#[phone]

## 2019-06-26 NOTE — Telephone Encounter (Signed)
Patient is upset and wants new medication put in right now for the BP , does not want to wait for appnt  Would like a call back  [facility]

## 2019-06-26 NOTE — Telephone Encounter (Signed)
Pt has appointment scheduled for follow-up about medications.

## 2019-06-26 NOTE — Telephone Encounter (Signed)
Please advise, pt is upset about medication she states it is not helping with B/P and would like something different.

## 2019-06-27 NOTE — Telephone Encounter (Signed)
Patient with BP at goal at last office visit. Patient to be seen as scheduled on July 24th. Please have her bring in her home BP cuff at that appointment. Thank you.

## 2019-06-27 NOTE — Telephone Encounter (Signed)
Pt called back to advise olmesartan (BENICAR) 40 MG tablet is not working and they would like an update. Pt requests call back

## 2019-06-27 NOTE — Telephone Encounter (Signed)
Pt states the 40mg  tablet doesn't help her. I read the message to her. she insisted I put a message in. Please advise at [phone]

## 2019-06-28 NOTE — Telephone Encounter (Signed)
Pt calling back upset that no one has called them back.  Pt needs to know what to do about medication.

## 2019-06-30 NOTE — Telephone Encounter (Signed)
Spoke with pt husband about concerns today and advise him of Dr. Pamella Pert advise, he verbalized understanding.

## 2019-07-03 ENCOUNTER — Encounter: Payer: Medicare Other | Admitting: Family Medicine

## 2019-07-03 ENCOUNTER — Other Ambulatory Visit: Payer: Self-pay

## 2019-07-03 NOTE — Progress Notes (Signed)
Pt is not able to come into the office due to not having a ride to bring her. She is very concerned about her bp numbers at home. She is still using the same cuff. Pt was explained that the cuff needs to be measured against a cuff her in office, that is not possible at the moment. Pt believes it is the benicar that is causing her to have these high readings and would like to go back to the regiment she was on before. She is afraid that the medication she is currently on will cause long last negative effects.

## 2019-07-04 NOTE — Telephone Encounter (Signed)
Spoke with pt husband about his concerns and informed him that Rx was at the pharmacy for pick-up

## 2019-07-04 NOTE — Telephone Encounter (Signed)
Pt husband called to ask about the medication that was to be called in for the Pt/ please advise

## 2019-07-05 NOTE — Telephone Encounter (Signed)
Patients husband called and stated that patient went to pharmacy and pharmacy stated they had no record of a new medication sent over. Patient is asking for a callback

## 2019-07-14 ENCOUNTER — Other Ambulatory Visit: Payer: Self-pay

## 2019-07-14 ENCOUNTER — Encounter: Payer: Self-pay | Admitting: Family Medicine

## 2019-07-14 ENCOUNTER — Ambulatory Visit (INDEPENDENT_AMBULATORY_CARE_PROVIDER_SITE_OTHER): Payer: Medicare Other | Admitting: Family Medicine

## 2019-07-14 VITALS — BP 136/60 | HR 77 | Temp 98.3°F | Ht 59.0 in | Wt 102.0 lb

## 2019-07-14 DIAGNOSIS — I10 Essential (primary) hypertension: Secondary | ICD-10-CM | POA: Diagnosis not present

## 2019-07-14 DIAGNOSIS — E119 Type 2 diabetes mellitus without complications: Secondary | ICD-10-CM | POA: Diagnosis not present

## 2019-07-14 MED ORDER — OLMESARTAN MEDOXOMIL 20 MG PO TABS: 20.0000 mg | ORAL_TABLET | Freq: Every day | ORAL | 1 refills | Status: DC

## 2019-07-14 MED ORDER — AMLODIPINE BESYLATE 10 MG PO TABS: ORAL_TABLET | ORAL | 1 refills | Status: DC

## 2019-07-14 MED ORDER — GLIMEPIRIDE 1 MG PO TABS: 0.5000 mg | ORAL_TABLET | Freq: Two times a day (BID) | ORAL | 1 refills | Status: DC

## 2019-07-14 NOTE — Patient Instructions (Signed)
° ° ° °  If you have lab work done today you will be contacted with your lab results within the next 2 weeks.  If you have not heard from us then please contact us. The fastest way to get your results is to register for My Chart. ° ° °IF you received an x-ray today, you will receive an invoice from [facility]. Please contact [facility] at [phone] with questions or concerns regarding your invoice.  ° °IF you received labwork today, you will receive an invoice from [facility]. Please contact [facility] at [phone] with questions or concerns regarding your invoice.  ° °Our billing staff will not be able to assist you with questions regarding bills from these companies. ° °You will be contacted with the lab results as soon as they are available. The fastest way to get your results is to activate your My Chart account. Instructions are located on the last page of this paperwork. If you have not heard from us regarding the results in 2 weeks, please contact this office. °  ° ° ° °

## 2019-07-14 NOTE — Progress Notes (Signed)
7/24/202011:36 AM  Robin Andrews 04/18/1931, 85 y.o., female [id number]  Chief Complaint  Patient presents with  . Medication Refill    pt stated that Benicar 20 mg has helpped pt but the 40 mg has not. Pt stated "the brown tablet work, white one no work "    HPI:   Patient is a 85 y.o. female with past medical history significant for HTN and HLP who presents today for BP follouwp  Last OV increased benicar to 19m once a day Continued amlodipine 163monce a day  She reports that she was not tolerating increased dose She has gone back to taking amlodipine 1058mn AM and olmesartan 110m85m the afternoon and her BP has been very well controlled, 130/80s She denies any side effects Requesting new prescriptions  Depression screen PHQ [facility] 07/14/2019 07/03/2019 06/16/2019  Decreased Interest 0 0 0  Down, Depressed, Hopeless 0 0 0  PHQ - 2 Score 0 0 0    Fall Risk  07/14/2019 07/03/2019 06/16/2019 05/17/2019 04/14/2019  Falls in the past year? 0 0 0 0 0  Number falls in past yr: 0 0 0 - 0  Injury with Fall? 0 0 0 - 0  Follow up Falls evaluation completed - - - Falls evaluation completed     Allergies  Allergen Reactions  . Hydrochlorothiazide     Severe hyponatremia resulting in hospitalization  . Lisinopril     Prior to Admission medications   Medication Sig Start Date End Date Taking? Authorizing Provider  albuterol (PROVENTIL HFA;VENTOLIN HFA) 108 (90 Base) MCG/ACT inhaler Inhale 2 puffs into the lungs every 6 (six) hours as needed for wheezing or shortness of breath. 01/12/19  Yes SantRutherford Guys  amLODipine (NORVASC) 10 MG tablet TAKE 1 TABLET(10 MG) BY MOUTH DAILY 06/16/19  Yes SantRutherford Guys  Blood Pressure Monitoring (BLOOD PRESSURE KIT) DEVI 1 Device by Does not apply route daily. 10/17/18  Yes WiseTimmothy EuleritTanzaniaPA-C  glimepiride (AMARYL) 1 MG tablet Take 0.5 tablets (0.5 mg total) by mouth 2 (two) times daily. 05/17/19  Yes SantRutherford Guys  olmesartan  (BENICAR) 20 MG tablet Take 20 mg by mouth daily.   Yes [provider]  olmesartan (BENICAR) 40 MG tablet Take 1 tablet (40 mg total) by mouth daily. Patient not taking: Reported on 07/14/2019 06/16/19   SantRutherford Guys    Past Medical History:  Diagnosis Date  . Degenerative disc disease   . Diabetes mellitus without complication (HCC)[place]. Hypertension   . Pruritus     Past Surgical History:  Procedure Laterality Date  . ANTERIOR CERVICAL DECOMP/DISCECTOMY FUSION  02/2010   C3-6/notes 03/09/2010  . BACK SURGERY    . DILATION AND CURETTAGE, DIAGNOSTIC / THERAPEUTIC      Social History   Tobacco Use  . Smoking status: Never Smoker  . Smokeless tobacco: Never Used  Substance Use Topics  . Alcohol use: No    Family History  Family history unknown: Yes    ROS Per hpi  OBJECTIVE:  Today's Vitals   07/14/19 1122 07/14/19 1143  BP: (!) 157/62 136/60  Pulse: 77   Temp:  98.3 F (36.8 C)  SpO2: 98%   Weight: 102 lb (46.3 kg)   Height: 4' 11"  (1.499 m)    Body mass index is 20.6 kg/m.   Physical Exam Vitals signs and nursing note reviewed.  Constitutional:      Appearance: She  is well-developed.  HENT:     Head: Normocephalic and atraumatic.     Mouth/Throat:     Pharynx: No oropharyngeal exudate.  Eyes:     General: No scleral icterus.    Conjunctiva/sclera: Conjunctivae normal.     Pupils: Pupils are equal, round, and reactive to light.  Neck:     Musculoskeletal: Neck supple.  Cardiovascular:     Rate and Rhythm: Normal rate and regular rhythm.     Heart sounds: Normal heart sounds. No murmur. No friction rub. No gallop.   Pulmonary:     Effort: Pulmonary effort is normal.     Breath sounds: Normal breath sounds. No wheezing or rales.  Skin:    General: Skin is warm and dry.  Neurological:     Mental Status: She is alert and oriented to person, place, and time.      ASSESSMENT and PLAN  1. HYPERTENSION, BENIGN ESSENTIAL  2.  Controlled type 2 diabetes mellitus without complication, without long-term current use of insulin (HCC) - glimepiride (AMARYL) 1 MG tablet; Take 0.5 tablets (0.5 mg total) by mouth 2 (two) times daily.  Other orders - amLODipine (NORVASC) 10 MG tablet; TAKE 1 TABLET(10 MG) BY MOUTH DAILY - olmesartan (BENICAR) 20 MG tablet; Take 1 tablet (20 mg total) by mouth daily.  Return in about 6 months (around 01/14/2020).    Rutherford Guys, MD Primary Care at [facility] [place], [place] [address] Ph.  [phone] Fax [phone]

## 2019-07-26 NOTE — Progress Notes (Signed)
This encounter was created in error - please disregard.

## 2019-10-11 ENCOUNTER — Ambulatory Visit: Payer: Medicare Other | Admitting: Emergency Medicine

## 2019-10-11 ENCOUNTER — Other Ambulatory Visit: Payer: Self-pay

## 2019-10-11 ENCOUNTER — Encounter: Payer: Self-pay | Admitting: Emergency Medicine

## 2019-10-11 ENCOUNTER — Ambulatory Visit (INDEPENDENT_AMBULATORY_CARE_PROVIDER_SITE_OTHER): Payer: Medicare Other | Admitting: Emergency Medicine

## 2019-10-11 VITALS — BP 121/62 | HR 72 | Temp 98.8°F | Resp 16 | Ht 59.0 in | Wt 102.2 lb

## 2019-10-11 DIAGNOSIS — I10 Essential (primary) hypertension: Secondary | ICD-10-CM

## 2019-10-11 DIAGNOSIS — G8929 Other chronic pain: Secondary | ICD-10-CM | POA: Diagnosis not present

## 2019-10-11 DIAGNOSIS — E1159 Type 2 diabetes mellitus with other circulatory complications: Secondary | ICD-10-CM | POA: Diagnosis not present

## 2019-10-11 DIAGNOSIS — M545 Low back pain, unspecified: Secondary | ICD-10-CM

## 2019-10-11 DIAGNOSIS — M542 Cervicalgia: Secondary | ICD-10-CM | POA: Diagnosis not present

## 2019-10-11 DIAGNOSIS — I152 Hypertension secondary to endocrine disorders: Secondary | ICD-10-CM

## 2019-10-11 MED ORDER — MELOXICAM 7.5 MG PO TABS: 7.5000 mg | ORAL_TABLET | Freq: Every day | ORAL | 0 refills | Status: DC | PRN

## 2019-10-11 NOTE — Patient Instructions (Addendum)
   If you have lab work done today you will be contacted with your lab results within the next 2 weeks.  If you have not heard from us then please contact us. The fastest way to get your results is to register for My Chart.   IF you received an x-ray today, you will receive an invoice from [facility]. Please contact  Radiology at [phone] with questions or concerns regarding your invoice.   IF you received labwork today, you will receive an invoice from [facility]. Please contact [facility] at [phone] with questions or concerns regarding your invoice.   Our billing staff will not be able to assist you with questions regarding bills from these companies.  You will be contacted with the lab results as soon as they are available. The fastest way to get your results is to activate your My Chart account. Instructions are located on the last page of this paperwork. If you have not heard from us regarding the results in 2 weeks, please contact this office.     Cervical Sprain  A cervical sprain is a stretch or tear in the tissues that connect bones (ligaments) in the neck. Most neck (cervical) sprains get better in 4-6 weeks. Follow these instructions at home: If you have a neck collar:  Wear it as told by your doctor. Do not take off (do not remove) the collar unless your doctor says that this is safe.  Ask your doctor before adjusting your collar.  If you have long hair, keep it outside of the collar.  Ask your doctor if you may take off the collar for cleaning and bathing. If you may take off the collar: ? Follow instructions from your doctor about how to take off the collar safely. ? Clean the collar by wiping it with mild soap and water. Let it air-dry all the way. ? If your collar has removable pads:  Take the pads out every 1-2 days.  Hand wash the pads with soap and water.  Let the pads air-dry all the way before you put them back in the collar. Do not  dry them in a clothes dryer. Do not dry them with a hair dryer. ? Check your skin under the collar for irritation or sores. If you see any, tell your doctor. Managing pain, stiffness, and swelling   Use a cervical traction device, if told by your doctor.  If told, put heat on the affected area. Do this before exercises (physical therapy) or as often as told by your doctor. Use the heat source that your doctor recommends, such as a moist heat pack or a heating pad. ? Place a towel between your skin and the heat source. ? Leave the heat on for 20-30 minutes. ? Take the heat off (remove the heat) if your skin turns bright red. This is very important if you cannot feel pain, heat, or cold. You may have a greater risk of getting burned.  Put ice on the affected area. ? Put ice in a plastic bag. ? Place a towel between your skin and the bag. ? Leave the ice on for 20 minutes, 2-3 times a day. Activity  Do not drive while wearing a neck collar. If you do not have a neck collar, ask your doctor if it is safe to drive.  Do not drive or use heavy machinery while taking prescription pain medicine or muscle relaxants, unless your doctor approves.  Do not lift anything that is heavier   than 10 lb (4.5 kg) until your doctor tells you that it is safe.  Rest as told by your doctor.  Avoid activities that make you feel worse. Ask your doctor what activities are safe for you.  Do exercises as told by your doctor or physical therapist. Preventing neck sprain  Practice good posture. Adjust your workstation to help with this, if needed.  Exercise regularly as told by your doctor or physical therapist.  Avoid activities that are risky or may cause a neck sprain (cervical sprain). General instructions  Take over-the-counter and prescription medicines only as told by your doctor.  Do not use any products that contain nicotine or tobacco. This includes cigarettes and e-cigarettes. If you need help  quitting, ask your doctor.  Keep all follow-up visits as told by your doctor. This is important. Contact a doctor if:  You have pain or other symptoms that get worse.  You have symptoms that do not get better after 2 weeks.  You have pain that does not get better with medicine.  You start to have new, unexplained symptoms.  You have sores or irritated skin from wearing your neck collar. Get help right away if:  You have very bad pain.  You have any of the following in any part of your body: ? Loss of feeling (numbness). ? Tingling. ? Weakness.  You cannot move a part of your body (you have paralysis).  Your activity level does not improve. Summary  A cervical sprain is a stretch or tear in the tissues that connect bones (ligaments) in the neck.  If you have a neck (cervical) collar, do not take off the collar unless your doctor says that this is safe.  Put ice on affected areas as told by your doctor.  Put heat on affected areas as told by your doctor.  Good posture and regular exercise can help prevent a neck sprain from happening again. This information is not intended to replace advice given to you by your health care provider. Make sure you discuss any questions you have with your health care provider. Document Released: 05/25/2008 Document Revised: 03/29/2019 Document Reviewed: 08/18/2016 Elsevier Patient Education  2020 Elsevier Inc.  

## 2019-10-11 NOTE — Progress Notes (Signed)
Robin Andrews 85 y.o.   Chief Complaint  Patient presents with  . Neck Pain    per patient had neck surgery 3-4 years ago and having pain now  . Back Pain    since surgery    HISTORY OF PRESENT ILLNESS: This is a 85 y.o. female with history of chronic back and neck pain, status post surgery 4 years ago, complaining of increased pain over the past several months.  No different symptoms just increased frequency.  Sharp pain to the back of her neck and low back.  Denies injuries.  Denies neurological symptoms.  HPI   Prior to Admission medications   Medication Sig Start Date End Date Taking? Authorizing Provider  amLODipine (NORVASC) 10 MG tablet TAKE 1 TABLET(10 MG) BY MOUTH DAILY 07/14/19  Yes Rutherford Guys, MD  glimepiride (AMARYL) 1 MG tablet Take 0.5 tablets (0.5 mg total) by mouth 2 (two) times daily. 07/14/19  Yes Rutherford Guys, MD  olmesartan (BENICAR) 20 MG tablet Take 1 tablet (20 mg total) by mouth daily. 07/14/19  Yes Rutherford Guys, MD  albuterol (PROVENTIL HFA;VENTOLIN HFA) 108 (90 Base) MCG/ACT inhaler Inhale 2 puffs into the lungs every 6 (six) hours as needed for wheezing or shortness of breath. Patient not taking: Reported on 10/11/2019 01/12/19   Rutherford Guys, MD  Blood Pressure Monitoring (BLOOD PRESSURE KIT) DEVI 1 Device by Does not apply route daily. 10/17/18   Tenna Delaine D, PA-C    Allergies  Allergen Reactions  . Hydrochlorothiazide     Severe hyponatremia resulting in hospitalization  . Lisinopril     Patient Active Problem List   Diagnosis Date Noted  . Diabetes mellitus type 2, controlled, without complications ([place]) 23/30/0762  . Cardiac murmur 12/18/2017  . Cervical disc disease 05/25/2012  . Chronic pain syndrome 05/25/2012  . Osteoarthritis 05/25/2012  . Hyperlipidemia 05/30/2007  . HYPERTENSION, BENIGN ESSENTIAL 05/30/2007  . ASTEATOTIC ECZEMA 05/30/2007    Past Medical History:  Diagnosis Date  . Degenerative disc disease    . Diabetes mellitus without complication ([place])   . Hypertension   . Pruritus     Past Surgical History:  Procedure Laterality Date  . ANTERIOR CERVICAL DECOMP/DISCECTOMY FUSION  02/2010   C3-6/notes 03/09/2010  . BACK SURGERY    . DILATION AND CURETTAGE, DIAGNOSTIC / THERAPEUTIC      Social History   Socioeconomic History  . Marital status: Widowed    Spouse name: Not on file  . Number of children: 1  . Years of education: Not on file  . Highest education level: Not on file  Occupational History  . Not on file  Social Needs  . Financial resource strain: Not on file  . Food insecurity    Worry: Not on file    Inability: Not on file  . Transportation needs    Medical: Not on file    Non-medical: Not on file  Tobacco Use  . Smoking status: Never Smoker  . Smokeless tobacco: Never Used  Substance and Sexual Activity  . Alcohol use: No  . Drug use: No  . Sexual activity: Not Currently    Birth control/protection: Abstinence  Lifestyle  . Physical activity    Days per week: Not on file    Minutes per session: Not on file  . Stress: Not on file  Relationships  . Social Herbalist on phone: Not on file    Gets together: Not on file  Attends religious service: Not on file    Active member of club or organization: Not on file    Attends meetings of clubs or organizations: Not on file    Relationship status: Not on file  . Intimate partner violence    Fear of current or ex partner: Not on file    Emotionally abused: Not on file    Physically abused: Not on file    Forced sexual activity: Not on file  Other Topics Concern  . Not on file  Social History Narrative  . Not on file    Family History  Family history unknown: Yes     Review of Systems  Constitutional: Negative.  Negative for chills and fever.  HENT: Negative for sore throat.   Respiratory: Negative.  Negative for cough and shortness of breath.   Cardiovascular: Negative.  Negative for  chest pain and palpitations.  Gastrointestinal: Negative.  Negative for abdominal pain, nausea and vomiting.  Genitourinary: Negative.  Negative for dysuria and hematuria.  Musculoskeletal: Positive for back pain and neck pain.  Skin: Negative.   Neurological: Negative for dizziness, sensory change, focal weakness and headaches.  All other systems reviewed and are negative.    Today's Vitals   10/11/19 1357  BP: 121/62  Pulse: 72  Resp: 16  Temp: 98.8 F (37.1 C)  TempSrc: Oral  SpO2: 97%  Weight: 102 lb 3.2 oz (46.4 kg)  Height: _0  (1.499 m)   Body mass index is 20.64 kg/m.   Physical Exam Vitals signs reviewed.  Constitutional:      Appearance: Normal appearance.  HENT:     Head: Normocephalic and atraumatic.  Eyes:     Extraocular Movements: Extraocular movements intact.     Pupils: Pupils are equal, round, and reactive to light.  Neck:     Musculoskeletal: No muscular tenderness.     Vascular: No carotid bruit.  Cardiovascular:     Rate and Rhythm: Normal rate and regular rhythm.     Pulses: Normal pulses.     Heart sounds: Murmur present.  Pulmonary:     Effort: Pulmonary effort is normal.     Breath sounds: Normal breath sounds.  Musculoskeletal: Normal range of motion.  Lymphadenopathy:     Cervical: No cervical adenopathy.  Skin:    General: Skin is warm and dry.     Capillary Refill: Capillary refill takes less than 2 seconds.  Neurological:     General: No focal deficit present.     Mental Status: She is alert and oriented to person, place, and time.     Sensory: No sensory deficit.     Motor: No weakness.     Coordination: Coordination normal.     Gait: Gait normal.     Deep Tendon Reflexes: Reflexes normal.  Psychiatric:        Mood and Affect: Mood normal.        Behavior: Behavior normal.      ASSESSMENT & PLAN: Robin Andrews was seen today for neck pain and back pain.  Diagnoses and all orders for this visit:  Chronic neck pain   Chronic bilateral low back pain without sciatica  Hypertension associated with type 2 diabetes mellitus ([place])  Other orders -     meloxicam (MOBIC) 7.5 MG tablet; Take 1 tablet (7.5 mg total) by mouth daily as needed for pain.    Patient Instructions       If you have lab work done today you will be  contacted with your lab results within the next 2 weeks.  If you have not heard from [place] then please contact us. The fastest way to get your results is to register for My Chart.   IF you received an x-ray today, you will receive an invoice from [facility]. Please contact [facility] at [phone] with questions or concerns regarding your invoice.   IF you received labwork today, you will receive an invoice from [facility]. Please contact [facility] at [phone] with questions or concerns regarding your invoice.   Our billing staff will not be able to assist you with questions regarding bills from these companies.  You will be contacted with the lab results as soon as they are available. The fastest way to get your results is to activate your My Chart account. Instructions are located on the last page of this paperwork. If you have not heard from [place] regarding the results in 2 weeks, please contact this office.     Cervical Sprain  A cervical sprain is a stretch or tear in the tissues that connect bones (ligaments) in the neck. Most neck (cervical) sprains get better in 4-6 weeks. Follow these instructions at home: If you have a neck collar:  Wear it as told by your doctor. Do not take off (do not remove) the collar unless your doctor says that this is safe.  Ask your doctor before adjusting your collar.  If you have long hair, keep it outside of the collar.  Ask your doctor if you may take off the collar for cleaning and bathing. If you may take off the collar: ? Follow instructions from your doctor about how to take off the collar safely. ? Clean the collar by  wiping it with mild soap and water. Let it air-dry all the way. ? If your collar has removable pads:  Take the pads out every 1-2 days.  Hand wash the pads with soap and water.  Let the pads air-dry all the way before you put them back in the collar. Do not dry them in a clothes dryer. Do not dry them with a hair dryer. ? Check your skin under the collar for irritation or sores. If you see any, tell your doctor. Managing pain, stiffness, and swelling   Use a cervical traction device, if told by your doctor.  If told, put heat on the affected area. Do this before exercises (physical therapy) or as often as told by your doctor. Use the heat source that your doctor recommends, such as a moist heat pack or a heating pad. ? Place a towel between your skin and the heat source. ? Leave the heat on for 20-30 minutes. ? Take the heat off (remove the heat) if your skin turns bright red. This is very important if you cannot feel pain, heat, or cold. You may have a greater risk of getting burned.  Put ice on the affected area. ? Put ice in a plastic bag. ? Place a towel between your skin and the bag. ? Leave the ice on for 20 minutes, 2-3 times a day. Activity  Do not drive while wearing a neck collar. If you do not have a neck collar, ask your doctor if it is safe to drive.  Do not drive or use heavy machinery while taking prescription pain medicine or muscle relaxants, unless your doctor approves.  Do not lift anything that is heavier than 10 lb (4.5 kg) until your doctor tells you that it is  safe.  Rest as told by your doctor.  Avoid activities that make you feel worse. Ask your doctor what activities are safe for you.  Do exercises as told by your doctor or physical therapist. Preventing neck sprain  Practice good posture. Adjust your workstation to help with this, if needed.  Exercise regularly as told by your doctor or physical therapist.  Avoid activities that are risky or may  cause a neck sprain (cervical sprain). General instructions  Take over-the-counter and prescription medicines only as told by your doctor.  Do not use any products that contain nicotine or tobacco. This includes cigarettes and e-cigarettes. If you need help quitting, ask your doctor.  Keep all follow-up visits as told by your doctor. This is important. Contact a doctor if:  You have pain or other symptoms that get worse.  You have symptoms that do not get better after 2 weeks.  You have pain that does not get better with medicine.  You start to have new, unexplained symptoms.  You have sores or irritated skin from wearing your neck collar. Get help right away if:  You have very bad pain.  You have any of the following in any part of your body: ? Loss of feeling (numbness). ? Tingling. ? Weakness.  You cannot move a part of your body (you have paralysis).  Your activity level does not improve. Summary  A cervical sprain is a stretch or tear in the tissues that connect bones (ligaments) in the neck.  If you have a neck (cervical) collar, do not take off the collar unless your doctor says that this is safe.  Put ice on affected areas as told by your doctor.  Put heat on affected areas as told by your doctor.  Good posture and regular exercise can help prevent a neck sprain from happening again. This information is not intended to replace advice given to you by your health care provider. Make sure you discuss any questions you have with your health care provider. Document Released: 05/25/2008 Document Revised: 03/29/2019 Document Reviewed: 08/18/2016 Elsevier Patient Education  2020 Elsevier Inc.      Agustina Caroli, MD [facility]

## 2019-10-23 ENCOUNTER — Other Ambulatory Visit: Payer: Self-pay | Admitting: Emergency Medicine

## 2019-11-09 ENCOUNTER — Other Ambulatory Visit: Payer: Self-pay

## 2019-11-09 ENCOUNTER — Encounter: Payer: Self-pay | Admitting: Family Medicine

## 2019-11-09 ENCOUNTER — Ambulatory Visit (INDEPENDENT_AMBULATORY_CARE_PROVIDER_SITE_OTHER): Payer: Medicare Other | Admitting: Family Medicine

## 2019-11-09 VITALS — BP 134/71 | HR 78 | Temp 98.3°F | Ht 59.0 in | Wt 100.0 lb

## 2019-11-09 DIAGNOSIS — M542 Cervicalgia: Secondary | ICD-10-CM | POA: Diagnosis not present

## 2019-11-09 DIAGNOSIS — E1159 Type 2 diabetes mellitus with other circulatory complications: Secondary | ICD-10-CM

## 2019-11-09 DIAGNOSIS — Z23 Encounter for immunization: Secondary | ICD-10-CM | POA: Diagnosis not present

## 2019-11-09 DIAGNOSIS — M545 Low back pain: Secondary | ICD-10-CM | POA: Diagnosis not present

## 2019-11-09 DIAGNOSIS — E119 Type 2 diabetes mellitus without complications: Secondary | ICD-10-CM | POA: Diagnosis not present

## 2019-11-09 DIAGNOSIS — I10 Essential (primary) hypertension: Secondary | ICD-10-CM | POA: Diagnosis not present

## 2019-11-09 DIAGNOSIS — G8929 Other chronic pain: Secondary | ICD-10-CM | POA: Insufficient documentation

## 2019-11-09 MED ORDER — GLIMEPIRIDE 1 MG PO TABS: 0.5000 mg | ORAL_TABLET | Freq: Two times a day (BID) | ORAL | 1 refills | Status: DC

## 2019-11-09 MED ORDER — MELOXICAM 7.5 MG PO TABS: ORAL_TABLET | ORAL | 2 refills | Status: DC

## 2019-11-09 MED ORDER — AMLODIPINE BESYLATE 10 MG PO TABS: ORAL_TABLET | ORAL | 1 refills | Status: DC

## 2019-11-09 MED ORDER — OLMESARTAN MEDOXOMIL 20 MG PO TABS: 20.0000 mg | ORAL_TABLET | Freq: Every day | ORAL | 1 refills | Status: DC

## 2019-11-09 NOTE — Patient Instructions (Signed)
° ° ° °  If you have lab work done today you will be contacted with your lab results within the next 2 weeks.  If you have not heard from us then please contact us. The fastest way to get your results is to register for My Chart. ° ° °IF you received an x-ray today, you will receive an invoice from [facility]. Please contact [facility] at [phone] with questions or concerns regarding your invoice.  ° °IF you received labwork today, you will receive an invoice from [facility]. Please contact [facility] at [phone] with questions or concerns regarding your invoice.  ° °Our billing staff will not be able to assist you with questions regarding bills from these companies. ° °You will be contacted with the lab results as soon as they are available. The fastest way to get your results is to activate your My Chart account. Instructions are located on the last page of this paperwork. If you have not heard from us regarding the results in 2 weeks, please contact this office. °  ° ° ° °

## 2019-11-09 NOTE — Progress Notes (Signed)
11/19/202010:03 AM  Robin Andrews [birth year], 85 y.o., female [id number]  Chief Complaint  Patient presents with  . Follow-up    pain, taking meloxicam for pain. Taking half tab. Did not likw how whole tab made her feel    HPI:   Patient is a 85 y.o. female with past medical history significant for HTN, DM2, chronic neck and low back pain who presents today for routine followup  Saw Dr Mitchel Honour in oct 2020 for neck, low back pain Started meloxicam prn  Her grandson serves as interpreter She is overall doing well meloxicam helping, taking 1/2 tab in the morning as needed  Lab Results  Component Value Date   HGBA1C 4.9 02/22/2019   Lab Results  Component Value Date   CREATININE 0.89 02/22/2019   BUN 14 02/22/2019   NA 138 02/22/2019   K 4.5 02/22/2019   CL 96 02/22/2019   CO2 26 02/22/2019    Depression screen PHQ 2/9 11/09/2019 10/11/2019 07/14/2019  Decreased Interest 0 0 0  Down, Depressed, Hopeless 0 0 0  PHQ - 2 Score 0 0 0    Fall Risk  11/09/2019 10/11/2019 07/14/2019 07/03/2019 06/16/2019  Falls in the past year? 0 1 0 0 0  Number falls in past yr: 0 0 0 0 0  Injury with Fall? 0 1 0 0 0  Comment - left foot and ankle - - -  Follow up - Falls evaluation completed Falls evaluation completed - -     Allergies  Allergen Reactions  . Hydrochlorothiazide     Severe hyponatremia resulting in hospitalization  . Lisinopril     Prior to Admission medications   Medication Sig Start Date End Date Taking? Authorizing Provider  albuterol (PROVENTIL HFA;VENTOLIN HFA) 108 (90 Base) MCG/ACT inhaler Inhale 2 puffs into the lungs every 6 (six) hours as needed for wheezing or shortness of breath. 01/12/19  Yes Rutherford Guys, MD  amLODipine (NORVASC) 10 MG tablet TAKE 1 TABLET(10 MG) BY MOUTH DAILY 07/14/19  Yes Rutherford Guys, MD  Blood Pressure Monitoring (BLOOD PRESSURE KIT) DEVI 1 Device by Does not apply route daily. 10/17/18  Yes Timmothy Euler, [place] D, PA-C   glimepiride (AMARYL) 1 MG tablet Take 0.5 tablets (0.5 mg total) by mouth 2 (two) times daily. 07/14/19  Yes Rutherford Guys, MD  meloxicam (MOBIC) 7.5 MG tablet TAKE 1 TABLET(7.5 MG) BY MOUTH DAILY AS NEEDED FOR PAIN 10/23/19  Yes Sagardia, Ines Bloomer, MD  olmesartan (BENICAR) 20 MG tablet Take 1 tablet (20 mg total) by mouth daily. 07/14/19  Yes Rutherford Guys, MD    Past Medical History:  Diagnosis Date  . Degenerative disc disease   . Diabetes mellitus without complication ([place])   . Hypertension   . Pruritus     Past Surgical History:  Procedure Laterality Date  . ANTERIOR CERVICAL DECOMP/DISCECTOMY FUSION  02/2010   C3-6/notes 03/09/2010  . BACK SURGERY    . DILATION AND CURETTAGE, DIAGNOSTIC / THERAPEUTIC      Social History   Tobacco Use  . Smoking status: Never Smoker  . Smokeless tobacco: Never Used  Substance Use Topics  . Alcohol use: No    Family History  Family history unknown: Yes    Review of Systems  Constitutional: Negative for chills and fever.  Respiratory: Negative for cough and shortness of breath.   Cardiovascular: Negative for chest pain, palpitations and leg swelling.  Gastrointestinal: Negative for abdominal pain, blood in stool, diarrhea, heartburn,  melena, nausea and vomiting.     OBJECTIVE:  Today's Vitals   11/09/19 0957  BP: 134/71  Pulse: 78  Temp: 98.3 F (36.8 C)  SpO2: 97%  Weight: 100 lb (45.4 kg)  Height: 4' 11" (1.499 m)   Body mass index is 20.2 kg/m.   Physical Exam Vitals signs and nursing note reviewed.  Constitutional:      Appearance: She is well-developed.  HENT:     Head: Normocephalic and atraumatic.     Mouth/Throat:     Pharynx: No oropharyngeal exudate.  Eyes:     General: No scleral icterus.    Conjunctiva/sclera: Conjunctivae normal.     Pupils: Pupils are equal, round, and reactive to light.  Neck:     Musculoskeletal: Neck supple.  Cardiovascular:     Rate and Rhythm: Normal rate and regular  rhythm.     Heart sounds: Normal heart sounds. No murmur. No friction rub. No gallop.   Pulmonary:     Effort: Pulmonary effort is normal.     Breath sounds: Normal breath sounds. No wheezing or rales.  Musculoskeletal:     Right lower leg: No edema.     Left lower leg: No edema.  Skin:    General: Skin is warm and dry.  Neurological:     Mental Status: She is alert and oriented to person, place, and time.     No results found for this or any previous visit (from the past 24 hour(s)).  No results found.   ASSESSMENT and PLAN  1. Controlled type 2 diabetes mellitus without complication, without long-term current use of insulin ([place]) Controlled per last a1c, labs pending. Cont current regime, patient very reluctant to stop meds - Ambulatory referral to Ophthalmology - CMET with GFR - Hemoglobin A1c - glimepiride (AMARYL) 1 MG tablet; Take 0.5 tablets (0.5 mg total) by mouth 2 (two) times daily.  2. Hypertension associated with type 2 diabetes mellitus ([place]) Controlled. Continue current regime.  - CMET with GFR  3. Chronic neck pain 4. Chronic bilateral low back pain without sciatica Stable. Cont meloxicam prn, reviewed r/se/b  5. Need for vaccination - Prevnar 13  Other orders - amLODipine (NORVASC) 10 MG tablet; TAKE 1 TABLET(10 MG) BY MOUTH DAILY - meloxicam (MOBIC) 7.5 MG tablet; TAKE 1 TABLET(7.5 MG) BY MOUTH DAILY AS NEEDED FOR PAIN - olmesartan (BENICAR) 20 MG tablet; Take 1 tablet (20 mg total) by mouth daily.  Return in about 6 months (around 05/08/2020).    Rutherford Guys, MD [facility] [place], [place] [address] Ph.  [phone] Fax [phone]

## 2019-11-10 LAB — CMP14+EGFR
ALT: 18 IU/L (ref 0–32)
AST: 27 IU/L (ref 0–40)
Albumin/Globulin Ratio: 2 (ref 1.2–2.2)
Albumin: 4.7 g/dL — ABNORMAL HIGH (ref 3.6–4.6)
Alkaline Phosphatase: 80 IU/L (ref 39–117)
BUN/Creatinine Ratio: 22 (ref 12–28)
BUN: 22 mg/dL (ref 8–27)
Bilirubin Total: 0.4 mg/dL (ref 0.0–1.2)
CO2: 23 mmol/L (ref 20–29)
Calcium: 9.6 mg/dL (ref 8.7–10.3)
Chloride: 93 mmol/L — ABNORMAL LOW (ref 96–106)
Creatinine, Ser: 1.01 mg/dL — ABNORMAL HIGH (ref 0.57–1.00)
GFR calc Af Amer: 58 mL/min/{1.73_m2} — ABNORMAL LOW (ref >59)
GFR calc non Af Amer: 51 mL/min/{1.73_m2} — ABNORMAL LOW (ref >59)
Globulin, Total: 2.4 g/dL (ref 1.5–4.5)
Glucose: 173 mg/dL — ABNORMAL HIGH (ref 65–99)
Potassium: 3.9 mmol/L (ref 3.5–5.2)
Sodium: 136 mmol/L (ref 134–144)
Total Protein: 7.1 g/dL (ref 6.0–8.5)

## 2019-11-10 LAB — HEMOGLOBIN A1C
Est. average glucose Bld gHb Est-mCnc: 94 mg/dL
Hgb A1c MFr Bld: 4.9 % (ref 4.8–5.6)

## 2019-11-13 ENCOUNTER — Encounter: Payer: Self-pay | Admitting: Radiology

## 2019-12-24 ENCOUNTER — Other Ambulatory Visit: Payer: Self-pay | Admitting: Family Medicine

## 2020-01-10 DIAGNOSIS — Z961 Presence of intraocular lens: Secondary | ICD-10-CM | POA: Diagnosis not present

## 2020-01-10 DIAGNOSIS — E119 Type 2 diabetes mellitus without complications: Secondary | ICD-10-CM | POA: Diagnosis not present

## 2020-01-10 DIAGNOSIS — H179 Unspecified corneal scar and opacity: Secondary | ICD-10-CM | POA: Diagnosis not present

## 2020-01-11 ENCOUNTER — Other Ambulatory Visit: Payer: Self-pay | Admitting: Family Medicine

## 2020-01-15 ENCOUNTER — Other Ambulatory Visit: Payer: Self-pay | Admitting: Family Medicine

## 2020-01-15 ENCOUNTER — Ambulatory Visit: Payer: Medicare Other | Admitting: Family Medicine

## 2020-01-15 NOTE — Telephone Encounter (Signed)
Spoke with pharmacy and they have a script on file that they will get ready for patient . They will notify patient

## 2020-01-15 NOTE — Telephone Encounter (Signed)
Medication Refill - Medication:   olmesartan (BENICAR) 20 MG tablet [[id number]]     Preferred Pharmacy (with phone number or street name):  [id number] [place], [place] - [address] AT [facility] & [address]  [address] [place] [address]  Phone: [phone] Fax: [phone]  Not a 24 hour pharmacy; exact hours not known     Agent: Please be advised that RX refills may take up to 3 business days. We ask that you follow-up with your pharmacy.

## 2020-02-01 ENCOUNTER — Encounter: Payer: Self-pay | Admitting: Family Medicine

## 2020-02-01 ENCOUNTER — Ambulatory Visit (INDEPENDENT_AMBULATORY_CARE_PROVIDER_SITE_OTHER): Payer: Medicare Other | Admitting: Family Medicine

## 2020-02-01 ENCOUNTER — Other Ambulatory Visit: Payer: Self-pay

## 2020-02-01 VITALS — BP 112/64 | HR 74 | Temp 98.3°F | Ht 59.0 in | Wt 99.2 lb

## 2020-02-01 DIAGNOSIS — L853 Xerosis cutis: Secondary | ICD-10-CM | POA: Diagnosis not present

## 2020-02-01 MED ORDER — HYDROXYZINE HCL 10 MG PO TABS: 10.0000 mg | ORAL_TABLET | Freq: Three times a day (TID) | ORAL | 0 refills | Status: DC | PRN

## 2020-02-01 NOTE — Patient Instructions (Signed)
° ° ° °  If you have lab work done today you will be contacted with your lab results within the next 2 weeks.  If you have not heard from us then please contact us. The fastest way to get your results is to register for My Chart. ° ° °IF you received an x-ray today, you will receive an invoice from [facility]. Please contact [facility] at [phone] with questions or concerns regarding your invoice.  ° °IF you received labwork today, you will receive an invoice from [facility]. Please contact [facility] at [phone] with questions or concerns regarding your invoice.  ° °Our billing staff will not be able to assist you with questions regarding bills from these companies. ° °You will be contacted with the lab results as soon as they are available. The fastest way to get your results is to activate your My Chart account. Instructions are located on the last page of this paperwork. If you have not heard from us regarding the results in 2 weeks, please contact this office. °  ° ° ° °

## 2020-02-01 NOTE — Progress Notes (Signed)
2/11/20219:23 AM  Robin Andrews 1931-08-28, 86 y.o., female [id number]  Chief Complaint  Patient presents with  . Referral    derm referral, for 1 month has been having lots of itching. Using regular lotion. Not taking the mobic, makes itching worse. Requesting stat referral due to the itch    HPI:   Patient is a 86 y.o. female with past medical history significant for DM2 and HTN who presents today for generalized itchiness  Patient has been having very dry itchy skin for months Has been using cerave 3 times a day for past month wo much benefit Drinks about 50 ounces of water a day Takes 2 hot showers a day No new detergents or soaps No rash Requesting referral to derm  Yolanda Bonine serves as interpreter  Depression screen [facility] 2/9 02/01/2020 11/09/2019 10/11/2019  Decreased Interest 0 0 0  Down, Depressed, Hopeless 0 0 0  PHQ - 2 Score 0 0 0    Fall Risk  02/01/2020 11/09/2019 10/11/2019 07/14/2019 07/03/2019  Falls in the past year? 0 0 1 0 0  Number falls in past yr: 0 0 0 0 0  Injury with Fall? 0 0 1 0 0  Comment - - left foot and ankle - -  Follow up - - Falls evaluation completed Falls evaluation completed -     Allergies  Allergen Reactions  . Hydrochlorothiazide     Severe hyponatremia resulting in hospitalization  . Lisinopril     Prior to Admission medications   Medication Sig Start Date End Date Taking? Authorizing Provider  albuterol (PROVENTIL HFA;VENTOLIN HFA) 108 (90 Base) MCG/ACT inhaler Inhale 2 puffs into the lungs every 6 (six) hours as needed for wheezing or shortness of breath. 01/12/19  Yes Rutherford Guys, MD  amLODipine (NORVASC) 10 MG tablet TAKE 1 TABLET(10 MG) BY MOUTH DAILY 11/09/19  Yes Rutherford Guys, MD  Blood Pressure Monitoring (BLOOD PRESSURE KIT) DEVI 1 Device by Does not apply route daily. 10/17/18  Yes Timmothy Euler, [place] D, PA-C  glimepiride (AMARYL) 1 MG tablet Take 0.5 tablets (0.5 mg total) by mouth 2 (two) times daily. 11/09/19   Yes Rutherford Guys, MD  olmesartan (BENICAR) 20 MG tablet Take 1 tablet (20 mg total) by mouth daily. 11/09/19  Yes Rutherford Guys, MD  FLUZONE HIGH-DOSE QUADRIVALENT 0.7 ML SUSY  09/26/19   [provider]  meloxicam (MOBIC) 7.5 MG tablet TAKE 1 TABLET(7.5 MG) BY MOUTH DAILY AS NEEDED FOR PAIN Patient not taking: Reported on 02/01/2020 12/24/19   Rutherford Guys, MD  White Petrolatum-Mineral Oil (SYSTANE NIGHTTIME) OINT SMARTSIG:1 Inch(es) In Eye(s) Every Night 01/10/20   [provider]    Past Medical History:  Diagnosis Date  . Degenerative disc disease   . Diabetes mellitus without complication ([place])   . Hypertension   . Pruritus     Past Surgical History:  Procedure Laterality Date  . ANTERIOR CERVICAL DECOMP/DISCECTOMY FUSION  02/2010   C3-6/notes 03/09/2010  . BACK SURGERY    . DILATION AND CURETTAGE, DIAGNOSTIC / THERAPEUTIC      Social History   Tobacco Use  . Smoking status: Never Smoker  . Smokeless tobacco: Never Used  Substance Use Topics  . Alcohol use: No    Family History  Family history unknown: Yes    ROS Per hpi  OBJECTIVE:  Today's Vitals   02/01/20 0858  BP: 112/64  Pulse: 74  Temp: 98.3 F (36.8 C)  SpO2: 98%  Weight:  99 lb 3.2 oz (45 kg)  Height: 4' 11"  (1.499 m)   Body mass index is 20.04 kg/m.   Physical Exam Vitals and nursing note reviewed.  Constitutional:      Appearance: She is well-developed.  HENT:     Head: Normocephalic and atraumatic.  Eyes:     General: No scleral icterus.    Conjunctiva/sclera: Conjunctivae normal.     Pupils: Pupils are equal, round, and reactive to light.  Pulmonary:     Effort: Pulmonary effort is normal.  Musculoskeletal:     Cervical back: Neck supple.  Skin:    General: Skin is warm and dry.     Comments: Generalized dry thin skin, no rash or lesions noted  Neurological:     Mental Status: She is alert and oriented to person, place, and time.     No results found  for this or any previous visit (from the past 24 hour(s)).  No results found.   ASSESSMENT and PLAN  1. Dry skin dermatitis R/o hypothyroidism. Discussed supportive measures, increase water intake, cut back on showers, luke warm only, use of after shower oils, vistaril prn reviewed r/se/b. Referring to derm as requested.  - TSH - Ambulatory referral to Dermatology - T4, Free  Other orders - hydrOXYzine (ATARAX/VISTARIL) 10 MG tablet; Take 1 tablet (10 mg total) by mouth 3 (three) times daily as needed for itching.  Return if symptoms worsen or fail to improve.    Rutherford Guys, MD Primary Care at [facility] [place], [place] [address] Ph.  [phone] Fax [phone]

## 2020-02-02 ENCOUNTER — Telehealth: Payer: Self-pay | Admitting: Family Medicine

## 2020-02-02 LAB — T4, FREE: Free T4: 1.37 ng/dL (ref 0.82–1.77)

## 2020-02-02 LAB — TSH: TSH: 0.585 u[IU]/mL (ref 0.450–4.500)

## 2020-02-02 NOTE — Telephone Encounter (Signed)
Pt had an appointment on 02/01/2020 Husband calling to request for the prescription to be resent the pharmacy is saying that they did not receive anything.  Pt husband asking for call back when resent for him to follow up with pharmacy   Please advise

## 2020-02-02 NOTE — Telephone Encounter (Signed)
Spoke with husband to let him know that the medication is at the pharmacy waiting for pick up.

## 2020-02-28 DIAGNOSIS — L299 Pruritus, unspecified: Secondary | ICD-10-CM | POA: Diagnosis not present

## 2020-04-18 ENCOUNTER — Ambulatory Visit (INDEPENDENT_AMBULATORY_CARE_PROVIDER_SITE_OTHER): Payer: Medicare Other | Admitting: Adult Health Nurse Practitioner

## 2020-04-18 ENCOUNTER — Other Ambulatory Visit: Payer: Self-pay

## 2020-04-18 ENCOUNTER — Encounter: Payer: Self-pay | Admitting: Adult Health Nurse Practitioner

## 2020-04-18 VITALS — BP 136/61 | HR 75 | Temp 97.6°F | Resp 16 | Ht 59.0 in | Wt 99.6 lb

## 2020-04-18 DIAGNOSIS — H608X1 Other otitis externa, right ear: Secondary | ICD-10-CM | POA: Diagnosis not present

## 2020-04-18 DIAGNOSIS — R42 Dizziness and giddiness: Secondary | ICD-10-CM

## 2020-04-18 MED ORDER — HYDROCORTISONE-ACETIC ACID 1-2 % OT SOLN
3.0000 [drp] | Freq: Two times a day (BID) | OTIC | 0 refills | Status: DC
Start: 2020-04-18 — End: 2021-08-12

## 2020-04-18 MED ORDER — MECLIZINE HCL 25 MG PO TABS: 25.0000 mg | ORAL_TABLET | Freq: Three times a day (TID) | ORAL | 0 refills | Status: DC | PRN

## 2020-04-18 NOTE — Progress Notes (Signed)
Subjective:    Robin Andrews is a 86 y.o. female  The dizziness has been present for 2 weeks. The patient describes the symptoms as disequalibirum and vertigo. Symptoms are exacerbated by rapid head movements and rolling over in bed The patient also complains of otalgia. Patient denies otorrhea tinnitus hearing loss.  Reports that the 1st week of symptoms, could barely get out of bed.  Some improvement this week where she can move with less dizziness but still feels unbalanced on feet.   She has been treated with nothing so far.  Was seen in 2018 for vertigo and Meclizine was effective for improving.  The following portions of the patient's history were reviewed and updated as appropriate: allergies, current medications, past family history, past social history, past surgical history and problem list.  Review of Systems A comprehensive review of systems was negative except for: Ears, nose, mouth, throat, and face: positive for earaches    Objective:    BP 136/61 (BP Location: Right Arm, Patient Position: Sitting, Cuff Size: Normal)   Pulse 75   Temp 97.6 F (36.4 C) (Temporal)   Resp 16   Ht 4\' 11"  (1.499 m)   Wt 99 lb 9.6 oz (45.2 kg)   SpO2 98%   BMI 20.12 kg/m    General:   healthy  Head and Face:   facial movement was normal and symmetrical, nontender  External Ears:   normal pinnae shape and position  Ext. Aud. Canal:  Right:inflamed, swollen   Left: patent   Tympanic Mem:  Right: dull and retracted  Left: normal landmarks and mobility  Nose:  Nares normal. Septum midline. Mucosa normal. No drainage or sinus tenderness.    Assessment:    Benign positional vertigo    Plan:    Meds ordered this encounter  Medications  . meclizine (ANTIVERT) 25 MG tablet    Sig: Take 1 tablet (25 mg total) by mouth 3 (three) times daily as needed for dizziness.    Dispense:  30 tablet    Refill:  0  . acetic acid-hydrocortisone (VOSOL-HC) OTIC solution    Sig: Place 3 drops into the  left ear 2 (two) times daily.    Dispense:  10 mL    Refill:  0   F/u if symptoms do not improve.  Translator aided in explaining Modified Epley maneuvers and patient given handout with graphic instructions.  Verbalized understanding.  , NP

## 2020-04-18 NOTE — Patient Instructions (Signed)
° ° ° °  If you have lab work done today you will be contacted with your lab results within the next 2 weeks.  If you have not heard from us then please contact us. The fastest way to get your results is to register for My Chart. ° ° °IF you received an x-ray today, you will receive an invoice from [facility]. Please contact [facility] at [phone] with questions or concerns regarding your invoice.  ° °IF you received labwork today, you will receive an invoice from [facility]. Please contact [facility] at [phone] with questions or concerns regarding your invoice.  ° °Our billing staff will not be able to assist you with questions regarding bills from these companies. ° °You will be contacted with the lab results as soon as they are available. The fastest way to get your results is to activate your My Chart account. Instructions are located on the last page of this paperwork. If you have not heard from us regarding the results in 2 weeks, please contact this office. °  ° ° ° °

## 2020-05-09 ENCOUNTER — Ambulatory Visit: Payer: Medicare Other | Admitting: Family Medicine

## 2020-05-10 ENCOUNTER — Ambulatory Visit: Payer: Medicare Other | Admitting: Family Medicine

## 2020-05-13 ENCOUNTER — Encounter: Payer: Self-pay | Admitting: Family Medicine

## 2020-05-14 ENCOUNTER — Other Ambulatory Visit: Payer: Self-pay | Admitting: Family Medicine

## 2020-05-14 MED ORDER — AMLODIPINE BESYLATE 10 MG PO TABS: ORAL_TABLET | ORAL | 0 refills | Status: DC

## 2020-05-14 NOTE — Telephone Encounter (Signed)
RX REFILL amLODipine (NORVASC) 10 MG tablet [[id number]  PHARMACY   [id number] [place], [place] - [address] AT [facility] [address] [facility] [address] Phone:  [phone]  Fax:  [phone]

## 2020-06-13 ENCOUNTER — Other Ambulatory Visit: Payer: Self-pay | Admitting: Family Medicine

## 2020-06-22 ENCOUNTER — Other Ambulatory Visit: Payer: Self-pay | Admitting: Family Medicine

## 2020-06-22 DIAGNOSIS — E119 Type 2 diabetes mellitus without complications: Secondary | ICD-10-CM

## 2020-06-22 NOTE — Telephone Encounter (Signed)
Requested Prescriptions  Pending Prescriptions Disp Refills  . glimepiride (AMARYL) 1 MG tablet [Pharmacy Med Name: GLIMEPIRIDE 1MG  TABLETS] 90 tablet 0    Sig: TAKE 1/2 TABLET(0.5 MG) BY MOUTH TWICE DAILY     Endocrinology:  Diabetes - Sulfonylureas Failed - 06/22/2020 10:14 AM      Failed - HBA1C is between 0 and 7.9 and within 180 days    Hgb A1c MFr Bld  Date Value Ref Range Status  11/09/2019 4.9 4.8 - 5.6 % Final    Comment:             Prediabetes: 5.7 - 6.4          Diabetes: >6.4          Glycemic control for adults with diabetes: <7.0          Passed - Valid encounter within last 6 months    Recent Outpatient Visits          2 months ago Vertigo   Primary Care at [facility], [facility], NP   4 months ago Dry skin dermatitis   Primary Care at [facility], Oneita Jolly, MD   7 months ago Controlled type 2 diabetes mellitus without complication, without long-term current use of insulin [facility])   Primary Care at [facility], Oneita Jolly, MD   8 months ago Chronic neck pain   Primary Care at [facility], [facility], MD   11 months ago HYPERTENSION, BENIGN ESSENTIAL   [facility] Care at [facility], Oneita Jolly, MD      Future Appointments            In 5 days Meda Coffee, MD Primary Care at [place], [facility]

## 2020-06-27 ENCOUNTER — Other Ambulatory Visit: Payer: Self-pay

## 2020-06-27 ENCOUNTER — Ambulatory Visit (INDEPENDENT_AMBULATORY_CARE_PROVIDER_SITE_OTHER): Payer: Medicare Other | Admitting: Family Medicine

## 2020-06-27 ENCOUNTER — Encounter: Payer: Self-pay | Admitting: Family Medicine

## 2020-06-27 ENCOUNTER — Ambulatory Visit (INDEPENDENT_AMBULATORY_CARE_PROVIDER_SITE_OTHER): Payer: Medicare Other

## 2020-06-27 VITALS — BP 120/61 | HR 68 | Temp 97.9°F | Resp 15 | Ht 59.0 in | Wt 102.4 lb

## 2020-06-27 DIAGNOSIS — R42 Dizziness and giddiness: Secondary | ICD-10-CM | POA: Diagnosis not present

## 2020-06-27 DIAGNOSIS — G8929 Other chronic pain: Secondary | ICD-10-CM

## 2020-06-27 DIAGNOSIS — M542 Cervicalgia: Secondary | ICD-10-CM

## 2020-06-27 DIAGNOSIS — E871 Hypo-osmolality and hyponatremia: Secondary | ICD-10-CM | POA: Diagnosis not present

## 2020-06-27 DIAGNOSIS — E119 Type 2 diabetes mellitus without complications: Secondary | ICD-10-CM

## 2020-06-27 DIAGNOSIS — I10 Essential (primary) hypertension: Secondary | ICD-10-CM

## 2020-06-27 MED ORDER — OLMESARTAN MEDOXOMIL 20 MG PO TABS: ORAL_TABLET | ORAL | 1 refills | Status: DC

## 2020-06-27 MED ORDER — AMLODIPINE BESYLATE 10 MG PO TABS: ORAL_TABLET | ORAL | 1 refills | Status: DC

## 2020-06-27 MED ORDER — GLIMEPIRIDE 1 MG PO TABS: ORAL_TABLET | ORAL | 0 refills | Status: DC

## 2020-06-27 NOTE — Progress Notes (Signed)
7/8/20214:16 PM  Robin Andrews 03/08/1931, 86 y.o., female [id number]  Chief Complaint  Patient presents with  . Hypertension    pt has been doing well on her doses no side effects, no physical symptoms. pt needs medications refilled  . Neck Pain    pt has been having neck pain for an extended period of time with no releif ibuprofen no longer helping the pain    HPI:   Patient is a 86 y.o. female with past medical history significant for HTN, DM2, neck pain who presents today for routine followup  Patient reports overall doing well except for worsening neck pain She takes her amlodipine and olmesartan daily wo issues She takes glimperide as rx, refuses to stop taking despite normal a1c She does not check her sugars She denies any symptoms of hypoglycemia  She occasionally gets dizziness, room spinning Triggered by looking to the right or lying back Last 3-4 minutes Lying on her left resolves the episode  Having neck pain for several months now Feels sore throbbing pain, increases in intensity She denies any numbness or tingling or weakness Pain mostly with looking up H/o C3-6 ACDF Uses salnopas which provides partial relief  Grandson serves as interpreter per patient preference  Lab Results  Component Value Date   HGBA1C 4.9 11/09/2019   HGBA1C 4.9 02/22/2019   HGBA1C 5.4 08/02/2018   Lab Results  Component Value Date   LDLCALC 84 02/22/2019   CREATININE 1.01 (H) 11/09/2019    Depression screen PHQ 2/9 06/27/2020 04/18/2020 02/01/2020  Decreased Interest 0 0 0  Down, Depressed, Hopeless 0 0 0  PHQ - 2 Score 0 0 0    Fall Risk  06/27/2020 04/18/2020 02/01/2020 11/09/2019 10/11/2019  Falls in the past year? 0 0 0 0 1  Number falls in past yr: - 0 0 0 0  Injury with Fall? - 0 0 0 1  Comment - - - - left foot and ankle  Follow up Falls evaluation completed Falls evaluation completed - - Falls evaluation completed     Allergies  Allergen Reactions  .  Hydrochlorothiazide     Severe hyponatremia resulting in hospitalization  . Lisinopril     Prior to Admission medications   Medication Sig Start Date End Date Taking? Authorizing Provider  acetic acid-hydrocortisone (VOSOL-HC) OTIC solution Place 3 drops into the left ear 2 (two) times daily. 04/18/20  Yes Wendall Mola, NP  albuterol (PROVENTIL HFA;VENTOLIN HFA) 108 (90 Base) MCG/ACT inhaler Inhale 2 puffs into the lungs every 6 (six) hours as needed for wheezing or shortness of breath. 01/12/19  Yes Rutherford Guys, MD  amLODipine (NORVASC) 10 MG tablet TAKE 1 TABLET(10 MG) BY MOUTH DAILY 05/14/20  Yes Rutherford Guys, MD  Blood Pressure Monitoring (BLOOD PRESSURE KIT) DEVI 1 Device by Does not apply route daily. 10/17/18  Yes Timmothy Euler, [place] D, PA-C  FLUZONE HIGH-DOSE QUADRIVALENT 0.7 ML SUSY  09/26/19  Yes [provider]  glimepiride (AMARYL) 1 MG tablet TAKE 1/2 TABLET(0.5 MG) BY MOUTH TWICE DAILY 06/22/20  Yes Rutherford Guys, MD  hydrOXYzine (ATARAX/VISTARIL) 10 MG tablet Take 1 tablet (10 mg total) by mouth 3 (three) times daily as needed for itching. 02/01/20  Yes Rutherford Guys, MD  meclizine (ANTIVERT) 25 MG tablet Take 1 tablet (25 mg total) by mouth 3 (three) times daily as needed for dizziness. 04/18/20  Yes Wendall Mola, NP  meloxicam (MOBIC) 7.5 MG tablet TAKE 1 TABLET(7.5 MG)  BY MOUTH DAILY AS NEEDED FOR PAIN 12/24/19  Yes Rutherford Guys, MD  olmesartan (BENICAR) 20 MG tablet TAKE 1 TABLET(20 MG) BY MOUTH DAILY 06/13/20  Yes Rutherford Guys, MD  White Petrolatum-Mineral Oil (SYSTANE NIGHTTIME) OINT SMARTSIG:1 Inch(es) In Eye(s) Every Night 01/10/20  Yes [provider]    Past Medical History:  Diagnosis Date  . Degenerative disc disease   . Diabetes mellitus without complication ([place])   . Hypertension   . Pruritus     Past Surgical History:  Procedure Laterality Date  . ANTERIOR CERVICAL DECOMP/DISCECTOMY FUSION  02/2010   C3-6/notes  03/09/2010  . BACK SURGERY    . DILATION AND CURETTAGE, DIAGNOSTIC / THERAPEUTIC      Social History   Tobacco Use  . Smoking status: Never Smoker  . Smokeless tobacco: Never Used  Substance Use Topics  . Alcohol use: No    Family History  Family history unknown: Yes    Review of Systems  Constitutional: Negative for chills and fever.  Respiratory: Negative for cough and shortness of breath.   Cardiovascular: Negative for chest pain, palpitations and leg swelling.  Gastrointestinal: Negative for abdominal pain, nausea and vomiting.   Per hpi  OBJECTIVE:  Today's Vitals   06/27/20 1605  BP: 120/61  Pulse: 68  Resp: 15  Temp: 97.9 F (36.6 C)  TempSrc: Temporal  SpO2: 98%  Weight: 102 lb 6.4 oz (46.4 kg)  Height: '4\' 11"'$  (1.499 m)   Body mass index is 20.68 kg/m.   Physical Exam Vitals and nursing note reviewed.  Constitutional:      Appearance: She is well-developed.  HENT:     Head: Normocephalic and atraumatic.     Mouth/Throat:     Pharynx: No oropharyngeal exudate.  Eyes:     General: No scleral icterus.    Conjunctiva/sclera: Conjunctivae normal.     Pupils: Pupils are equal, round, and reactive to light.  Cardiovascular:     Rate and Rhythm: Normal rate and regular rhythm.     Heart sounds: Normal heart sounds. No murmur heard.  No friction rub. No gallop.   Pulmonary:     Effort: Pulmonary effort is normal.     Breath sounds: Normal breath sounds. No wheezing or rales.  Musculoskeletal:     Cervical back: Neck supple. Pain with movement (extension) and spinous process tenderness present. No muscular tenderness. Normal range of motion.  Skin:    General: Skin is warm and dry.  Neurological:     Mental Status: She is alert and oriented to person, place, and time.     No results found for this or any previous visit (from the past 24 hour(s)).  No results found.   ASSESSMENT and PLAN  1. HYPERTENSION, BENIGN ESSENTIAL Controlled.  Continue current regime.  - Comprehensive metabolic panel  2. Controlled type 2 diabetes mellitus without complication, without long-term current use of insulin ([facility]) Controlled. Cont current meds, denies hypoglycemia. - Hemoglobin A1c - glimepiride (AMARYL) 1 MG tablet; TAKE 1/2 TABLET(0.5 MG) BY MOUTH TWICE DAILY  3. Chronic neck pain - DG Cervical Spine Complete  - official read pending  4. Vertigo BPPV, discussed supportive measures  Other orders - amLODipine (NORVASC) 10 MG tablet; TAKE 1 TABLET(10 MG) BY MOUTH DAILY - olmesartan (BENICAR) 20 MG tablet; TAKE 1 TABLET(20 MG) BY MOUTH DAILY  Return in about 6 months (around 12/28/2020).    Rutherford Guys, MD Primary Care at [place],  [place] [address] Ph.  [phone] Fax [phone]

## 2020-06-27 NOTE — Patient Instructions (Addendum)
Try tylenol arthritis 650mg  take 1 tab every 8 hours as needed.     If you have lab work done today you will be contacted with your lab results within the next 2 weeks.  If you have not heard from then please contact us. The fastest way to get your results is to register for My Chart.   IF you received an x-ray today, you will receive an invoice from [facility]. Please contact [facility] at [phone] with questions or concerns regarding your invoice.   IF you received labwork today, you will receive an invoice from [facility]. Please contact [facility] at [phone] with questions or concerns regarding your invoice.   Our billing staff will not be able to assist you with questions regarding bills from these companies.  You will be contacted with the lab results as soon as they are available. The fastest way to get your results is to activate your My Chart account. Instructions are located on the last page of this paperwork. If you have not heard from [phone] regarding the results in 2 weeks, please contact this office.    How to Perform the Epley Maneuver The Epley maneuver is an exercise that relieves symptoms of vertigo. Vertigo is the feeling that you or your surroundings are moving when they are not. When you feel vertigo, you may feel like the room is spinning and have trouble walking. Dizziness is a little different than vertigo. When you are dizzy, you may feel unsteady or light-headed. You can do this maneuver at home whenever you have symptoms of vertigo. You can do it up to 3 times a day until your symptoms go away. Even though the Epley maneuver may relieve your vertigo for a few weeks, it is possible that your symptoms will return. This maneuver relieves vertigo, but it does not relieve dizziness. What are the risks? If it is done correctly, the Epley maneuver is considered safe. Sometimes it can lead to dizziness or nausea that goes away after a short time. If you  develop other symptoms, such as changes in vision, weakness, or numbness, stop doing the maneuver and call your health care provider. How to perform the Epley maneuver 1. Sit on the edge of a bed or table with your back straight and your legs extended or hanging over the edge of the bed or table. 2. Turn your head halfway toward the affected ear or side. 3. Lie backward quickly with your head turned until you are lying flat on your back. You may want to position a pillow under your shoulders. 4. Hold this position for 30 seconds. You may experience an attack of vertigo. This is normal. 5. Turn your head to the opposite direction until your unaffected ear is facing the floor. 6. Hold this position for 30 seconds. You may experience an attack of vertigo. This is normal. Hold this position until the vertigo stops. 7. Turn your whole body to the same side as your head. Hold for another 30 seconds. 8. Sit back up. You can repeat this exercise up to 3 times a day. Follow these instructions at home:  After doing the Epley maneuver, you can return to your normal activities.  Ask your health care provider if there is anything you should do at home to prevent vertigo. He or she may recommend that you: ? Keep your head raised (elevated) with two or more pillows while you sleep. ? Do not sleep on the side of your affected ear. ?  Get up slowly from bed. ? Avoid sudden movements during the day. ? Avoid extreme head movement, like looking up or bending over. Contact a health care provider if:  Your vertigo gets worse.  You have other symptoms, including: ? Nausea. ? Vomiting. ? Headache. Get help right away if:  You have vision changes.  You have a severe or worsening headache or neck pain.  You cannot stop vomiting.  You have new numbness or weakness in any part of your body. Summary  Vertigo is the feeling that you or your surroundings are moving when they are not.  The Epley maneuver is  an exercise that relieves symptoms of vertigo.  If the Epley maneuver is done correctly, it is considered safe. You can do it up to 3 times a day. This information is not intended to replace advice given to you by your health care provider. Make sure you discuss any questions you have with your health care provider. Document Revised: 11/19/2017 Document Reviewed: 10/27/2016 Elsevier Patient Education  2020 [employer].

## 2020-06-28 LAB — HEMOGLOBIN A1C
Est. average glucose Bld gHb Est-mCnc: 88 mg/dL
Hgb A1c MFr Bld: 4.7 % — ABNORMAL LOW (ref 4.8–5.6)

## 2020-06-28 LAB — COMPREHENSIVE METABOLIC PANEL
ALT: 19 IU/L (ref 0–32)
AST: 25 IU/L (ref 0–40)
Albumin/Globulin Ratio: 1.8 (ref 1.2–2.2)
Albumin: 4.6 g/dL (ref 3.6–4.6)
Alkaline Phosphatase: 74 IU/L (ref 48–121)
BUN/Creatinine Ratio: 22 (ref 12–28)
BUN: 19 mg/dL (ref 8–27)
Bilirubin Total: 0.3 mg/dL (ref 0.0–1.2)
CO2: 22 mmol/L (ref 20–29)
Calcium: 8.9 mg/dL (ref 8.7–10.3)
Chloride: 87 mmol/L — ABNORMAL LOW (ref 96–106)
Creatinine, Ser: 0.88 mg/dL (ref 0.57–1.00)
GFR calc Af Amer: 68 mL/min/{1.73_m2} (ref >59)
GFR calc non Af Amer: 59 mL/min/{1.73_m2} — ABNORMAL LOW (ref >59)
Globulin, Total: 2.6 g/dL (ref 1.5–4.5)
Glucose: 117 mg/dL — ABNORMAL HIGH (ref 65–99)
Potassium: 4.4 mmol/L (ref 3.5–5.2)
Sodium: 126 mmol/L — ABNORMAL LOW (ref 134–144)
Total Protein: 7.2 g/dL (ref 6.0–8.5)

## 2020-07-17 NOTE — Addendum Note (Signed)
Addended by: Myles Lipps on: 07/17/2020 08:12 PM   Modules accepted: Orders

## 2020-07-22 ENCOUNTER — Encounter: Payer: Self-pay | Admitting: Radiology

## 2020-10-08 DIAGNOSIS — J309 Allergic rhinitis, unspecified: Secondary | ICD-10-CM | POA: Diagnosis not present

## 2020-10-08 DIAGNOSIS — I10 Essential (primary) hypertension: Secondary | ICD-10-CM | POA: Diagnosis not present

## 2020-10-08 DIAGNOSIS — Z79899 Other long term (current) drug therapy: Secondary | ICD-10-CM | POA: Diagnosis not present

## 2020-10-08 DIAGNOSIS — E559 Vitamin D deficiency, unspecified: Secondary | ICD-10-CM | POA: Diagnosis not present

## 2020-10-08 DIAGNOSIS — R42 Dizziness and giddiness: Secondary | ICD-10-CM | POA: Diagnosis not present

## 2020-10-08 DIAGNOSIS — I700 Atherosclerosis of aorta: Secondary | ICD-10-CM | POA: Diagnosis not present

## 2020-11-26 ENCOUNTER — Other Ambulatory Visit: Payer: Self-pay | Admitting: Registered Nurse

## 2020-11-26 DIAGNOSIS — E2839 Other primary ovarian failure: Secondary | ICD-10-CM

## 2021-01-29 ENCOUNTER — Other Ambulatory Visit: Payer: Self-pay

## 2021-01-29 ENCOUNTER — Encounter (HOSPITAL_COMMUNITY): Payer: Self-pay

## 2021-01-29 ENCOUNTER — Emergency Department (HOSPITAL_COMMUNITY): Payer: Medicare Other

## 2021-01-29 ENCOUNTER — Inpatient Hospital Stay (HOSPITAL_COMMUNITY)
Admission: EM | Admit: 2021-01-29 | Discharge: 2021-01-31 | DRG: 640 | Disposition: A | Discharge status: Home / Self Care | Payer: Medicare Other | Attending: Internal Medicine | Admitting: Internal Medicine

## 2021-01-29 DIAGNOSIS — E119 Type 2 diabetes mellitus without complications: Secondary | ICD-10-CM

## 2021-01-29 DIAGNOSIS — D631 Anemia in chronic kidney disease: Secondary | ICD-10-CM | POA: Diagnosis present

## 2021-01-29 DIAGNOSIS — Z7984 Long term (current) use of oral hypoglycemic drugs: Secondary | ICD-10-CM

## 2021-01-29 DIAGNOSIS — R7989 Other specified abnormal findings of blood chemistry: Secondary | ICD-10-CM | POA: Diagnosis present

## 2021-01-29 DIAGNOSIS — Z981 Arthrodesis status: Secondary | ICD-10-CM

## 2021-01-29 DIAGNOSIS — G9341 Metabolic encephalopathy: Secondary | ICD-10-CM

## 2021-01-29 DIAGNOSIS — I129 Hypertensive chronic kidney disease with stage 1 through stage 4 chronic kidney disease, or unspecified chronic kidney disease: Secondary | ICD-10-CM | POA: Diagnosis present

## 2021-01-29 DIAGNOSIS — I10 Essential (primary) hypertension: Secondary | ICD-10-CM | POA: Diagnosis not present

## 2021-01-29 DIAGNOSIS — E1122 Type 2 diabetes mellitus with diabetic chronic kidney disease: Secondary | ICD-10-CM | POA: Diagnosis present

## 2021-01-29 DIAGNOSIS — K5900 Constipation, unspecified: Secondary | ICD-10-CM | POA: Diagnosis present

## 2021-01-29 DIAGNOSIS — E876 Hypokalemia: Secondary | ICD-10-CM | POA: Diagnosis present

## 2021-01-29 DIAGNOSIS — E871 Hypo-osmolality and hyponatremia: Secondary | ICD-10-CM | POA: Diagnosis not present

## 2021-01-29 DIAGNOSIS — G44229 Chronic tension-type headache, not intractable: Secondary | ICD-10-CM | POA: Diagnosis present

## 2021-01-29 DIAGNOSIS — Z20822 Contact with and (suspected) exposure to covid-19: Secondary | ICD-10-CM | POA: Diagnosis present

## 2021-01-29 DIAGNOSIS — E1169 Type 2 diabetes mellitus with other specified complication: Secondary | ICD-10-CM

## 2021-01-29 DIAGNOSIS — N1831 Chronic kidney disease, stage 3a: Secondary | ICD-10-CM | POA: Diagnosis present

## 2021-01-29 DIAGNOSIS — Z888 Allergy status to other drugs, medicaments and biological substances status: Secondary | ICD-10-CM

## 2021-01-29 DIAGNOSIS — Z79899 Other long term (current) drug therapy: Secondary | ICD-10-CM

## 2021-01-29 LAB — URINALYSIS, ROUTINE W REFLEX MICROSCOPIC
Bilirubin Urine: NEGATIVE
Glucose, UA: NEGATIVE mg/dL
Ketones, ur: NEGATIVE mg/dL
Nitrite: NEGATIVE
Protein, ur: NEGATIVE mg/dL
Specific Gravity, Urine: 1.011 (ref 1.005–1.030)
pH: 5 (ref 5.0–8.0)

## 2021-01-29 LAB — BASIC METABOLIC PANEL
Anion gap: 10 (ref 5–15)
Anion gap: 9 (ref 5–15)
BUN: 17 mg/dL (ref 8–23)
BUN: 16 mg/dL (ref 8–23)
CO2: 26 mmol/L (ref 22–32)
CO2: 25 mmol/L (ref 22–32)
Calcium: 8.8 mg/dL — ABNORMAL LOW (ref 8.9–10.3)
Calcium: 8.3 mg/dL — ABNORMAL LOW (ref 8.9–10.3)
Chloride: 92 mmol/L — ABNORMAL LOW (ref 98–111)
Chloride: 96 mmol/L — ABNORMAL LOW (ref 98–111)
Creatinine, Ser: 0.86 mg/dL (ref 0.44–1.00)
Creatinine, Ser: 1.02 mg/dL — ABNORMAL HIGH (ref 0.44–1.00)
GFR, Estimated: >60 mL/min (ref >60)
GFR, Estimated: 53 mL/min — ABNORMAL LOW (ref >60)
Glucose, Bld: 81 mg/dL (ref 70–99)
Glucose, Bld: 92 mg/dL (ref 70–99)
Potassium: 3.4 mmol/L — ABNORMAL LOW (ref 3.5–5.1)
Potassium: 3.5 mmol/L (ref 3.5–5.1)
Sodium: 128 mmol/L — ABNORMAL LOW (ref 135–145)
Sodium: 130 mmol/L — ABNORMAL LOW (ref 135–145)

## 2021-01-29 LAB — CBC WITH DIFFERENTIAL/PLATELET
Abs Immature Granulocytes: 0.02 10*3/uL (ref 0.00–0.07)
Basophils Absolute: 0 10*3/uL (ref 0.0–0.1)
Basophils Relative: 1 %
Eosinophils Absolute: 0.2 10*3/uL (ref 0.0–0.5)
Eosinophils Relative: 3 %
HCT: 29.4 % — ABNORMAL LOW (ref 36.0–46.0)
Hemoglobin: 10.4 g/dL — ABNORMAL LOW (ref 12.0–15.0)
Immature Granulocytes: 0 %
Lymphocytes Relative: 11 %
Lymphs Abs: 0.7 10*3/uL (ref 0.7–4.0)
MCH: 30.1 pg (ref 26.0–34.0)
MCHC: 35.4 g/dL (ref 30.0–36.0)
MCV: 85 fL (ref 80.0–100.0)
Monocytes Absolute: 0.6 10*3/uL (ref 0.1–1.0)
Monocytes Relative: 8 %
Neutro Abs: 5.4 10*3/uL (ref 1.7–7.7)
Neutrophils Relative %: 77 %
Platelets: 312 10*3/uL (ref 150–400)
RBC: 3.46 MIL/uL — ABNORMAL LOW (ref 3.87–5.11)
RDW: 11.5 % (ref 11.5–15.5)
WBC: 7 10*3/uL (ref 4.0–10.5)
nRBC: 0 % (ref 0.0–0.2)

## 2021-01-29 LAB — COMPREHENSIVE METABOLIC PANEL
ALT: 19 U/L (ref 0–44)
AST: 26 U/L (ref 15–41)
Albumin: 4.3 g/dL (ref 3.5–5.0)
Alkaline Phosphatase: 67 U/L (ref 38–126)
Anion gap: 13 (ref 5–15)
BUN: 18 mg/dL (ref 8–23)
CO2: 25 mmol/L (ref 22–32)
Calcium: 9.1 mg/dL (ref 8.9–10.3)
Chloride: 82 mmol/L — ABNORMAL LOW (ref 98–111)
Creatinine, Ser: 1.15 mg/dL — ABNORMAL HIGH (ref 0.44–1.00)
GFR, Estimated: 46 mL/min — ABNORMAL LOW (ref >60)
Glucose, Bld: 172 mg/dL — ABNORMAL HIGH (ref 70–99)
Potassium: 3.2 mmol/L — ABNORMAL LOW (ref 3.5–5.1)
Sodium: 120 mmol/L — ABNORMAL LOW (ref 135–145)
Total Bilirubin: 0.6 mg/dL (ref 0.3–1.2)
Total Protein: 7.4 g/dL (ref 6.5–8.1)

## 2021-01-29 LAB — RESP PANEL BY RT-PCR (FLU A&B, COVID) ARPGX2
Influenza A by PCR: NEGATIVE
Influenza B by PCR: NEGATIVE
SARS Coronavirus 2 by RT PCR: NEGATIVE

## 2021-01-29 LAB — GLUCOSE, CAPILLARY: Glucose-Capillary: 98 mg/dL (ref 70–99)

## 2021-01-29 LAB — CBG MONITORING, ED: Glucose-Capillary: 125 mg/dL — ABNORMAL HIGH (ref 70–99)

## 2021-01-29 LAB — SODIUM, URINE, RANDOM: Sodium, Ur: <10 mmol/L

## 2021-01-29 LAB — TSH: TSH: 0.462 u[IU]/mL (ref 0.350–4.500)

## 2021-01-29 MED ORDER — POLYETHYLENE GLYCOL 3350 17 G PO PACK: 17.0000 g | PACK | Freq: Every day | ORAL | Status: DC | PRN

## 2021-01-29 MED ORDER — SODIUM CHLORIDE 0.9 % IV SOLN: INTRAVENOUS | Status: DC

## 2021-01-29 MED ORDER — POTASSIUM CHLORIDE CRYS ER 20 MEQ PO TBCR
20.0000 meq | EXTENDED_RELEASE_TABLET | Freq: Once | ORAL | Status: AC
  Administered 2021-01-29: 20 meq via ORAL
  Filled 2021-01-29: qty 1

## 2021-01-29 MED ORDER — INSULIN ASPART 100 UNIT/ML ~~LOC~~ SOLN
0.0000 [IU] | Freq: Three times a day (TID) | SUBCUTANEOUS | Status: DC
  Administered 2021-01-29 – 2021-01-30 (×2): 1 [IU] via SUBCUTANEOUS
  Administered 2021-01-30: 2 [IU] via SUBCUTANEOUS
  Filled 2021-01-29: qty 0.09

## 2021-01-29 MED ORDER — ACETAMINOPHEN 325 MG PO TABS
650.0000 mg | ORAL_TABLET | Freq: Four times a day (QID) | ORAL | Status: DC | PRN
  Administered 2021-01-30: 650 mg via ORAL
  Filled 2021-01-29: qty 2

## 2021-01-29 MED ORDER — AMLODIPINE BESYLATE 10 MG PO TABS
10.0000 mg | ORAL_TABLET | Freq: Every day | ORAL | Status: DC
  Administered 2021-01-30 – 2021-01-31 (×2): 10 mg via ORAL
  Filled 2021-01-29 (×2): qty 1

## 2021-01-29 MED ORDER — HYDROXYZINE HCL 10 MG PO TABS
10.0000 mg | ORAL_TABLET | Freq: Once | ORAL | Status: AC
  Administered 2021-01-29: 10 mg via ORAL
  Filled 2021-01-29: qty 1

## 2021-01-29 MED ORDER — METOPROLOL TARTRATE 5 MG/5ML IV SOLN: 5.0000 mg | Freq: Four times a day (QID) | INTRAVENOUS | Status: DC | PRN

## 2021-01-29 MED ORDER — ALBUTEROL SULFATE HFA 108 (90 BASE) MCG/ACT IN AERS: 2.0000 | INHALATION_SPRAY | Freq: Four times a day (QID) | RESPIRATORY_TRACT | Status: DC | PRN

## 2021-01-29 MED ORDER — ENOXAPARIN SODIUM 30 MG/0.3ML ~~LOC~~ SOLN
30.0000 mg | SUBCUTANEOUS | Status: DC
  Administered 2021-01-29 – 2021-01-30 (×2): 30 mg via SUBCUTANEOUS
  Filled 2021-01-29 (×2): qty 0.3

## 2021-01-29 MED ORDER — MONTELUKAST SODIUM 10 MG PO TABS
10.0000 mg | ORAL_TABLET | Freq: Every day | ORAL | Status: DC
  Administered 2021-01-29 – 2021-01-30 (×2): 10 mg via ORAL
  Filled 2021-01-29 (×2): qty 1

## 2021-01-29 MED ORDER — ACETAMINOPHEN 650 MG RE SUPP: 650.0000 mg | Freq: Four times a day (QID) | RECTAL | Status: DC | PRN

## 2021-01-29 MED ORDER — SODIUM CHLORIDE 0.9 % IV BOLUS
1000.0000 mL | Freq: Once | INTRAVENOUS | Status: AC
  Administered 2021-01-29: 1000 mL via INTRAVENOUS

## 2021-01-29 NOTE — ED Notes (Signed)
Patient transported to CT 

## 2021-01-29 NOTE — ED Provider Notes (Signed)
[facility] DEPT Provider Note   CSN: [id number] Arrival date & time: 01/29/21  0350     History Chief Complaint  Patient presents with  . Abnormal Lab    Robin Andrews is a 87 y.o. female.  According to the son the patient has been mildly confused for last week and it seems to be getting worse.  She saw her doctor yesterday and he did some lab work and then told them to come to the emergency department because her salt level was low.  The history is provided by the patient and a relative. No language interpreter was used.  Altered Mental Status Presenting symptoms: behavior changes   Severity:  Mild Most recent episode:  More than 2 days ago Episode history:  Continuous Timing:  Constant Progression:  Waxing and waning Chronicity:  New Context: not alcohol use   Associated symptoms: no abdominal pain        Past Medical History:  Diagnosis Date  . Degenerative disc disease   . Diabetes mellitus without complication ([place])   . Hypertension   . Pruritus     Patient Active Problem List   Diagnosis Date Noted  . Vertigo 04/18/2020  . Noninfectious otitis externa of right ear 04/18/2020  . Chronic neck pain 11/09/2019  . Chronic bilateral low back pain without sciatica 11/09/2019  . Diabetes mellitus type 2, controlled, without complications ([place]) 09/38/1829  . Cardiac murmur 12/18/2017  . Cervical disc disease 05/25/2012  . Chronic pain syndrome 05/25/2012  . Osteoarthritis 05/25/2012  . Hyperlipidemia 05/30/2007  . HYPERTENSION, BENIGN ESSENTIAL 05/30/2007  . ASTEATOTIC ECZEMA 05/30/2007    Past Surgical History:  Procedure Laterality Date  . ANTERIOR CERVICAL DECOMP/DISCECTOMY FUSION  02/2010   C3-6/notes 03/09/2010  . BACK SURGERY    . DILATION AND CURETTAGE, DIAGNOSTIC / THERAPEUTIC       OB History   No obstetric history on file.     Family History  Family history unknown: Yes    Social History   Tobacco Use  .  Smoking status: Never Smoker  . Smokeless tobacco: Never Used  Vaping Use  . Vaping Use: Never used  Substance Use Topics  . Alcohol use: No  . Drug use: No    Home Medications Prior to Admission medications   Medication Sig Start Date End Date Taking? Authorizing Provider  acetic acid-hydrocortisone (VOSOL-HC) OTIC solution Place 3 drops into the left ear 2 (two) times daily. 04/18/20   Wendall Mola, NP  albuterol (PROVENTIL HFA;VENTOLIN HFA) 108 (90 Base) MCG/ACT inhaler Inhale 2 puffs into the lungs every 6 (six) hours as needed for wheezing or shortness of breath. 01/12/19   Jacelyn Pi, Lilia Argue, MD  amLODipine (NORVASC) 10 MG tablet TAKE 1 TABLET(10 MG) BY MOUTH DAILY 06/27/20   Jacelyn Pi, Lilia Argue, MD  Blood Pressure Monitoring (BLOOD PRESSURE KIT) DEVI 1 Device by Does not apply route daily. 10/17/18   Tenna Delaine D, PA-C  FLUZONE HIGH-DOSE QUADRIVALENT 0.7 ML SUSY  09/26/19   [provider]  glimepiride (AMARYL) 1 MG tablet TAKE 1/2 TABLET(0.5 MG) BY MOUTH TWICE DAILY 06/27/20   Jacelyn Pi, Lilia Argue, MD  hydrOXYzine (ATARAX/VISTARIL) 10 MG tablet Take 1 tablet (10 mg total) by mouth 3 (three) times daily as needed for itching. 02/01/20   Jacelyn Pi, Lilia Argue, MD  meclizine (ANTIVERT) 25 MG tablet Take 1 tablet (25 mg total) by mouth 3 (three) times daily as needed for dizziness. 04/18/20  Wendall Mola, NP  olmesartan (BENICAR) 20 MG tablet TAKE 1 TABLET(20 MG) BY MOUTH DAILY 06/27/20   Jacelyn Pi, Lilia Argue, MD  White Petrolatum-Mineral Oil (SYSTANE NIGHTTIME) OINT SMARTSIG:1 Inch(es) In Eye(s) Every Night 01/10/20   [provider]    Allergies    Hydrochlorothiazide and Lisinopril  Review of Systems   Review of Systems  Unable to perform ROS: Mental status change  Gastrointestinal: Negative for abdominal pain.    Physical Exam Updated Vital Signs BP 131/60   Pulse 88   Temp 99.1 F (37.3 C) (Oral)   Resp (!) 23   SpO2 100%    Physical Exam Vitals and nursing note reviewed.  Constitutional:      Appearance: She is well-developed.  HENT:     Head: Normocephalic.     Nose: Nose normal.  Eyes:     General: No scleral icterus.    Extraocular Movements: EOM normal.     Conjunctiva/sclera: Conjunctivae normal.  Neck:     Thyroid: No thyromegaly.  Cardiovascular:     Rate and Rhythm: Normal rate and regular rhythm.     Heart sounds: No murmur heard. No friction rub. No gallop.   Pulmonary:     Breath sounds: No stridor. No wheezing or rales.  Chest:     Chest wall: No tenderness.  Abdominal:     General: There is no distension.     Tenderness: There is no abdominal tenderness. There is no rebound.  Musculoskeletal:        General: No edema. Normal range of motion.     Cervical back: Neck supple.  Lymphadenopathy:     Cervical: No cervical adenopathy.  Skin:    Findings: No erythema or rash.  Neurological:     Mental Status: She is alert.     Motor: No abnormal muscle tone.     Coordination: Coordination normal.     Comments: Mild confusion but oriented to person and place  Psychiatric:        Mood and Affect: Mood and affect normal.     ED Results / Procedures / Treatments   Labs (all labs ordered are listed, but only abnormal results are displayed) Labs Reviewed  CBC WITH DIFFERENTIAL/PLATELET - Abnormal; Notable for the following components:      Result Value   RBC 3.46 (*)    Hemoglobin 10.4 (*)    HCT 29.4 (*)    All other components within normal limits  COMPREHENSIVE METABOLIC PANEL - Abnormal; Notable for the following components:   Sodium 120 (*)    Potassium 3.2 (*)    Chloride 82 (*)    Glucose, Bld 172 (*)    Creatinine, Ser 1.15 (*)    GFR, Estimated 46 (*)    All other components within normal limits  URINALYSIS, ROUTINE W REFLEX MICROSCOPIC - Abnormal; Notable for the following components:   Hgb urine dipstick SMALL (*)    Leukocytes,Ua TRACE (*)    Bacteria, UA RARE  (*)    All other components within normal limits  RESP PANEL BY RT-PCR (FLU A&B, COVID) ARPGX2    EKG None  Radiology CT Head Wo Contrast  Result Date: 01/29/2021 CLINICAL DATA:  Cerebral hemorrhage suspected, abnormal labs EXAM: CT HEAD WITHOUT CONTRAST TECHNIQUE: Contiguous axial images were obtained from the base of the skull through the vertex without intravenous contrast. COMPARISON:  2018 FINDINGS: Brain: There is no acute intracranial hemorrhage, mass effect, or edema. Gray-white differentiation is preserved.  There is no extra-axial fluid collection. Patchy and confluent areas of hypoattenuation in the supratentorial white matter nonspecific but probably reflect mild to moderate chronic microvascular ischemic changes. Prominence of the ventricles and sulci reflects mild generalized parenchymal volume loss. Vascular: There is atherosclerotic calcification at the skull base. Skull: Calvarium is unremarkable. Sinuses/Orbits: No acute finding. Other: None. IMPRESSION: No acute intracranial hemorrhage, mass effect, or evidence of acute infarction. Chronic microvascular ischemic changes. Electronically Signed   By: Macy Mis M.D.   On: 01/29/2021 14:50    Procedures Procedures   Medications Ordered in ED Medications  sodium chloride 0.9 % bolus 1,000 mL (0 mLs Intravenous Stopped 01/29/21 1341)    ED Course  I have reviewed the triage vital signs and the nursing notes.  Pertinent labs & imaging results that were available during my care of the patient were reviewed by me and considered in my medical decision making (see chart for details).    MDM Rules/Calculators/A&P                         CT scan negative.  Patient with hyponatremia.  She is started on saline and will be admitted to medicine Final Clinical Impression(s) / ED Diagnoses Final diagnoses:  Hyponatremia    Rx / DC Orders ED Discharge Orders    None       Milton Ferguson, MD 01/29/21 1514

## 2021-01-29 NOTE — ED Notes (Addendum)
Call for Report at 1840 to [facility] [phone]

## 2021-01-29 NOTE — ED Triage Notes (Signed)
Pt presents with c/o abnormal lab. Pt went to her PCP yesterday and had blood drawn and received a call this morning that she needed to come to the ER. Grandson at bedside unsure of what the lab was but reported that he was told she needed blood.

## 2021-01-29 NOTE — H&P (Signed)
History and Physical        Hospital Admission Note Date: 01/29/2021  Patient name: Robin Andrews record number: [id number] Date of birth: [birth year] Age: 87 y.o. Gender: female  PCP: System, Provider Not In  Patient coming from: home Lives with: alone At baseline, ambulates: independently  Chief Complaint    Chief Complaint  Patient presents with  . Abnormal Lab      HPI:   Patient's grandson at bedside assisted with translation  This is an 87 year old [place] speaking female with past medical history of hypertension, diabetes who was seen by her PCP yesterday for 1 week of confusion and was found to have abnormal lab work prompting her to be referred to the ED today. Per grandson, the patient has had a headache for the past week or so and he also noted that she seemed more confused, noted that she was asking the same questions multiple times and not oriented to place. He and the family felt that this was just old age and did not think much of it. The patient went for a routine appointment at her PCP yesterday and this was brought up and so routine lab work was done. She denies any alcohol use, change in appetite or new medications.    ED Course: Afebrile, hemodynamically stable, on room air. Notable Labs: Na 120, K 3.2, Cl 82, Glucose 172, Cr 1.15 (previously 0.88 on 06/27/20), WBC 7.0, Hb 10.4, platelets 312. Notable Imaging: CT head-unremarkable for acute pathology. Patient received 1L NS bolus.    Vitals:   01/29/21 1445 01/29/21 1500  BP:  131/60  Pulse: 90 88  Resp:  (!) 23  Temp:    SpO2: 100% 100%     Review of Systems:  Review of Systems  Eyes: Negative for blurred vision.  All other systems reviewed and are negative.   Medical/Social/Family History   Past Medical History: Past Medical History:  Diagnosis Date  . Degenerative disc disease   .  Diabetes mellitus without complication ([place])   . Hypertension   . Pruritus     Past Surgical History:  Procedure Laterality Date  . ANTERIOR CERVICAL DECOMP/DISCECTOMY FUSION  02/2010   C3-6/notes 03/09/2010  . BACK SURGERY    . DILATION AND CURETTAGE, DIAGNOSTIC / THERAPEUTIC      Medications: Prior to Admission medications   Medication Sig Start Date End Date Taking? Authorizing Provider  acetic acid-hydrocortisone (VOSOL-HC) OTIC solution Place 3 drops into the left ear 2 (two) times daily. 04/18/20   Wendall Mola, NP  albuterol (PROVENTIL HFA;VENTOLIN HFA) 108 (90 Base) MCG/ACT inhaler Inhale 2 puffs into the lungs every 6 (six) hours as needed for wheezing or shortness of breath. 01/12/19   Jacelyn Pi, Lilia Argue, MD  amLODipine (NORVASC) 10 MG tablet TAKE 1 TABLET(10 MG) BY MOUTH DAILY 06/27/20   Jacelyn Pi, Lilia Argue, MD  Blood Pressure Monitoring (BLOOD PRESSURE KIT) DEVI 1 Device by Does not apply route daily. 10/17/18   Tenna Delaine D, PA-C  FLUZONE HIGH-DOSE QUADRIVALENT 0.7 ML SUSY  09/26/19   [provider]  glimepiride (AMARYL) 1 MG tablet TAKE 1/2 TABLET(0.5 MG) BY MOUTH TWICE DAILY 06/27/20   Jacelyn Pi, [place]  M, MD  hydrOXYzine (ATARAX/VISTARIL) 10 MG tablet Take 1 tablet (10 mg total) by mouth 3 (three) times daily as needed for itching. 02/01/20   Jacelyn Pi, Lilia Argue, MD  meclizine (ANTIVERT) 25 MG tablet Take 1 tablet (25 mg total) by mouth 3 (three) times daily as needed for dizziness. 04/18/20   Wendall Mola, NP  olmesartan (BENICAR) 20 MG tablet TAKE 1 TABLET(20 MG) BY MOUTH DAILY 06/27/20   Jacelyn Pi, Lilia Argue, MD  White Petrolatum-Mineral Oil (SYSTANE NIGHTTIME) OINT SMARTSIG:1 Inch(es) In Eye(s) Every Night 01/10/20   [provider]    Allergies:   Allergies  Allergen Reactions  . Hydrochlorothiazide     Severe hyponatremia resulting in hospitalization  . Lisinopril     Social History:  reports that she has never smoked.  She has never used smokeless tobacco. She reports that she does not drink alcohol and does not use drugs.  Family History: Family History  Family history unknown: Yes     Objective   Physical Exam: Blood pressure 131/60, pulse 88, temperature 99.1 F (37.3 C), temperature source Oral, resp. rate (!) 23, SpO2 100 %.  Physical Exam Vitals and nursing note reviewed.  Constitutional:      Appearance: Normal appearance.  HENT:     Head: Normocephalic and atraumatic.  Eyes:     Conjunctiva/sclera: Conjunctivae normal.  Cardiovascular:     Rate and Rhythm: Normal rate and regular rhythm.  Pulmonary:     Effort: Pulmonary effort is normal.     Breath sounds: Normal breath sounds.  Abdominal:     General: Abdomen is flat.     Palpations: Abdomen is soft.  Musculoskeletal:        General: No swelling or tenderness.  Skin:    Coloration: Skin is not jaundiced or pale.  Neurological:     Mental Status: She is alert and oriented to person, place, and time.  Psychiatric:        Mood and Affect: Mood normal.        Behavior: Behavior normal.     LABS on Admission: I have personally reviewed all the labs and imaging below    Basic Metabolic Panel: Recent Labs  Lab 01/29/21 1213  NA 120*  K 3.2*  CL 82*  CO2 25  GLUCOSE 172*  BUN 18  CREATININE 1.15*  CALCIUM 9.1   Liver Function Tests: Recent Labs  Lab 01/29/21 1213  AST 26  ALT 19  ALKPHOS 67  BILITOT 0.6  PROT 7.4  ALBUMIN 4.3   No results for input(s): LIPASE, AMYLASE in the last 168 hours. No results for input(s): AMMONIA in the last 168 hours. CBC: Recent Labs  Lab 01/29/21 1213  WBC 7.0  NEUTROABS 5.4  HGB 10.4*  HCT 29.4*  MCV 85.0  PLT 312   Cardiac Enzymes: No results for input(s): CKTOTAL, CKMB, CKMBINDEX, TROPONINI in the last 168 hours. BNP: Invalid input(s): POCBNP CBG: No results for input(s): GLUCAP in the last 168 hours.  Radiological Exams on Admission:  CT Head Wo  Contrast  Result Date: 01/29/2021 CLINICAL DATA:  Cerebral hemorrhage suspected, abnormal labs EXAM: CT HEAD WITHOUT CONTRAST TECHNIQUE: Contiguous axial images were obtained from the base of the skull through the vertex without intravenous contrast. COMPARISON:  2018 FINDINGS: Brain: There is no acute intracranial hemorrhage, mass effect, or edema. Gray-white differentiation is preserved. There is no extra-axial fluid collection. Patchy and confluent areas of hypoattenuation in the supratentorial white matter nonspecific  but probably reflect mild to moderate chronic microvascular ischemic changes. Prominence of the ventricles and sulci reflects mild generalized parenchymal volume loss. Vascular: There is atherosclerotic calcification at the skull base. Skull: Calvarium is unremarkable. Sinuses/Orbits: No acute finding. Other: None. IMPRESSION: No acute intracranial hemorrhage, mass effect, or evidence of acute infarction. Chronic microvascular ischemic changes. Electronically Signed   By: Macy Mis M.D.   On: 01/29/2021 14:50      EKG: Not done   A & P   Principal Problem:   Hyponatremia Active Problems:   HYPERTENSION, BENIGN ESSENTIAL   Diabetes mellitus type 2, controlled, without complications ([facility])   Acute metabolic encephalopathy   1. Acute metabolic encephalopathy suspect secondary to Hyponatremia of unknown etiology a. Hold SSRI b. Check Sodium studies c. Check TSH d. Continue with IV fluids e. Trend Na Q4h and avoid rapid correction f. Hold home Hydroxyzine in elderly patient as well  2. Hypertension a. Continue home amlodipine b. Hold home losartan c. PRN lopressor  3. Diabetes a. Holding home glimepiride b. Sliding scale insulin  4. Hypokalemia a. Replete PO  5. Elevated Creatinine from baseline a. Cr 1.15, baseline 0.88 in 2021 b. Follow up after IV fluids  6. Headache, likely tension  a. PRN Tylenol     DVT prophylaxis: lovenox   Code Status:  Prior  Diet: regular Family Communication: Admission, patients condition and plan of care including tests being ordered have been discussed with the patient who indicates understanding and agrees with the plan and Code Status. Patient's grandson was updated  Disposition Plan: The appropriate patient status for this patient is OBSERVATION. Observation status is judged to be reasonable and necessary in order to provide the required intensity of service to ensure the patient's safety. The patient's presenting symptoms, physical exam findings, and initial radiographic and laboratory data in the context of their medical condition is felt to place them at decreased risk for further clinical deterioration. Furthermore, it is anticipated that the patient will be medically stable for discharge from the hospital within 2 midnights of admission. The following factors support the patient status of observation.   " The patient's presenting symptoms include confusion. " The physical exam findings include unremarkable. " The initial radiographic and laboratory data are hyponatremia.      Consultants  . none  Procedures  . none  Time Spent on Admission: 55 minutes    Harold Hedge, DO [facility]  01/29/2021, 4:17 PM

## 2021-01-30 DIAGNOSIS — K5900 Constipation, unspecified: Secondary | ICD-10-CM | POA: Diagnosis present

## 2021-01-30 DIAGNOSIS — E876 Hypokalemia: Secondary | ICD-10-CM | POA: Diagnosis present

## 2021-01-30 DIAGNOSIS — G9341 Metabolic encephalopathy: Secondary | ICD-10-CM | POA: Diagnosis present

## 2021-01-30 DIAGNOSIS — E871 Hypo-osmolality and hyponatremia: Principal | ICD-10-CM

## 2021-01-30 DIAGNOSIS — Z981 Arthrodesis status: Secondary | ICD-10-CM | POA: Diagnosis not present

## 2021-01-30 DIAGNOSIS — Z7984 Long term (current) use of oral hypoglycemic drugs: Secondary | ICD-10-CM | POA: Diagnosis not present

## 2021-01-30 DIAGNOSIS — R7989 Other specified abnormal findings of blood chemistry: Secondary | ICD-10-CM | POA: Diagnosis present

## 2021-01-30 DIAGNOSIS — E1122 Type 2 diabetes mellitus with diabetic chronic kidney disease: Secondary | ICD-10-CM | POA: Diagnosis present

## 2021-01-30 DIAGNOSIS — I129 Hypertensive chronic kidney disease with stage 1 through stage 4 chronic kidney disease, or unspecified chronic kidney disease: Secondary | ICD-10-CM | POA: Diagnosis present

## 2021-01-30 DIAGNOSIS — G44229 Chronic tension-type headache, not intractable: Secondary | ICD-10-CM | POA: Diagnosis present

## 2021-01-30 DIAGNOSIS — N1831 Chronic kidney disease, stage 3a: Secondary | ICD-10-CM | POA: Diagnosis present

## 2021-01-30 DIAGNOSIS — D631 Anemia in chronic kidney disease: Secondary | ICD-10-CM | POA: Diagnosis present

## 2021-01-30 DIAGNOSIS — Z888 Allergy status to other drugs, medicaments and biological substances status: Secondary | ICD-10-CM | POA: Diagnosis not present

## 2021-01-30 DIAGNOSIS — Z79899 Other long term (current) drug therapy: Secondary | ICD-10-CM | POA: Diagnosis not present

## 2021-01-30 DIAGNOSIS — Z20822 Contact with and (suspected) exposure to covid-19: Secondary | ICD-10-CM | POA: Diagnosis present

## 2021-01-30 LAB — BASIC METABOLIC PANEL
Anion gap: 10 (ref 5–15)
Anion gap: 11 (ref 5–15)
Anion gap: 11 (ref 5–15)
Anion gap: 12 (ref 5–15)
BUN: 14 mg/dL (ref 8–23)
BUN: 11 mg/dL (ref 8–23)
BUN: 18 mg/dL (ref 8–23)
BUN: 19 mg/dL (ref 8–23)
CO2: 24 mmol/L (ref 22–32)
CO2: 25 mmol/L (ref 22–32)
CO2: 23 mmol/L (ref 22–32)
CO2: 24 mmol/L (ref 22–32)
Calcium: 8.4 mg/dL — ABNORMAL LOW (ref 8.9–10.3)
Calcium: 8.8 mg/dL — ABNORMAL LOW (ref 8.9–10.3)
Calcium: 9.1 mg/dL (ref 8.9–10.3)
Calcium: 9.1 mg/dL (ref 8.9–10.3)
Chloride: 97 mmol/L — ABNORMAL LOW (ref 98–111)
Chloride: 94 mmol/L — ABNORMAL LOW (ref 98–111)
Chloride: 98 mmol/L (ref 98–111)
Chloride: 99 mmol/L (ref 98–111)
Creatinine, Ser: 0.87 mg/dL (ref 0.44–1.00)
Creatinine, Ser: 0.81 mg/dL (ref 0.44–1.00)
Creatinine, Ser: 0.86 mg/dL (ref 0.44–1.00)
Creatinine, Ser: 0.9 mg/dL (ref 0.44–1.00)
GFR, Estimated: >60 mL/min (ref >60)
GFR, Estimated: >60 mL/min (ref >60)
GFR, Estimated: >60 mL/min (ref >60)
GFR, Estimated: >60 mL/min (ref >60)
Glucose, Bld: 78 mg/dL (ref 70–99)
Glucose, Bld: 89 mg/dL (ref 70–99)
Glucose, Bld: 129 mg/dL — ABNORMAL HIGH (ref 70–99)
Glucose, Bld: 144 mg/dL — ABNORMAL HIGH (ref 70–99)
Potassium: 3.4 mmol/L — ABNORMAL LOW (ref 3.5–5.1)
Potassium: 3.8 mmol/L (ref 3.5–5.1)
Potassium: 3.9 mmol/L (ref 3.5–5.1)
Potassium: 3.9 mmol/L (ref 3.5–5.1)
Sodium: 131 mmol/L — ABNORMAL LOW (ref 135–145)
Sodium: 130 mmol/L — ABNORMAL LOW (ref 135–145)
Sodium: 133 mmol/L — ABNORMAL LOW (ref 135–145)
Sodium: 134 mmol/L — ABNORMAL LOW (ref 135–145)

## 2021-01-30 LAB — GLUCOSE, CAPILLARY
Glucose-Capillary: 77 mg/dL (ref 70–99)
Glucose-Capillary: 161 mg/dL — ABNORMAL HIGH (ref 70–99)
Glucose-Capillary: 130 mg/dL — ABNORMAL HIGH (ref 70–99)
Glucose-Capillary: 114 mg/dL — ABNORMAL HIGH (ref 70–99)

## 2021-01-30 LAB — OSMOLALITY: Osmolality: 258 mOsm/kg — ABNORMAL LOW (ref 275–295)

## 2021-01-30 LAB — CBC
HCT: 26.2 % — ABNORMAL LOW (ref 36.0–46.0)
Hemoglobin: 9 g/dL — ABNORMAL LOW (ref 12.0–15.0)
MCH: 30.4 pg (ref 26.0–34.0)
MCHC: 34.4 g/dL (ref 30.0–36.0)
MCV: 88.5 fL (ref 80.0–100.0)
Platelets: 265 10*3/uL (ref 150–400)
RBC: 2.96 MIL/uL — ABNORMAL LOW (ref 3.87–5.11)
RDW: 11.7 % (ref 11.5–15.5)
WBC: 6 10*3/uL (ref 4.0–10.5)
nRBC: 0 % (ref 0.0–0.2)

## 2021-01-30 LAB — OSMOLALITY, URINE: Osmolality, Ur: 296 mOsm/kg — ABNORMAL LOW (ref 300–900)

## 2021-01-30 MED ORDER — POTASSIUM CHLORIDE CRYS ER 20 MEQ PO TBCR
40.0000 meq | EXTENDED_RELEASE_TABLET | Freq: Once | ORAL | Status: AC
  Administered 2021-01-30: 40 meq via ORAL
  Filled 2021-01-30: qty 2

## 2021-01-30 NOTE — Progress Notes (Signed)
PROGRESS NOTE    Robin Andrews  GDJ:[id number] DOB: 04/15/1931 DOA: 01/29/2021 PCP: System, Provider Not In     Brief Narrative:  Robin Andrews is an 87 year old Vietnamese-speaking female with past medical history of hypertension, diabetes who was seen by her PCP yesterday for 1 week of confusion and was found to have abnormal lab work prompting her to be referred to the ED today. Per grandson, the patient has had a headache for the past week or so and he also noted that she seemed more confused, noted that she was asking the same questions multiple times and not oriented to place. He and the family felt that this was just old age and did not think much of it. The patient went for a routine appointment at her PCP yesterday and this was brought up and so routine lab work was done. She denies any alcohol use, change in appetite or new medications.   She was found to be hyponatremic and was sent to the emergency department for further evaluation and treatment.  New events last 24 hours / Subjective: Patient seen with iPad interpreter at bedside.  She states that she is in the hospital because she had surgery in 2001 and since then has had a headache.  She also states that she is anemic.  She is unaware that she was sent here actually for hyponatremia.  She denies any nausea, vomiting or diarrhea.  Complains of constipation.  She states that she lives at home alone, her grandson lives close by and checks in on her.  Asking when she can go home.  Assessment & Plan:   Principal Problem:   Hyponatremia Active Problems:   HYPERTENSION, BENIGN ESSENTIAL   Diabetes mellitus type 2, controlled, without complications (HCC)   Acute metabolic encephalopathy   Acute metabolic encephalopathy -Suspected secondary to hyponatremia -Hold mentation altering drugs including hydroxyzine -Continues to have some episodes of confusion today  Hypoosmolar hyponatremia -Unclear etiology.  Does not appear to be  hypovolemic.  Per grandson, patient was prescribed Zoloft but has not started it yet.  Hold ARB.  TSH within normal limit.  Labs not consistent with SIADH.  Per grandson, patient drinks a lot of water at home, mostly eats bread -Improved with IV fluid -Continue to trend BMP every 6 hours  Diabetes -Hold glimepiride -Sliding-scale insulin  Hypertension -Continue Norvasc.  ARB on hold  Hypokalemia -Replace, trend  CKD stage IIIa -Stable  Chronic tension headache -Supportive care   DVT prophylaxis:  enoxaparin (LOVENOX) injection 30 mg Start: 01/29/21 2200  Code Status: Full code Family Communication: No family at bedside, discussed with grandson over the phone Disposition Plan:  Status is: Observation  The patient will require care spanning > 2 midnights and should be moved to inpatient because: Altered mental status  Dispo: The patient is from: Home              Anticipated d/c is to: Home              Anticipated d/c date is: 1 day              Patient currently is not medically stable to d/c.  Continues to have altered mental status.  Monitor BMP   Difficult to place patient No     Antimicrobials:  Anti-infectives (From admission, onward)   None        Objective: Vitals:   01/29/21 1945 01/29/21 2340 01/30/21 0330 01/30/21 0731  BP:  (!) 117/52 Marland Kitchen)  136/49 (!) 140/56  Pulse:  78 69 70  Resp:  16 17 16   Temp:  99.1 F (37.3 C) 99.1 F (37.3 C) 98.9 F (37.2 C)  TempSrc:  Oral Oral Oral  SpO2:  99% 99% 100%  Weight: 45.6 kg     Height: 5' (1.524 m)       Intake/Output Summary (Last 24 hours) at 01/30/2021 1200 Last data filed at 01/30/2021 1014 Gross per 24 hour  Intake 2501.5 ml  Output 1850 ml  Net 651.5 ml   Filed Weights   01/29/21 1945  Weight: 45.6 kg    Examination:  General exam: Appears calm and comfortable  Respiratory system: Clear to auscultation. Respiratory effort normal. No respiratory distress. No conversational dyspnea.   Cardiovascular system: S1 & S2 heard, RRR. No murmurs. No pedal edema. Gastrointestinal system: Abdomen is nondistended, soft and nontender. Normal bowel sounds heard. Central nervous system: Alert and oriented. No focal neurological deficits. Speech clear.  Extremities: Symmetric in appearance  Skin: No rashes, lesions or ulcers on exposed skin  Psychiatry: Judgement and insight appear poor, great effort to maintain conversation on track  Data Reviewed: I have personally reviewed following labs and imaging studies  CBC: Recent Labs  Lab 01/29/21 1213 01/30/21 0328  WBC 7.0 6.0  NEUTROABS 5.4  --   HGB 10.4* 9.0*  HCT 29.4* 26.2*  MCV 85.0 88.5  PLT 312 265   Basic Metabolic Panel: Recent Labs  Lab 01/29/21 1213 01/29/21 1855 01/29/21 2246 01/30/21 0328 01/30/21 0746  NA 120* 128* 130* 131* 130*  K 3.2* 3.4* 3.5 3.4* 3.8  CL 82* 92* 96* 97* 94*  CO2 25 26 25 24 25   GLUCOSE 172* 81 92 78 89  BUN 18 17 16 14 11   CREATININE 1.15* 0.86 1.02* 0.87 0.81  CALCIUM 9.1 8.8* 8.3* 8.4* 8.8*   GFR: Estimated Creatinine Clearance: 34.5 mL/min (by C-G formula based on SCr of 0.81 mg/dL). Liver Function Tests: Recent Labs  Lab 01/29/21 1213  AST 26  ALT 19  ALKPHOS 67  BILITOT 0.6  PROT 7.4  ALBUMIN 4.3   No results for input(s): LIPASE, AMYLASE in the last 168 hours. No results for input(s): AMMONIA in the last 168 hours. Coagulation Profile: No results for input(s): INR, PROTIME in the last 168 hours. Cardiac Enzymes: No results for input(s): CKTOTAL, CKMB, CKMBINDEX, TROPONINI in the last 168 hours. BNP (last 3 results) No results for input(s): PROBNP in the last 8760 hours. HbA1C: No results for input(s): HGBA1C in the last 72 hours. CBG: Recent Labs  Lab 01/29/21 1637 01/29/21 2148 01/30/21 0727 01/30/21 1153  GLUCAP 125* 98 77 161*   Lipid Profile: No results for input(s): CHOL, HDL, LDLCALC, TRIG, CHOLHDL, LDLDIRECT in the last 72 hours. Thyroid  Function Tests: Recent Labs    01/29/21 1630  TSH 0.462   Anemia Panel: No results for input(s): VITAMINB12, FOLATE, FERRITIN, TIBC, IRON, RETICCTPCT in the last 72 hours. Sepsis Labs: No results for input(s): PROCALCITON, LATICACIDVEN in the last 168 hours.  Recent Results (from the past 240 hour(s))  Resp Panel by RT-PCR (Flu A&B, Covid) Nasopharyngeal Swab     Status: None   Collection Time: 01/29/21  4:27 PM   Specimen: Nasopharyngeal Swab; Nasopharyngeal(NP) swabs in vial transport medium  Result Value Ref Range Status   SARS Coronavirus 2 by RT PCR NEGATIVE NEGATIVE Final    Comment: (NOTE) SARS-CoV-2 target nucleic acids are NOT DETECTED.  The SARS-CoV-2 RNA is generally  detectable in upper respiratory specimens during the acute phase of infection. The lowest concentration of SARS-CoV-2 viral copies this assay can detect is 138 copies/mL. A negative result does not preclude SARS-Cov-2 infection and should not be used as the sole basis for treatment or other patient management decisions. A negative result may occur with  improper specimen collection/handling, submission of specimen other than nasopharyngeal swab, presence of viral mutation(s) within the areas targeted by this assay, and inadequate number of viral copies(<138 copies/mL). A negative result must be combined with clinical observations, patient history, and epidemiological information. The expected result is Negative.  Fact Sheet for Patients:  BloggerCourse.com  Fact Sheet for Healthcare Providers:  SeriousBroker.it  This test is no t yet approved or cleared by the [place] FDA and  has been authorized for detection and/or diagnosis of SARS-CoV-2 by FDA under an Emergency Use Authorization (EUA). This EUA will remain  in effect (meaning this test can be used) for the duration of the COVID-19 declaration under Section 564(b)(1) of the Act,  21 U.S.C.section 360bbb-3(b)(1), unless the authorization is terminated  or revoked sooner.       Influenza A by PCR NEGATIVE NEGATIVE Final   Influenza B by PCR NEGATIVE NEGATIVE Final    Comment: (NOTE) The Xpert Xpress SARS-CoV-2/FLU/RSV plus assay is intended as an aid in the diagnosis of influenza from Nasopharyngeal swab specimens and should not be used as a sole basis for treatment. Nasal washings and aspirates are unacceptable for Xpert Xpress SARS-CoV-2/FLU/RSV testing.  Fact Sheet for Patients: BloggerCourse.com  Fact Sheet for Healthcare Providers: SeriousBroker.it  This test is not yet approved or cleared by the [place] FDA and has been authorized for detection and/or diagnosis of SARS-CoV-2 by FDA under an Emergency Use Authorization (EUA). This EUA will remain in effect (meaning this test can be used) for the duration of the COVID-19 declaration under Section 564(b)(1) of the Act, 21 U.S.C. section 360bbb-3(b)(1), unless the authorization is terminated or revoked.  Performed at [facility], [address], [place], [place] [address]       Radiology Studies: CT Head Wo Contrast  Result Date: 01/29/2021 CLINICAL DATA:  Cerebral hemorrhage suspected, abnormal labs EXAM: CT HEAD WITHOUT CONTRAST TECHNIQUE: Contiguous axial images were obtained from the base of the skull through the vertex without intravenous contrast. COMPARISON:  2018 FINDINGS: Brain: There is no acute intracranial hemorrhage, mass effect, or edema. Gray-white differentiation is preserved. There is no extra-axial fluid collection. Patchy and confluent areas of hypoattenuation in the supratentorial white matter nonspecific but probably reflect mild to moderate chronic microvascular ischemic changes. Prominence of the ventricles and sulci reflects mild generalized parenchymal volume loss. Vascular: There is atherosclerotic  calcification at the skull base. Skull: Calvarium is unremarkable. Sinuses/Orbits: No acute finding. Other: None. IMPRESSION: No acute intracranial hemorrhage, mass effect, or evidence of acute infarction. Chronic microvascular ischemic changes. Electronically Signed   By: Guadlupe Spanish M.D.   On: 01/29/2021 14:50      Scheduled Meds: . amLODipine  10 mg Oral Daily  . enoxaparin (LOVENOX) injection  30 mg Subcutaneous Q24H  . insulin aspart  0-9 Units Subcutaneous TID WC  . montelukast  10 mg Oral QHS   Continuous Infusions:   LOS: 0 days      Time spent: 35 minutes   Noralee Stain, DO [facility] 01/30/2021, 12:00 PM   Available via Epic secure chat 7am-7pm After these hours, please refer to coverage provider listed on [email or web address]

## 2021-01-31 LAB — BASIC METABOLIC PANEL
Anion gap: 8 (ref 5–15)
Anion gap: 9 (ref 5–15)
BUN: 25 mg/dL — ABNORMAL HIGH (ref 8–23)
BUN: 22 mg/dL (ref 8–23)
CO2: 27 mmol/L (ref 22–32)
CO2: 26 mmol/L (ref 22–32)
Calcium: 9.2 mg/dL (ref 8.9–10.3)
Calcium: 9.4 mg/dL (ref 8.9–10.3)
Chloride: 102 mmol/L (ref 98–111)
Chloride: 100 mmol/L (ref 98–111)
Creatinine, Ser: 0.75 mg/dL (ref 0.44–1.00)
Creatinine, Ser: 0.88 mg/dL (ref 0.44–1.00)
GFR, Estimated: >60 mL/min (ref >60)
GFR, Estimated: >60 mL/min (ref >60)
Glucose, Bld: 123 mg/dL — ABNORMAL HIGH (ref 70–99)
Glucose, Bld: 140 mg/dL — ABNORMAL HIGH (ref 70–99)
Potassium: 4.1 mmol/L (ref 3.5–5.1)
Potassium: 4.2 mmol/L (ref 3.5–5.1)
Sodium: 137 mmol/L (ref 135–145)
Sodium: 135 mmol/L (ref 135–145)

## 2021-01-31 LAB — CBC
HCT: 28.6 % — ABNORMAL LOW (ref 36.0–46.0)
Hemoglobin: 9.8 g/dL — ABNORMAL LOW (ref 12.0–15.0)
MCH: 30.7 pg (ref 26.0–34.0)
MCHC: 34.3 g/dL (ref 30.0–36.0)
MCV: 89.7 fL (ref 80.0–100.0)
Platelets: 311 10*3/uL (ref 150–400)
RBC: 3.19 MIL/uL — ABNORMAL LOW (ref 3.87–5.11)
RDW: 11.8 % (ref 11.5–15.5)
WBC: 5.8 10*3/uL (ref 4.0–10.5)
nRBC: 0 % (ref 0.0–0.2)

## 2021-01-31 LAB — GLUCOSE, CAPILLARY: Glucose-Capillary: 118 mg/dL — ABNORMAL HIGH (ref 70–99)

## 2021-01-31 NOTE — Discharge Summary (Signed)
Physician Discharge Summary  TATEANNA BACH LEX:[id number] DOB: [birth year] DOA: 01/29/2021  PCP: Jani Gravel, MD  Admit date: 01/29/2021 Discharge date: 01/31/2021  Admitted From: Home Disposition:  Home  Recommendations for Outpatient Follow-up:  1. Follow up with PCP in 1 week  Discharge Condition: Stable CODE STATUS: Full  Diet recommendation:  Diet Orders (From admission, onward)    Start     Ordered   01/29/21 1633  Diet regular Room service appropriate? Yes; Fluid consistency: Thin  Diet effective now       Question Answer Comment  Room service appropriate? Yes   Fluid consistency: Thin      01/29/21 1633         Brief/Interim Summary: RUCHY WILDRICK is an 42 year oldVietnamese-speakingfemale with past medical history of hypertension, diabetes who was seen by her PCP yesterday for 1 week of confusion and was found to have abnormal lab work prompting her to be referred to the ED today.Per grandson, the patient has had a headache for the past week or so and he also noted that she seemed more confused, noted that she was asking the same questions multiple times and not oriented to place. He and the family felt that this was just old age and did not think much of it. The patient went for a routine appointment at her PCP yesterday and this was brought up and so routine lab work was done. She denies any alcohol use, change in appetite or new medications.  She was found to be hyponatremic and was sent to the emergency department for further evaluation and treatment.  Patient was initially given IV fluid for improvement of her hyponatremia.  She had improvement with IV fluid, and then subsequent improvement without IV fluid.  Her work-up did not reveal specific etiology of her hyponatremia.  Urine studies not consistent with SIADH.  I suspect combination of "tea and toast diet" as well as euvolemic polydipsia.  Grandson reported that patient drinking a lot of water at home.  On day of  discharge, patient was feeling well without new complaints.  Eager to discharge home.  Patient's case was discussed with grandson over the phone.  Discharge Diagnoses:  Principal Problem:   Hyponatremia Active Problems:   HYPERTENSION, BENIGN ESSENTIAL   Diabetes mellitus type 2, controlled, without complications (HCC)   Acute metabolic encephalopathy   Acute metabolic encephalopathy -Suspected secondary to hyponatremia, possibly underlying component of dementia -Hold mentation altering drugs including hydroxyzine -Seems to be back to her baseline now  Hypoosmolar hyponatremia -Unclear etiology.  Does not appear to be hypovolemic.  Per grandson, patient was prescribed Zoloft but has not started it yet.  Hold ARB.  TSH within normal limit.  Labs not consistent with SIADH.  Per grandson, patient drinks a lot of water at home, mostly eats bread -Improved with IV fluid, then continue to improve without IV fluid -Resolved  Diabetes -Resume her home medication  Hypertension -Continue Norvasc.  ARB on hold  CKD stage IIIa -Stable  Chronic tension headache -Supportive care    Discharge Instructions  Discharge Instructions    Call MD for:  difficulty breathing, headache or visual disturbances   Complete by: As directed    Call MD for:  extreme fatigue   Complete by: As directed    Call MD for:  persistant dizziness or light-headedness   Complete by: As directed    Call MD for:  persistant nausea and vomiting   Complete by: As directed  Call MD for:  severe uncontrolled pain   Complete by: As directed    Call MD for:  temperature >100.4   Complete by: As directed    Discharge instructions   Complete by: As directed    You were cared for by a hospitalist during your hospital stay. If you have any questions about your discharge medications or the care you received while you were in the hospital after you are discharged, you can call the unit and ask to speak with the  hospitalist on call if the hospitalist that took care of you is not available. Once you are discharged, your primary care physician will handle any further medical issues. Please note that NO REFILLS for any discharge medications will be authorized once you are discharged, as it is imperative that you return to your primary care physician (or establish a relationship with a primary care physician if you do not have one) for your aftercare needs so that they can reassess your need for medications and monitor your lab values.   Increase activity slowly   Complete by: As directed      Allergies as of 01/31/2021      Reactions   Hydrochlorothiazide    Severe hyponatremia resulting in hospitalization   Lisinopril       Medication List    STOP taking these medications   hydrOXYzine 10 MG tablet Commonly known as: ATARAX/VISTARIL   meloxicam 7.5 MG tablet Commonly known as: MOBIC   olmesartan 20 MG tablet Commonly known as: BENICAR     TAKE these medications   acetic acid-hydrocortisone OTIC solution Commonly known as: VOSOL-HC Place 3 drops into the left ear 2 (two) times daily. What changed:   when to take this  reasons to take this   albuterol 108 (90 Base) MCG/ACT inhaler Commonly known as: VENTOLIN HFA Inhale 2 puffs into the lungs every 6 (six) hours as needed for wheezing or shortness of breath.   amLODipine 10 MG tablet Commonly known as: NORVASC TAKE 1 TABLET(10 MG) BY MOUTH DAILY   Blood Pressure Kit Devi 1 Device by Does not apply route daily.   diclofenac Sodium 1 % Gel Commonly known as: VOLTAREN Apply 1 application topically 4 (four) times daily.   FeroSul 325 (65 FE) MG tablet Generic drug: ferrous sulfate Take 325 mg by mouth 2 (two) times daily.   fluticasone 50 MCG/ACT nasal spray Commonly known as: FLONASE Place 1 spray into both nostrils daily.   Fluzone High-Dose Quadrivalent 0.7 ML Susy Generic drug: Influenza Vac High-Dose Quad   glimepiride  1 MG tablet Commonly known as: AMARYL TAKE 1/2 TABLET(0.5 MG) BY MOUTH TWICE DAILY   levocetirizine 5 MG tablet Commonly known as: XYZAL SMARTSIG:1 Tablet(s) By Mouth Every Evening   meclizine 25 MG tablet Commonly known as: ANTIVERT Take 1 tablet (25 mg total) by mouth 3 (three) times daily as needed for dizziness.   montelukast 10 MG tablet Commonly known as: SINGULAIR Take 10 mg by mouth daily.   Senna Plus 8.6-50 MG tablet Generic drug: senna-docusate TAKE 2 TABLETS BY MOUTH 2 TIMES DAILY AS NEEDED FOR CONSTIPATION   sertraline 25 MG tablet Commonly known as: ZOLOFT Take 25 mg by mouth daily.   Systane Nighttime Oint SMARTSIG:1 Inch(es) In Eye(s) Every Night   triamcinolone ointment 0.5 % Commonly known as: KENALOG SMARTSIG:Sparingly Topical Twice Daily   vitamin B-12 1000 MCG tablet Commonly known as: CYANOCOBALAMIN Take 1,000 mcg by mouth daily.  Follow-up Information    Jani Gravel, MD. Schedule an appointment as soon as possible for a visit in 1 week(s).   Specialty: Internal Medicine Contact information: [address] [place] [address] [phone]              Allergies  Allergen Reactions  . Hydrochlorothiazide     Severe hyponatremia resulting in hospitalization  . Lisinopril     Consultations:  None    Procedures/Studies: CT Head Wo Contrast  Result Date: 01/29/2021 CLINICAL DATA:  Cerebral hemorrhage suspected, abnormal labs EXAM: CT HEAD WITHOUT CONTRAST TECHNIQUE: Contiguous axial images were obtained from the base of the skull through the vertex without intravenous contrast. COMPARISON:  2018 FINDINGS: Brain: There is no acute intracranial hemorrhage, mass effect, or edema. Gray-white differentiation is preserved. There is no extra-axial fluid collection. Patchy and confluent areas of hypoattenuation in the supratentorial white matter nonspecific but probably reflect mild to moderate chronic microvascular  ischemic changes. Prominence of the ventricles and sulci reflects mild generalized parenchymal volume loss. Vascular: There is atherosclerotic calcification at the skull base. Skull: Calvarium is unremarkable. Sinuses/Orbits: No acute finding. Other: None. IMPRESSION: No acute intracranial hemorrhage, mass effect, or evidence of acute infarction. Chronic microvascular ischemic changes. Electronically Signed   By: Macy Mis M.D.   On: 01/29/2021 14:50       Discharge Exam: Vitals:   01/30/21 2132 01/31/21 0544  BP: (!) 128/46 (!) 154/62  Pulse: 79 87  Resp: 16 16  Temp: 98.4 F (36.9 C) 99.1 F (37.3 C)  SpO2: 100% 99%    Patient evaluated with iPad interpreter  General: Pt is alert, awake, not in acute distress Cardiovascular: RRR, S1/S2 +, no edema Respiratory: CTA bilaterally, no wheezing, no rhonchi, no respiratory distress, no conversational dyspnea  Abdominal: Soft, NT, ND, bowel sounds + Extremities: no edema, no cyanosis Psych: Normal mood and affect   The results of significant diagnostics from this hospitalization (including imaging, microbiology, ancillary and laboratory) are listed below for reference.     Microbiology: Recent Results (from the past 240 hour(s))  Resp Panel by RT-PCR (Flu A&B, Covid) Nasopharyngeal Swab     Status: None   Collection Time: 01/29/21  4:27 PM   Specimen: Nasopharyngeal Swab; Nasopharyngeal(NP) swabs in vial transport medium  Result Value Ref Range Status   SARS Coronavirus 2 by RT PCR NEGATIVE NEGATIVE Final    Comment: (NOTE) SARS-CoV-2 target nucleic acids are NOT DETECTED.  The SARS-CoV-2 RNA is generally detectable in upper respiratory specimens during the acute phase of infection. The lowest concentration of SARS-CoV-2 viral copies this assay can detect is 138 copies/mL. A negative result does not preclude SARS-Cov-2 infection and should not be used as the sole basis for treatment or other patient management decisions. A  negative result may occur with  improper specimen collection/handling, submission of specimen other than nasopharyngeal swab, presence of viral mutation(s) within the areas targeted by this assay, and inadequate number of viral copies(<138 copies/mL). A negative result must be combined with clinical observations, patient history, and epidemiological information. The expected result is Negative.  Fact Sheet for Patients:  EntrepreneurPulse.com.au  Fact Sheet for Healthcare Providers:  IncredibleEmployment.be  This test is no t yet approved or cleared by the [place] FDA and  has been authorized for detection and/or diagnosis of SARS-CoV-2 by FDA under an Emergency Use Authorization (EUA). This EUA will remain  in effect (meaning this test can be used) for the duration of the  COVID-19 declaration under Section 564(b)(1) of the Act, 21 U.S.C.section 360bbb-3(b)(1), unless the authorization is terminated  or revoked sooner.       Influenza A by PCR NEGATIVE NEGATIVE Final   Influenza B by PCR NEGATIVE NEGATIVE Final    Comment: (NOTE) The Xpert Xpress SARS-CoV-2/FLU/RSV plus assay is intended as an aid in the diagnosis of influenza from Nasopharyngeal swab specimens and should not be used as a sole basis for treatment. Nasal washings and aspirates are unacceptable for Xpert Xpress SARS-CoV-2/FLU/RSV testing.  Fact Sheet for Patients: EntrepreneurPulse.com.au  Fact Sheet for Healthcare Providers: IncredibleEmployment.be  This test is not yet approved or cleared by the [place] FDA and has been authorized for detection and/or diagnosis of SARS-CoV-2 by FDA under an Emergency Use Authorization (EUA). This EUA will remain in effect (meaning this test can be used) for the duration of the COVID-19 declaration under Section 564(b)(1) of the Act, 21 U.S.C. section 360bbb-3(b)(1), unless the authorization  is terminated or revoked.  Performed at [facility], [place] [address], [place], [place] [address]      Labs: BNP (last 3 results) No results for input(s): BNP in the last 8760 hours. Basic Metabolic Panel: Recent Labs  Lab 01/30/21 0746 01/30/21 1340 01/30/21 1344 01/31/21 0101 01/31/21 0739  NA 130* 134* 133* 137 135  K 3.8 3.9 3.9 4.1 4.2  CL 94* 99 98 102 100  CO2 _0 GLUCOSE 89 129* 144* 123* 140*  BUN _1 25* 22  CREATININE 0.81 0.90 0.86 0.75 0.88  CALCIUM 8.8* 9.1 9.1 9.2 9.4   Liver Function Tests: Recent Labs  Lab 01/29/21 1213  AST 26  ALT 19  ALKPHOS 67  BILITOT 0.6  PROT 7.4  ALBUMIN 4.3   No results for input(s): LIPASE, AMYLASE in the last 168 hours. No results for input(s): AMMONIA in the last 168 hours. CBC: Recent Labs  Lab 01/29/21 1213 01/30/21 0328 01/31/21 0101  WBC 7.0 6.0 5.8  NEUTROABS 5.4  --   --   HGB 10.4* 9.0* 9.8*  HCT 29.4* 26.2* 28.6*  MCV 85.0 88.5 89.7  PLT 312 265 311   Cardiac Enzymes: No results for input(s): CKTOTAL, CKMB, CKMBINDEX, TROPONINI in the last 168 hours. BNP: Invalid input(s): POCBNP CBG: Recent Labs  Lab 01/30/21 0727 01/30/21 1153 01/30/21 1648 01/30/21 2131 01/31/21 0744  GLUCAP 77 161* 130* 114* 118*   D-Dimer No results for input(s): DDIMER in the last 72 hours. Hgb A1c No results for input(s): HGBA1C in the last 72 hours. Lipid Profile No results for input(s): CHOL, HDL, LDLCALC, TRIG, CHOLHDL, LDLDIRECT in the last 72 hours. Thyroid function studies Recent Labs    01/29/21 1630  TSH 0.462   Anemia work up No results for input(s): VITAMINB12, FOLATE, FERRITIN, TIBC, IRON, RETICCTPCT in the last 72 hours. Urinalysis    Component Value Date/Time   COLORURINE YELLOW 01/29/2021 1157   APPEARANCEUR CLEAR 01/29/2021 1157   LABSPEC 1.011 01/29/2021 1157   PHURINE 5.0 01/29/2021 1157   GLUCOSEU NEGATIVE 01/29/2021 1157   HGBUR SMALL (A)  01/29/2021 1157   BILIRUBINUR NEGATIVE 01/29/2021 1157   BILIRUBINUR negative 11/08/2018 1136   BILIRUBINUR neg 10/16/2013 1122   KETONESUR NEGATIVE 01/29/2021 1157   PROTEINUR NEGATIVE 01/29/2021 1157   UROBILINOGEN 0.2 11/08/2018 1136   UROBILINOGEN 0.2 06/01/2013 1135   NITRITE NEGATIVE 01/29/2021 1157   LEUKOCYTESUR TRACE (A) 01/29/2021 1157   Sepsis Labs Invalid input(s): PROCALCITONIN,  WBC,  LACTICIDVEN Microbiology Recent Results (from the past 240 hour(s))  Resp Panel by RT-PCR (Flu A&B, Covid) Nasopharyngeal Swab     Status: None   Collection Time: 01/29/21  4:27 PM   Specimen: Nasopharyngeal Swab; Nasopharyngeal(NP) swabs in vial transport medium  Result Value Ref Range Status   SARS Coronavirus 2 by RT PCR NEGATIVE NEGATIVE Final    Comment: (NOTE) SARS-CoV-2 target nucleic acids are NOT DETECTED.  The SARS-CoV-2 RNA is generally detectable in upper respiratory specimens during the acute phase of infection. The lowest concentration of SARS-CoV-2 viral copies this assay can detect is 138 copies/mL. A negative result does not preclude SARS-Cov-2 infection and should not be used as the sole basis for treatment or other patient management decisions. A negative result may occur with  improper specimen collection/handling, submission of specimen other than nasopharyngeal swab, presence of viral mutation(s) within the areas targeted by this assay, and inadequate number of viral copies(<138 copies/mL). A negative result must be combined with clinical observations, patient history, and epidemiological information. The expected result is Negative.  Fact Sheet for Patients:  EntrepreneurPulse.com.au  Fact Sheet for Healthcare Providers:  IncredibleEmployment.be  This test is no t yet approved or cleared by the [place] FDA and  has been authorized for detection and/or diagnosis of SARS-CoV-2 by FDA under an Emergency Use  Authorization (EUA). This EUA will remain  in effect (meaning this test can be used) for the duration of the COVID-19 declaration under Section 564(b)(1) of the Act, 21 U.S.C.section 360bbb-3(b)(1), unless the authorization is terminated  or revoked sooner.       Influenza A by PCR NEGATIVE NEGATIVE Final   Influenza B by PCR NEGATIVE NEGATIVE Final    Comment: (NOTE) The Xpert Xpress SARS-CoV-2/FLU/RSV plus assay is intended as an aid in the diagnosis of influenza from Nasopharyngeal swab specimens and should not be used as a sole basis for treatment. Nasal washings and aspirates are unacceptable for Xpert Xpress SARS-CoV-2/FLU/RSV testing.  Fact Sheet for Patients: EntrepreneurPulse.com.au  Fact Sheet for Healthcare Providers: IncredibleEmployment.be  This test is not yet approved or cleared by the [place] FDA and has been authorized for detection and/or diagnosis of SARS-CoV-2 by FDA under an Emergency Use Authorization (EUA). This EUA will remain in effect (meaning this test can be used) for the duration of the COVID-19 declaration under Section 564(b)(1) of the Act, 21 U.S.C. section 360bbb-3(b)(1), unless the authorization is terminated or revoked.  Performed at [facility], [place] [address], [place], [place] [address]      Patient was seen and examined on the day of discharge and was found to be in stable condition. Time coordinating discharge: 25 minutes including assessment and coordination of care, as well as examination of the patient.   SIGNED:  Dessa Phi, DO [facility] 01/31/2021, 10:17 AM

## 2021-03-05 ENCOUNTER — Other Ambulatory Visit: Payer: Medicare Other

## 2021-08-08 ENCOUNTER — Emergency Department (HOSPITAL_COMMUNITY): Payer: Medicare Other

## 2021-08-08 ENCOUNTER — Inpatient Hospital Stay (HOSPITAL_COMMUNITY)
Admission: EM | Admit: 2021-08-08 | Discharge: 2021-08-12 | DRG: 963 | Disposition: A | Discharge status: Home Health | Payer: Medicare Other | Attending: Family Medicine | Admitting: Family Medicine

## 2021-08-08 ENCOUNTER — Inpatient Hospital Stay (HOSPITAL_COMMUNITY): Payer: Medicare Other

## 2021-08-08 DIAGNOSIS — I248 Other forms of acute ischemic heart disease: Secondary | ICD-10-CM | POA: Diagnosis present

## 2021-08-08 DIAGNOSIS — W1830XA Fall on same level, unspecified, initial encounter: Secondary | ICD-10-CM | POA: Diagnosis present

## 2021-08-08 DIAGNOSIS — Z981 Arthrodesis status: Secondary | ICD-10-CM

## 2021-08-08 DIAGNOSIS — S72001A Fracture of unspecified part of neck of right femur, initial encounter for closed fracture: Secondary | ICD-10-CM | POA: Diagnosis present

## 2021-08-08 DIAGNOSIS — E1122 Type 2 diabetes mellitus with diabetic chronic kidney disease: Secondary | ICD-10-CM | POA: Diagnosis present

## 2021-08-08 DIAGNOSIS — Z888 Allergy status to other drugs, medicaments and biological substances status: Secondary | ICD-10-CM

## 2021-08-08 DIAGNOSIS — Z20822 Contact with and (suspected) exposure to covid-19: Secondary | ICD-10-CM | POA: Diagnosis present

## 2021-08-08 DIAGNOSIS — E871 Hypo-osmolality and hyponatremia: Secondary | ICD-10-CM | POA: Diagnosis present

## 2021-08-08 DIAGNOSIS — S0003XA Contusion of scalp, initial encounter: Secondary | ICD-10-CM

## 2021-08-08 DIAGNOSIS — M1990 Unspecified osteoarthritis, unspecified site: Secondary | ICD-10-CM | POA: Diagnosis present

## 2021-08-08 DIAGNOSIS — E876 Hypokalemia: Secondary | ICD-10-CM

## 2021-08-08 DIAGNOSIS — Y9301 Activity, walking, marching and hiking: Secondary | ICD-10-CM | POA: Diagnosis present

## 2021-08-08 DIAGNOSIS — G9341 Metabolic encephalopathy: Secondary | ICD-10-CM | POA: Diagnosis present

## 2021-08-08 DIAGNOSIS — Z66 Do not resuscitate: Secondary | ICD-10-CM | POA: Diagnosis present

## 2021-08-08 DIAGNOSIS — W19XXXA Unspecified fall, initial encounter: Secondary | ICD-10-CM

## 2021-08-08 DIAGNOSIS — E119 Type 2 diabetes mellitus without complications: Secondary | ICD-10-CM

## 2021-08-08 DIAGNOSIS — I129 Hypertensive chronic kidney disease with stage 1 through stage 4 chronic kidney disease, or unspecified chronic kidney disease: Secondary | ICD-10-CM | POA: Diagnosis present

## 2021-08-08 DIAGNOSIS — E1169 Type 2 diabetes mellitus with other specified complication: Secondary | ICD-10-CM

## 2021-08-08 DIAGNOSIS — Z79899 Other long term (current) drug therapy: Secondary | ICD-10-CM

## 2021-08-08 DIAGNOSIS — S065X9A Traumatic subdural hemorrhage with loss of consciousness of unspecified duration, initial encounter: Principal | ICD-10-CM | POA: Diagnosis present

## 2021-08-08 DIAGNOSIS — S065XAA Traumatic subdural hemorrhage with loss of consciousness status unknown, initial encounter: Secondary | ICD-10-CM | POA: Diagnosis present

## 2021-08-08 DIAGNOSIS — N182 Chronic kidney disease, stage 2 (mild): Secondary | ICD-10-CM | POA: Diagnosis present

## 2021-08-08 DIAGNOSIS — I10 Essential (primary) hypertension: Secondary | ICD-10-CM | POA: Diagnosis present

## 2021-08-08 DIAGNOSIS — D638 Anemia in other chronic diseases classified elsewhere: Secondary | ICD-10-CM | POA: Diagnosis present

## 2021-08-08 DIAGNOSIS — G894 Chronic pain syndrome: Secondary | ICD-10-CM | POA: Diagnosis present

## 2021-08-08 LAB — COMPREHENSIVE METABOLIC PANEL
ALT: 14 U/L (ref 0–44)
AST: 30 U/L (ref 15–41)
Albumin: 3.4 g/dL — ABNORMAL LOW (ref 3.5–5.0)
Alkaline Phosphatase: 62 U/L (ref 38–126)
Anion gap: 14 (ref 5–15)
BUN: 26 mg/dL — ABNORMAL HIGH (ref 8–23)
CO2: 25 mmol/L (ref 22–32)
Calcium: 8.8 mg/dL — ABNORMAL LOW (ref 8.9–10.3)
Chloride: 76 mmol/L — ABNORMAL LOW (ref 98–111)
Creatinine, Ser: 1.2 mg/dL — ABNORMAL HIGH (ref 0.44–1.00)
GFR, Estimated: 44 mL/min — ABNORMAL LOW (ref >60)
Glucose, Bld: 112 mg/dL — ABNORMAL HIGH (ref 70–99)
Potassium: 3.1 mmol/L — ABNORMAL LOW (ref 3.5–5.1)
Sodium: 115 mmol/L — CL (ref 135–145)
Total Bilirubin: 0.6 mg/dL (ref 0.3–1.2)
Total Protein: 6.4 g/dL — ABNORMAL LOW (ref 6.5–8.1)

## 2021-08-08 LAB — TROPONIN I (HIGH SENSITIVITY)
Troponin I (High Sensitivity): 38 ng/L — ABNORMAL HIGH (ref <18)
Troponin I (High Sensitivity): 40 ng/L — ABNORMAL HIGH (ref <18)

## 2021-08-08 LAB — RESP PANEL BY RT-PCR (FLU A&B, COVID) ARPGX2
Influenza A by PCR: NEGATIVE
Influenza B by PCR: NEGATIVE
SARS Coronavirus 2 by RT PCR: NEGATIVE

## 2021-08-08 LAB — CBC WITH DIFFERENTIAL/PLATELET
Abs Immature Granulocytes: 0.06 10*3/uL (ref 0.00–0.07)
Basophils Absolute: 0.1 10*3/uL (ref 0.0–0.1)
Basophils Relative: 1 %
Eosinophils Absolute: 0.4 10*3/uL (ref 0.0–0.5)
Eosinophils Relative: 5 %
HCT: 26.6 % — ABNORMAL LOW (ref 36.0–46.0)
Hemoglobin: 9.2 g/dL — ABNORMAL LOW (ref 12.0–15.0)
Immature Granulocytes: 1 %
Lymphocytes Relative: 13 %
Lymphs Abs: 1.1 10*3/uL (ref 0.7–4.0)
MCH: 31 pg (ref 26.0–34.0)
MCHC: 34.6 g/dL (ref 30.0–36.0)
MCV: 89.6 fL (ref 80.0–100.0)
Monocytes Absolute: 0.8 10*3/uL (ref 0.1–1.0)
Monocytes Relative: 11 %
Neutro Abs: 5.6 10*3/uL (ref 1.7–7.7)
Neutrophils Relative %: 69 %
Platelets: 242 10*3/uL (ref 150–400)
RBC: 2.97 MIL/uL — ABNORMAL LOW (ref 3.87–5.11)
RDW: 11.9 % (ref 11.5–15.5)
WBC: 7.9 10*3/uL (ref 4.0–10.5)
nRBC: 0 % (ref 0.0–0.2)

## 2021-08-08 LAB — CBG MONITORING, ED: Glucose-Capillary: 115 mg/dL — ABNORMAL HIGH (ref 70–99)

## 2021-08-08 LAB — SODIUM: Sodium: 120 mmol/L — ABNORMAL LOW (ref 135–145)

## 2021-08-08 MED ORDER — POTASSIUM CHLORIDE 10 MEQ/100ML IV SOLN
10.0000 meq | INTRAVENOUS | Status: AC
  Administered 2021-08-09 (×4): 10 meq via INTRAVENOUS
  Filled 2021-08-08 (×4): qty 100

## 2021-08-08 MED ORDER — POLYETHYLENE GLYCOL 3350 17 G PO PACK: 17.0000 g | PACK | Freq: Every day | ORAL | Status: DC | PRN

## 2021-08-08 MED ORDER — PANTOPRAZOLE SODIUM 40 MG PO TBEC
40.0000 mg | DELAYED_RELEASE_TABLET | Freq: Every day | ORAL | Status: DC
  Administered 2021-08-09 – 2021-08-12 (×4): 40 mg via ORAL
  Filled 2021-08-08 (×4): qty 1

## 2021-08-08 MED ORDER — SODIUM CHLORIDE 3 % IV SOLN
INTRAVENOUS | Status: DC
  Filled 2021-08-08 (×2): qty 500

## 2021-08-08 MED ORDER — ACETAMINOPHEN 325 MG PO TABS
650.0000 mg | ORAL_TABLET | Freq: Four times a day (QID) | ORAL | Status: DC | PRN
  Administered 2021-08-09 – 2021-08-11 (×6): 650 mg via ORAL
  Filled 2021-08-08 (×6): qty 2

## 2021-08-08 MED ORDER — DOCUSATE SODIUM 100 MG PO CAPS: 100.0000 mg | ORAL_CAPSULE | Freq: Two times a day (BID) | ORAL | Status: DC | PRN

## 2021-08-08 MED ORDER — INSULIN ASPART 100 UNIT/ML IJ SOLN
0.0000 [IU] | INTRAMUSCULAR | Status: DC
  Administered 2021-08-09: 5 [IU] via SUBCUTANEOUS
  Administered 2021-08-10 (×2): 1 [IU] via SUBCUTANEOUS
  Administered 2021-08-10 – 2021-08-11 (×3): 2 [IU] via SUBCUTANEOUS
  Administered 2021-08-11 – 2021-08-12 (×3): 1 [IU] via SUBCUTANEOUS
  Administered 2021-08-12: 2 [IU] via SUBCUTANEOUS

## 2021-08-08 NOTE — Progress Notes (Signed)
Called in regards to this patient's head ct which showed a very small parafalcine SDH. Patient is not on any blood thinners. No surgical intervention warranted. Can repeat head CT in the morning to make sure it is stable.

## 2021-08-08 NOTE — ED Notes (Signed)
Patient transported to CT 

## 2021-08-08 NOTE — ED Notes (Signed)
Sodium 115, MD made aware

## 2021-08-08 NOTE — ED Triage Notes (Signed)
Pt bib [facility] from home c/o headache secondary to fall. Per ems, pt fell 2 days ago at home. Pt lives at home by herself, but has family/ neighbors checks on her regularly, today they noticed that pt's breathing was different so they called the ems. Redness &swelling noted on pt's posterior head. Not on blood thinner.  Pt also stated that " she"ll die if she lives by herself."   - no meds given by ems   Vitals with ems:   BP 143/73 Pulse 78  O2 96% RA

## 2021-08-08 NOTE — ED Provider Notes (Signed)
[facility] EMERGENCY DEPARTMENT Provider Note   CSN: [id number] Arrival date & time: 08/08/21  1836     History No chief complaint on file.   Robin Andrews is a 87 y.o. female.  Robin Andrews was brought in by EMS after falling 2 days ago.  Neighbors called EMS, and they noted that her breathing was different than usual.  The patient is unable to provide history even with an interpreter.  She is able to say that she is tired and that she hurts all over.  The history is provided by the patient. The history is limited by the condition of the patient. A language interpreter was used.  Fall This is a new problem. The current episode started 2 days ago. Episode frequency: one fall. The problem has been resolved. Associated symptoms comments: Hurts all over. Nothing aggravates the symptoms. Nothing relieves the symptoms. She has tried nothing for the symptoms. The treatment provided no relief.      Past Medical History:  Diagnosis Date  . Degenerative disc disease   . Diabetes mellitus without complication ([place])   . Hypertension   . Pruritus     Patient Active Problem List   Diagnosis Date Noted  . Hyponatremia 01/29/2021  . Acute metabolic encephalopathy 38/18/2993  . Vertigo 04/18/2020  . Noninfectious otitis externa of right ear 04/18/2020  . Chronic neck pain 11/09/2019  . Chronic bilateral low back pain without sciatica 11/09/2019  . Diabetes mellitus type 2, controlled, without complications ([place]) 71/69/6789  . Cardiac murmur 12/18/2017  . Cervical disc disease 05/25/2012  . Chronic pain syndrome 05/25/2012  . Osteoarthritis 05/25/2012  . Hyperlipidemia 05/30/2007  . HYPERTENSION, BENIGN ESSENTIAL 05/30/2007  . ASTEATOTIC ECZEMA 05/30/2007    Past Surgical History:  Procedure Laterality Date  . ANTERIOR CERVICAL DECOMP/DISCECTOMY FUSION  02/2010   C3-6/notes 03/09/2010  . BACK SURGERY    . DILATION AND CURETTAGE, DIAGNOSTIC / THERAPEUTIC        OB History   No obstetric history on file.     Family History  Family history unknown: Yes    Social History   Tobacco Use  . Smoking status: Never  . Smokeless tobacco: Never  Vaping Use  . Vaping Use: Never used  Substance Use Topics  . Alcohol use: No  . Drug use: No    Home Medications Prior to Admission medications   Medication Sig Start Date End Date Taking? Authorizing Provider  acetic acid-hydrocortisone (VOSOL-HC) OTIC solution Place 3 drops into the left ear 2 (two) times daily. Patient taking differently: Place 3 drops into the left ear 2 (two) times daily as needed (dry eyes). 04/18/20   Wendall Mola, NP  albuterol (PROVENTIL HFA;VENTOLIN HFA) 108 (90 Base) MCG/ACT inhaler Inhale 2 puffs into the lungs every 6 (six) hours as needed for wheezing or shortness of breath. 01/12/19   Jacelyn Pi, Lilia Argue, MD  amLODipine (NORVASC) 10 MG tablet TAKE 1 TABLET(10 MG) BY MOUTH DAILY 06/27/20   Jacelyn Pi, Lilia Argue, MD  Blood Pressure Monitoring (BLOOD PRESSURE KIT) DEVI 1 Device by Does not apply route daily. 10/17/18   Tenna Delaine D, PA-C  diclofenac Sodium (VOLTAREN) 1 % GEL Apply 1 application topically 4 (four) times daily. 12/31/20   [provider]  FEROSUL 325 (65 Fe) MG tablet Take 325 mg by mouth 2 (two) times daily. 11/18/20   [provider]  fluticasone (FLONASE) 50 MCG/ACT nasal spray Place 1 spray into both  nostrils daily. 10/29/20   [provider]  FLUZONE HIGH-DOSE QUADRIVALENT 0.7 ML SUSY  09/26/19   [provider]  glimepiride (AMARYL) 1 MG tablet TAKE 1/2 TABLET(0.5 MG) BY MOUTH TWICE DAILY 06/27/20   Jacelyn Pi, Lilia Argue, MD  levocetirizine (XYZAL) 5 MG tablet SMARTSIG:1 Tablet(s) By Mouth Every Evening 10/08/20   [provider]  meclizine (ANTIVERT) 25 MG tablet Take 1 tablet (25 mg total) by mouth 3 (three) times daily as needed for dizziness. 04/18/20   Wendall Mola, NP  montelukast  (SINGULAIR) 10 MG tablet Take 10 mg by mouth daily. 12/16/20   [provider]  SENNA PLUS 8.6-50 MG tablet TAKE 2 TABLETS BY MOUTH 2 TIMES DAILY AS NEEDED FOR CONSTIPATION 10/29/20   [provider]  sertraline (ZOLOFT) 25 MG tablet Take 25 mg by mouth daily. 01/28/21   [provider]  triamcinolone ointment (KENALOG) 0.5 % SMARTSIG:Sparingly Topical Twice Daily 10/14/20   [provider]  vitamin B-12 (CYANOCOBALAMIN) 1000 MCG tablet Take 1,000 mcg by mouth daily. 11/18/20   [provider]  White Petrolatum-Mineral Oil (SYSTANE NIGHTTIME) OINT SMARTSIG:1 Inch(es) In Eye(s) Every Night 01/10/20   [provider]    Allergies    Hydrochlorothiazide and Lisinopril  Review of Systems   Review of Systems  Unable to perform ROS: Mental status change  Constitutional:  Positive for fatigue.   Physical Exam Updated Vital Signs BP (!) 161/59   Pulse 72   Temp 98.1 F (36.7 C) (Oral)   Resp (!) 23   SpO2 100%   Physical Exam Constitutional:      Comments: Thin, disheveled  HENT:     Head:     Comments: Large hematoma at the posterior head    Nose: Nose normal.     Mouth/Throat:     Mouth: Mucous membranes are dry.     Comments: edentulous Eyes:     Conjunctiva/sclera: Conjunctivae normal.     Pupils: Pupils are equal, round, and reactive to light.  Cardiovascular:     Rate and Rhythm: Normal rate and regular rhythm.     Heart sounds: Normal heart sounds.  Pulmonary:     Effort: Pulmonary effort is normal.     Breath sounds: Normal breath sounds.  Abdominal:     General: There is no distension.     Tenderness: There is abdominal tenderness (mild, diffuse). There is no guarding or rebound.  Musculoskeletal:        General: No deformity or signs of injury.     Cervical back: No tenderness.     Comments: Hip ROM normal  Neurological:     Mental Status: She is disoriented.     Comments: Unable to cooperate fully with neuro  exam. Thinks she is in a nursing home. When asked the date, she said "today." She cannot state her name or birthday.   She is able to pull herself to sit upright. Will not follow other commands but appears to be moving all extremities and is responsive to light touch throughout.  Psychiatric:        Attention and Perception: She is inattentive.        Mood and Affect: Affect normal.        Cognition and Memory: Cognition is impaired.    ED Results / Procedures / Treatments   Labs (all labs ordered are listed, but only abnormal results are displayed) Labs Reviewed  COMPREHENSIVE METABOLIC PANEL - Abnormal; Notable for the  following components:      Result Value   Sodium 115 (*)    Potassium 3.1 (*)    Chloride 76 (*)    Glucose, Bld 112 (*)    BUN 26 (*)    Creatinine, Ser 1.20 (*)    Calcium 8.8 (*)    Total Protein 6.4 (*)    Albumin 3.4 (*)    GFR, Estimated 44 (*)    All other components within normal limits  CBC WITH DIFFERENTIAL/PLATELET - Abnormal; Notable for the following components:   RBC 2.97 (*)    Hemoglobin 9.2 (*)    HCT 26.6 (*)    All other components within normal limits  SODIUM - Abnormal; Notable for the following components:   Sodium 120 (*)    All other components within normal limits  CBG MONITORING, ED - Abnormal; Notable for the following components:   Glucose-Capillary 115 (*)    All other components within normal limits  TROPONIN I (HIGH SENSITIVITY) - Abnormal; Notable for the following components:   Troponin I (High Sensitivity) 38 (*)    All other components within normal limits  RESP PANEL BY RT-PCR (FLU A&B, COVID) ARPGX2  URINALYSIS, ROUTINE W REFLEX MICROSCOPIC  SODIUM  SODIUM  OSMOLALITY  OSMOLALITY, URINE  SODIUM, URINE, RANDOM  CBC  BASIC METABOLIC PANEL  MAGNESIUM  PHOSPHORUS  MAGNESIUM  TSH  CK  CORTISOL  PROTIME-INR  HEMOGLOBIN A1C  TROPONIN I (HIGH SENSITIVITY)    EKG EKG Interpretation  Date/Time:  Friday  August 08 2021 20:21:21 EDT Ventricular Rate:  76 PR Interval:  172 QRS Duration: 82 QT Interval:  409 QTC Calculation: 460 R Axis:   53 Text Interpretation: Sinus rhythm Low voltage, precordial leads normal axis No acute ischemia Confirmed by Lorre Munroe ([id number]) on 08/08/2021 8:57:20 PM  Radiology DG Chest 1 View  Result Date: 08/08/2021 CLINICAL DATA:  Fall EXAM: CHEST  1 VIEW COMPARISON:  10/13/2018 FINDINGS: Patchy atelectasis or minimal infiltrate at lingula. No pleural effusion. Normal cardiac size. Aortic atherosclerosis. No pneumothorax IMPRESSION: Patchy atelectasis or minimal infiltrate at the lingula. Electronically Signed   By: Donavan Foil M.D.   On: 08/08/2021 20:19   CT Head Wo Contrast  Result Date: 08/08/2021 CLINICAL DATA:  Head neck trauma EXAM: CT HEAD WITHOUT CONTRAST CT CERVICAL SPINE WITHOUT CONTRAST TECHNIQUE: Multidetector CT imaging of the head and cervical spine was performed following the standard protocol without intravenous contrast. Multiplanar CT image reconstructions of the cervical spine were also generated. COMPARISON:  CT brain 01/29/2021, CT brain and cervical spine 02/05/2017 FINDINGS: CT HEAD FINDINGS Brain: No acute territorial infarction or intracranial mass is visualized. Trace 3 mm left posterior parafalcine subdural hematoma, series 2, image 18, without mass effect or midline shift. Atrophy and chronic small vessel ischemic changes of the white matter. Stable ventricle size. Vascular: No hyperdense vessel.  Carotid vascular calcification Skull: No depressed skull fracture Sinuses/Orbits: No acute finding. Other: Large right parietooccipital scalp hematoma CT CERVICAL SPINE FINDINGS Alignment: Straightening of the cervical spine. No subluxation. Facet alignment is maintained Skull base and vertebrae: No acute fracture. No primary bone lesion or focal pathologic process. Soft tissues and spinal canal: No prevertebral fluid or swelling. No visible canal  hematoma. Disc levels: Post fusion changes C3 through C6 with anterior plate and fixating screws. Solid bone fusion is present. Moderate degenerative change at C6-C7. Facet degenerative changes at multiple levels with foraminal narrowing. Upper chest: Lung apices clear. 2.5 cm hypodense nodule in  the right lobe of thyroid. In the setting of significant comorbidities or limited life expectancy, no follow-up recommended (ref: J Am Coll Radiol. 2015 Feb;12(2): 143-50). Other: None IMPRESSION: 1. Acute 3 mm left posterior parafalcine subdural hematoma without significant mass effect or midline shift. Atrophy and chronic small vessel ischemic changes of the white matter. 2. Large right parietooccipital scalp hematoma 3. Post fusion changes of cervical spine C3 through C6. No acute osseous abnormality Critical Value/emergent results were called by telephone at the time of interpretation on 08/08/2021 at 8:36 pm to provider [facility] , who verbally acknowledged these results. Electronically Signed   By: Donavan Foil M.D.   On: 08/08/2021 20:36   CT Cervical Spine Wo Contrast  Result Date: 08/08/2021 CLINICAL DATA:  Head neck trauma EXAM: CT HEAD WITHOUT CONTRAST CT CERVICAL SPINE WITHOUT CONTRAST TECHNIQUE: Multidetector CT imaging of the head and cervical spine was performed following the standard protocol without intravenous contrast. Multiplanar CT image reconstructions of the cervical spine were also generated. COMPARISON:  CT brain 01/29/2021, CT brain and cervical spine 02/05/2017 FINDINGS: CT HEAD FINDINGS Brain: No acute territorial infarction or intracranial mass is visualized. Trace 3 mm left posterior parafalcine subdural hematoma, series 2, image 18, without mass effect or midline shift. Atrophy and chronic small vessel ischemic changes of the white matter. Stable ventricle size. Vascular: No hyperdense vessel.  Carotid vascular calcification Skull: No depressed skull fracture Sinuses/Orbits: No acute  finding. Other: Large right parietooccipital scalp hematoma CT CERVICAL SPINE FINDINGS Alignment: Straightening of the cervical spine. No subluxation. Facet alignment is maintained Skull base and vertebrae: No acute fracture. No primary bone lesion or focal pathologic process. Soft tissues and spinal canal: No prevertebral fluid or swelling. No visible canal hematoma. Disc levels: Post fusion changes C3 through C6 with anterior plate and fixating screws. Solid bone fusion is present. Moderate degenerative change at C6-C7. Facet degenerative changes at multiple levels with foraminal narrowing. Upper chest: Lung apices clear. 2.5 cm hypodense nodule in the right lobe of thyroid. In the setting of significant comorbidities or limited life expectancy, no follow-up recommended (ref: J Am Coll Radiol. 2015 Feb;12(2): 143-50). Other: None IMPRESSION: 1. Acute 3 mm left posterior parafalcine subdural hematoma without significant mass effect or midline shift. Atrophy and chronic small vessel ischemic changes of the white matter. 2. Large right parietooccipital scalp hematoma 3. Post fusion changes of cervical spine C3 through C6. No acute osseous abnormality Critical Value/emergent results were called by telephone at the time of interpretation on 08/08/2021 at 8:36 pm to provider [facility] , who verbally acknowledged these results. Electronically Signed   By: Donavan Foil M.D.   On: 08/08/2021 20:36   DG HIP UNILAT WITH PELVIS 2-3 VIEWS LEFT  Result Date: 08/08/2021 CLINICAL DATA:  Fall EXAM: DG HIP (WITH OR WITHOUT PELVIS) 2-3V LEFT COMPARISON:  None. FINDINGS: Left pubic rami appear intact. No definitive fracture or malalignment. IMPRESSION: No definite acute osseous abnormality. Electronically Signed   By: Donavan Foil M.D.   On: 08/08/2021 20:20   DG HIP UNILAT WITH PELVIS 2-3 VIEWS RIGHT  Result Date: 08/08/2021 CLINICAL DATA:  Fall EXAM: DG HIP (WITH OR WITHOUT PELVIS) 2-3V RIGHT COMPARISON:  None. FINDINGS:  SI joints are non widened. Pubic symphysis and rami appear intact. Limited evaluation of femoral neck, question step-off deformity at the base of the femoral neck IMPRESSION: Questionable step-off deformity/fracture at base of femoral neck. Suggest CT for further evaluation. Electronically Signed   By: Maudie Mercury  Francoise Ceo M.D.   On: 08/08/2021 20:22    Procedures .Critical Care  Date/Time: 08/08/2021 11:05 PM Performed by: Arnaldo Natal, MD Authorized by: Arnaldo Natal, MD   Critical care provider statement:    Critical care time (minutes):  60   Critical care time was exclusive of:  Separately billable procedures and treating other patients   Critical care was necessary to treat or prevent imminent or life-threatening deterioration of the following conditions:  CNS failure or compromise and metabolic crisis   Critical care was time spent personally by me on the following activities:  Blood draw for specimens, development of treatment plan with patient or surrogate, discussions with consultants, evaluation of patient's response to treatment, examination of patient, obtaining history from patient or surrogate, ordering and performing treatments and interventions, ordering and review of laboratory studies, ordering and review of radiographic studies, pulse oximetry, re-evaluation of patient's condition and review of old charts   Care discussed with: admitting provider     Medications Ordered in ED Medications - No data to display  ED Course  I have reviewed the triage vital signs and the nursing notes.  Pertinent labs & imaging results that were available during my care of the patient were reviewed by me and considered in my medical decision making (see chart for details).  Clinical Course as of 08/08/21 2310  Fri Aug 08, 2021  2300 I spoke with Dr. Earlie Server. [AW]  2309 I spoke with neurosurgery. Will follow. No acute intervention needed.  [AW]    Clinical Course User Index [AW] Arnaldo Natal,  MD   MDM Rules/Calculators/A&P                           Christophe Louis presented with altered mental status and a fall. She was quite confused but did not have any obvious focal neurologic deficits within the confines of her ability to cooperate.  She was found to have severe hyponatremia and a small subdural hematoma without evidence of shift. She was given 3% NS, and neurosurgery as well as pulmonary critical care were consulted. She will be admitted for further management.   Further trauma work-up revealed a possible hip fracture, and CT is pending.   Final Clinical Impression(s) / ED Diagnoses Final diagnoses:  Hyponatremia  Subdural hematoma ([place])  Controlled type 2 diabetes mellitus without complication, without long-term current use of insulin ([facility])  HYPERTENSION, BENIGN ESSENTIAL    Rx / DC Orders ED Discharge Orders     None        Arnaldo Natal, MD 08/08/21 2310

## 2021-08-08 NOTE — H&P (Addendum)
NAME:  Robin Andrews, MRN:  [id number], DOB:  Dec 11, 1931, LOS: 0 ADMISSION DATE:  08/08/2021, CONSULTATION DATE:   REFERRING MD:  Joya Gaskins MD, CHIEF COMPLAINT:  Fall   History of Present Illness:   Robin Andrews ,is a 87 y.o. female, who presented to the [facility] ED with a chief complaint of fall  They have a pertinent past medical history of HTN, DM2, and CKD.  A translator was used [phone]) for the HPI and exam. The patient was confused and repetitive at times during exam limiting accuracy. No family present at bedside.  History was obtained from exam and chart review.  [name] reported falling some time between 1-2 days ago while walking to the bathroom. She called EMS the evening of 8/19. At the [facility] ED she was found to be hyponatremic (NA 115) a parafalcine SDH was seen on [facility] for which neurosurgery was consulted. She was also found to have a scalp hematoma and a questionable right femoral neck fracture. She was a GCS of 15 on exam but had periods of confusion and repetitiveness upon exam  PCCM was consulted for admission.    Pertinent  Medical History  HTN, DM, CKDIII  Significant Hospital Events: Including procedures, antibiotic start and stop dates in addition to other pertinent events   8/19 Presented, NA 115, Small SDH on CT, NSGY consulted, Admit to ICU  Interim History / Subjective:  See above  Unable to obtain subjective evaluation due to patient confusion. Denies chest pain and SOB.  Objective   Blood pressure (!) 138/58, pulse 75, temperature 98.1 F (36.7 C), temperature source Oral, resp. rate 19, SpO2 98 %. On RA       No intake or output data in the 24 hours ending 08/08/21 2203 There were no vitals filed for this visit.  Examination: General:  in bed, in no acute distress, thin HEENT: MM pink/moist, anicteric, right scalp edematous Neuro: GCS 15, repetitive, confused at times in exam, RASS 0, PERRL 46m CV: S1S2, NSR, no m/r/g appreciated PULM:  clear in the upper  lobes and in the lower lobes, Trachea midline, chest expansion symmetric GI: soft, bsx4 active, non distended  Extremities: warm/dry, no pretibial edema, capillary refill less than 3 seconds  Skin: no rashes or lesions noted  Resolved Hospital Problem list     Assessment & Plan:   Hyponatremia Initial NA 115, now 120. Admitted 2/9-11/22 with Hyponatremia of unclear etiology. Labs were not consistent with SIADH. Previously reported in notes that patient drinks a lot of water at home and mostly eats bread. Suspect tea and toast hyponatremia. P -q4h sodium checks. Goal NA in initial 24 hours 119-121. Monitor. -Neuro watch -Follow up on Serum osm, urine osm, and urine sodium. -Check TSH and random cortisol -No free H2O at this time  [facility] with [facility] trauma ?Acute metabolic encephalopathy- secondary to hyponatremia vs SDH Patient reportedly not on blood thinners. No fever on admission. No WBC elevation. p -NSGY consulted. No surgical intervantion warrented at this time per note -AM head CT scan -Seizure PPX per NSGY -Hyponatremia interventions as discussed -Neuro watch -Admit to ICU -Follow up UA/UC  Hypokalemia p -Goal K above 4, goal MG above 2 -Replete, follow up on AM labs -Follow up MG level  Mechanical fall- per patient fell while walking to bathroom. Unclear downtime. Patient stated two days initially but later said one day. ? Right femoral neck fracture- seen on xray  p -Obtain CT of hip per recs -Ortho consult in AM after CT obtained -Check CK -PT/OT when appropriate -Tylenol for pain  Scalp hematoma Secondary to fall P -Monitor  Anemia, normocytic HGB 9.2, Unclear if secondary to trauma or chronic. -follow up H/H -transfuse for Hgb <7%, active bleeding -Monitor for bleeding  Troponin elevation Suspect secondary to stress. Denies chest pain. -Monitor clinical exam  HX HTN p -Holding home antihypertensives  at this time -Goal SBP less than 160  HX CKD stage III p -Avoid neprotoxic drugs as possible. -Strict I&O's -Follow up AM creatinine  HX DM2 p -Blood Glucose goal 140-180. -SSI  Best Practice (right click and "Reselect all SmartList Selections" daily)   Diet/type: NPO DVT prophylaxis: SCD GI prophylaxis: H2B Lines: N/A Foley:  N/A Code Status:  full code Last date of multidisciplinary goals of care discussion [pending]  Labs   CBC: Recent Labs  Lab 08/08/21 1921  WBC 7.9  NEUTROABS 5.6  HGB 9.2*  HCT 26.6*  MCV 89.6  PLT 539    Basic Metabolic Panel: Recent Labs  Lab 08/08/21 1921  NA 115*  K 3.1*  CL 76*  CO2 25  GLUCOSE 112*  BUN 26*  CREATININE 1.20*  CALCIUM 8.8*   GFR: CrCl cannot be calculated (Unknown ideal weight.). Recent Labs  Lab 08/08/21 1921  WBC 7.9    Liver Function Tests: Recent Labs  Lab 08/08/21 1921  AST 30  ALT 14  ALKPHOS 62  BILITOT 0.6  PROT 6.4*  ALBUMIN 3.4*   No results for input(s): LIPASE, AMYLASE in the last 168 hours. No results for input(s): AMMONIA in the last 168 hours.  ABG    Component Value Date/Time   TCO2 31 10/04/2018 1032     Coagulation Profile: No results for input(s): INR, PROTIME in the last 168 hours.  Cardiac Enzymes: No results for input(s): CKTOTAL, CKMB, CKMBINDEX, TROPONINI in the last 168 hours.  HbA1C: Hgb A1c MFr Bld  Date/Time Value Ref Range Status  06/27/2020 04:48 PM 4.7 (L) 4.8 - 5.6 % Final    Comment:             Prediabetes: 5.7 - 6.4          Diabetes: >6.4          Glycemic control for adults with diabetes: <7.0   11/09/2019 10:36 AM 4.9 4.8 - 5.6 % Final    Comment:             Prediabetes: 5.7 - 6.4          Diabetes: >6.4          Glycemic control for adults with diabetes: <7.0     CBG: Recent Labs  Lab 08/08/21 1909  GLUCAP 115*    Review of Systems:   Unable to obtain a complete ROS due to patient confusion. Denies poly uria and polyphagia.    Past Medical History:  She,  has a past medical history of Degenerative disc disease, Diabetes mellitus without complication ([place]), Hypertension, and Pruritus.   Surgical History:   Past Surgical History:  Procedure Laterality Date   ANTERIOR CERVICAL DECOMP/DISCECTOMY FUSION  02/2010   C3-6/notes 03/09/2010   BACK SURGERY     DILATION AND CURETTAGE, DIAGNOSTIC / THERAPEUTIC       Social History:   reports that she has never smoked. She has never used smokeless tobacco. She reports that she does not drink alcohol and does not use drugs.  Family History:  Her Family history is unknown by patient.   Allergies Allergies  Allergen Reactions   Hydrochlorothiazide     Severe hyponatremia resulting in hospitalization   Lisinopril      Home Medications  Prior to Admission medications   Medication Sig Start Date End Date Taking? Authorizing Provider  acetic acid-hydrocortisone (VOSOL-HC) OTIC solution Place 3 drops into the left ear 2 (two) times daily. Patient taking differently: Place 3 drops into the left ear 2 (two) times daily as needed (dry eyes). 04/18/20   Wendall Mola, NP  albuterol (PROVENTIL HFA;VENTOLIN HFA) 108 (90 Base) MCG/ACT inhaler Inhale 2 puffs into the lungs every 6 (six) hours as needed for wheezing or shortness of breath. 01/12/19   Jacelyn Pi, Lilia Argue, MD  amLODipine (NORVASC) 10 MG tablet TAKE 1 TABLET(10 MG) BY MOUTH DAILY 06/27/20   Jacelyn Pi, Lilia Argue, MD  Blood Pressure Monitoring (BLOOD PRESSURE KIT) DEVI 1 Device by Does not apply route daily. 10/17/18   Tenna Delaine D, PA-C  diclofenac Sodium (VOLTAREN) 1 % GEL Apply 1 application topically 4 (four) times daily. 12/31/20   [provider]  FEROSUL 325 (65 Fe) MG tablet Take 325 mg by mouth 2 (two) times daily. 11/18/20   [provider]  fluticasone (FLONASE) 50 MCG/ACT nasal spray Place 1 spray into both nostrils daily. 10/29/20   [provider]  FLUZONE HIGH-DOSE  QUADRIVALENT 0.7 ML SUSY  09/26/19   [provider]  glimepiride (AMARYL) 1 MG tablet TAKE 1/2 TABLET(0.5 MG) BY MOUTH TWICE DAILY 06/27/20   Jacelyn Pi, Lilia Argue, MD  levocetirizine (XYZAL) 5 MG tablet SMARTSIG:1 Tablet(s) By Mouth Every Evening 10/08/20   [provider]  meclizine (ANTIVERT) 25 MG tablet Take 1 tablet (25 mg total) by mouth 3 (three) times daily as needed for dizziness. 04/18/20   Wendall Mola, NP  montelukast (SINGULAIR) 10 MG tablet Take 10 mg by mouth daily. 12/16/20   [provider]  SENNA PLUS 8.6-50 MG tablet TAKE 2 TABLETS BY MOUTH 2 TIMES DAILY AS NEEDED FOR CONSTIPATION 10/29/20   [provider]  sertraline (ZOLOFT) 25 MG tablet Take 25 mg by mouth daily. 01/28/21   [provider]  triamcinolone ointment (KENALOG) 0.5 % SMARTSIG:Sparingly Topical Twice Daily 10/14/20   [provider]  vitamin B-12 (CYANOCOBALAMIN) 1000 MCG tablet Take 1,000 mcg by mouth daily. 11/18/20   [provider]  White Petrolatum-Mineral Oil (SYSTANE NIGHTTIME) OINT SMARTSIG:1 Inch(es) In Eye(s) Every Night 01/10/20   [provider]     Critical care time: 68 minutes     Redmond [employer]., MSN, APRN, AGACNP-BC [facility]  08/08/2021 , 11:06 PM  Please see [email or web address] for pager details  If no response, please call [phone] After hours, please call Elink at [phone]'

## 2021-08-08 NOTE — ED Notes (Signed)
Pharmacy notified about sodium rise.

## 2021-08-09 ENCOUNTER — Inpatient Hospital Stay (HOSPITAL_COMMUNITY): Payer: Medicare Other

## 2021-08-09 DIAGNOSIS — E871 Hypo-osmolality and hyponatremia: Secondary | ICD-10-CM | POA: Diagnosis not present

## 2021-08-09 LAB — CBC
HCT: 26.7 % — ABNORMAL LOW (ref 36.0–46.0)
Hemoglobin: 9.4 g/dL — ABNORMAL LOW (ref 12.0–15.0)
MCH: 31.1 pg (ref 26.0–34.0)
MCHC: 35.2 g/dL (ref 30.0–36.0)
MCV: 88.4 fL (ref 80.0–100.0)
Platelets: 240 10*3/uL (ref 150–400)
RBC: 3.02 MIL/uL — ABNORMAL LOW (ref 3.87–5.11)
RDW: 11.7 % (ref 11.5–15.5)
WBC: 8.1 10*3/uL (ref 4.0–10.5)
nRBC: 0 % (ref 0.0–0.2)

## 2021-08-09 LAB — BASIC METABOLIC PANEL
Anion gap: 13 (ref 5–15)
BUN: 22 mg/dL (ref 8–23)
CO2: 28 mmol/L (ref 22–32)
Calcium: 9.3 mg/dL (ref 8.9–10.3)
Chloride: 81 mmol/L — ABNORMAL LOW (ref 98–111)
Creatinine, Ser: 1.05 mg/dL — ABNORMAL HIGH (ref 0.44–1.00)
GFR, Estimated: 51 mL/min — ABNORMAL LOW (ref >60)
Glucose, Bld: 122 mg/dL — ABNORMAL HIGH (ref 70–99)
Potassium: 3 mmol/L — ABNORMAL LOW (ref 3.5–5.1)
Sodium: 122 mmol/L — ABNORMAL LOW (ref 135–145)

## 2021-08-09 LAB — URINALYSIS, ROUTINE W REFLEX MICROSCOPIC
Bilirubin Urine: NEGATIVE
Glucose, UA: NEGATIVE mg/dL
Ketones, ur: NEGATIVE mg/dL
Nitrite: NEGATIVE
Protein, ur: NEGATIVE mg/dL
Specific Gravity, Urine: 1.006 (ref 1.005–1.030)
pH: 7 (ref 5.0–8.0)

## 2021-08-09 LAB — GLUCOSE, CAPILLARY
Glucose-Capillary: 118 mg/dL — ABNORMAL HIGH (ref 70–99)
Glucose-Capillary: 135 mg/dL — ABNORMAL HIGH (ref 70–99)
Glucose-Capillary: 154 mg/dL — ABNORMAL HIGH (ref 70–99)
Glucose-Capillary: 112 mg/dL — ABNORMAL HIGH (ref 70–99)
Glucose-Capillary: 254 mg/dL — ABNORMAL HIGH (ref 70–99)
Glucose-Capillary: 98 mg/dL (ref 70–99)

## 2021-08-09 LAB — HEMOGLOBIN A1C
Hgb A1c MFr Bld: 5 % (ref 4.8–5.6)
Mean Plasma Glucose: 96.8 mg/dL

## 2021-08-09 LAB — PROTIME-INR
INR: 1 (ref 0.8–1.2)
Prothrombin Time: 12.7 seconds (ref 11.4–15.2)

## 2021-08-09 LAB — CK: Total CK: 117 U/L (ref 38–234)

## 2021-08-09 LAB — MAGNESIUM
Magnesium: 2 mg/dL (ref 1.7–2.4)
Magnesium: 2 mg/dL (ref 1.7–2.4)

## 2021-08-09 LAB — SODIUM
Sodium: 122 mmol/L — ABNORMAL LOW (ref 135–145)
Sodium: 123 mmol/L — ABNORMAL LOW (ref 135–145)
Sodium: 124 mmol/L — ABNORMAL LOW (ref 135–145)

## 2021-08-09 LAB — CORTISOL: Cortisol, Plasma: 23 ug/dL

## 2021-08-09 LAB — CBG MONITORING, ED: Glucose-Capillary: 111 mg/dL — ABNORMAL HIGH (ref 70–99)

## 2021-08-09 LAB — OSMOLALITY: Osmolality: 260 mOsm/kg — ABNORMAL LOW (ref 275–295)

## 2021-08-09 LAB — TSH: TSH: 0.629 u[IU]/mL (ref 0.350–4.500)

## 2021-08-09 LAB — PHOSPHORUS: Phosphorus: 3.6 mg/dL (ref 2.5–4.6)

## 2021-08-09 LAB — OSMOLALITY, URINE: Osmolality, Ur: 238 mOsm/kg — ABNORMAL LOW (ref 300–900)

## 2021-08-09 LAB — SODIUM, URINE, RANDOM: Sodium, Ur: 28 mmol/L

## 2021-08-09 MED ORDER — CHLORHEXIDINE GLUCONATE CLOTH 2 % EX PADS
6.0000 | MEDICATED_PAD | Freq: Every day | CUTANEOUS | Status: DC
  Administered 2021-08-09 – 2021-08-12 (×3): 6 via TOPICAL

## 2021-08-09 MED ORDER — MIDAZOLAM HCL 2 MG/2ML IJ SOLN: 0.5000 mg | INTRAMUSCULAR | Status: DC | PRN

## 2021-08-09 MED ORDER — SODIUM CHLORIDE 0.9 % IV SOLN
INTRAVENOUS | Status: DC | PRN
  Administered 2021-08-09: 1000 mL via INTRAVENOUS

## 2021-08-09 MED ORDER — CARVEDILOL 6.25 MG PO TABS
6.2500 mg | ORAL_TABLET | Freq: Two times a day (BID) | ORAL | Status: DC
  Administered 2021-08-09 – 2021-08-12 (×8): 6.25 mg via ORAL
  Filled 2021-08-09: qty 1
  Filled 2021-08-09 (×2): qty 2
  Filled 2021-08-09 (×2): qty 1
  Filled 2021-08-09 (×2): qty 2
  Filled 2021-08-09: qty 1

## 2021-08-09 MED ORDER — HYDRALAZINE HCL 20 MG/ML IJ SOLN
10.0000 mg | INTRAMUSCULAR | Status: DC | PRN
  Administered 2021-08-09: 10 mg via INTRAVENOUS
  Filled 2021-08-09: qty 1

## 2021-08-09 MED ORDER — SODIUM CHLORIDE 0.9% FLUSH: 10.0000 mL | INTRAVENOUS | Status: DC | PRN

## 2021-08-09 MED ORDER — CHLORHEXIDINE GLUCONATE 0.12 % MT SOLN
15.0000 mL | Freq: Two times a day (BID) | OROMUCOSAL | Status: DC
  Administered 2021-08-10 – 2021-08-12 (×5): 15 mL via OROMUCOSAL
  Filled 2021-08-09 (×4): qty 15

## 2021-08-09 MED ORDER — SODIUM CHLORIDE 0.9% FLUSH
10.0000 mL | Freq: Two times a day (BID) | INTRAVENOUS | Status: DC
  Administered 2021-08-09: 20 mL
  Administered 2021-08-10 – 2021-08-12 (×4): 10 mL

## 2021-08-09 NOTE — ED Notes (Signed)
ED TO INPATIENT HANDOFF REPORT  ED Nurse Name and Phone #:  [phone]  S Name/Age/Gender Robin Andrews 87 y.o. female Room/Bed: 009C/009C  Code Status   Code Status: Full Code  Home/SNF/Other Home confused Is this baseline? No   Triage Complete: Triage complete  Chief Complaint SDH (subdural hematoma) (HCC) [S06.5X9A]  Triage Note Pt bib [facility] from home c/o headache secondary to fall. Per ems, pt fell 2 days ago at home. Pt lives at home by herself, but has family/ neighbors checks on her regularly, today they noticed that pt's breathing was different so they called the ems. Redness &swelling noted on pt's posterior head. Not on blood thinner.  Pt also stated that " she"ll die if she lives by herself."   - no meds given by ems   Vitals with ems:   BP 143/73 Pulse 78  O2 96% RA      Allergies Allergies  Allergen Reactions  . Hydrochlorothiazide     Severe hyponatremia resulting in hospitalization  . Lisinopril     Level of Care/Admitting Diagnosis ED Disposition    ED Disposition  Admit   Condition  --   Comment  Hospital Area: [facility] [[id number]]  Level of Care: ICU [6]  May admit patient to [facility] or [facility] if equivalent level of care is available:: Yes  Covid Evaluation: Asymptomatic Screening Protocol (No Symptoms)  Diagnosis: SDH (subdural hematoma) [facility]) [[id number]]  Admitting Physician: Kirtland Bouchard [[id number]]  Attending Physician: Kirtland Bouchard [[id number]]  Estimated length of stay: past midnight tomorrow  Certification:: I certify this patient will need inpatient services for at least 2 midnights         B Medical/Surgery History Past Medical History:  Diagnosis Date  . Degenerative disc disease   . Diabetes mellitus without complication (HCC)   . Hypertension   . Pruritus    Past Surgical History:  Procedure Laterality Date  . ANTERIOR CERVICAL DECOMP/DISCECTOMY FUSION  02/2010   C3-6/notes  03/09/2010  . BACK SURGERY    . DILATION AND CURETTAGE, DIAGNOSTIC / THERAPEUTIC       A IV Location/Drains/Wounds Patient Lines/Drains/Airways Status    Active Line/Drains/Airways    Name Placement date Placement time Site Days   Peripheral IV 08/08/21 20 G Right Antecubital 08/08/21  --  Antecubital  1          Intake/Output Last 24 hours No intake or output data in the 24 hours ending 08/09/21 0033  Labs/Imaging Results for orders placed or performed during the hospital encounter of 08/08/21 (from the past 48 hour(s))  CBG monitoring, ED     Status: Abnormal   Collection Time: 08/08/21  7:09 PM  Result Value Ref Range   Glucose-Capillary 115 (H) 70 - 99 mg/dL    Comment: Glucose reference range applies only to samples taken after fasting for at least 8 hours.  Comprehensive metabolic panel     Status: Abnormal   Collection Time: 08/08/21  7:21 PM  Result Value Ref Range   Sodium 115 (LL) 135 - 145 mmol/L    Comment: CRITICAL RESULT CALLED TO, READ BACK BY AND VERIFIED WITHReine Just RN [id number] K FORSYTH    Potassium 3.1 (L) 3.5 - 5.1 mmol/L   Chloride 76 (L) 98 - 111 mmol/L   CO2 25 22 - 32 mmol/L   Glucose, Bld 112 (H) 70 - 99 mg/dL    Comment: Glucose reference range applies only to samples  taken after fasting for at least 8 hours.   BUN 26 (H) 8 - 23 mg/dL   Creatinine, Ser 0.981.20 (H) 0.44 - 1.00 mg/dL   Calcium 8.8 (L) 8.9 - 10.3 mg/dL   Total Protein 6.4 (L) 6.5 - 8.1 g/dL   Albumin 3.4 (L) 3.5 - 5.0 g/dL   AST 30 15 - 41 U/L   ALT 14 0 - 44 U/L   Alkaline Phosphatase 62 38 - 126 U/L   Total Bilirubin 0.6 0.3 - 1.2 mg/dL   GFR, Estimated 44 (L) >60 mL/min    Comment: (NOTE) Calculated using the CKD-EPI Creatinine Equation (2021)    Anion gap 14 5 - 15    Comment: Performed at [facility], [address] [place], [place] [address]  Troponin I (High Sensitivity)     Status: Abnormal   Collection Time: 08/08/21  7:21 PM  Result Value Ref Range    Troponin I (High Sensitivity) 38 (H) <18 ng/L    Comment: (NOTE) Elevated high sensitivity troponin I (hsTnI) values and significant  changes across serial measurements may suggest ACS but many other  chronic and acute conditions are known to elevate hsTnI results.  Refer to the Links section for chest pain algorithms and additional  guidance. Performed at [facility], [address] [place], [place] [address]   CBC with Differential     Status: Abnormal   Collection Time: 08/08/21  7:21 PM  Result Value Ref Range   WBC 7.9 4.0 - 10.5 K/uL   RBC 2.97 (L) 3.87 - 5.11 MIL/uL   Hemoglobin 9.2 (L) 12.0 - 15.0 g/dL   HCT 62.126.6 (L) 30.836.0 - 65.746.0 %   MCV 89.6 80.0 - 100.0 fL   MCH 31.0 26.0 - 34.0 pg   MCHC 34.6 30.0 - 36.0 g/dL   RDW 84.611.9 96.211.5 - 95.215.5 %   Platelets 242 150 - 400 K/uL   nRBC 0.0 0.0 - 0.2 %   Neutrophils Relative % 69 %   Neutro Abs 5.6 1.7 - 7.7 K/uL   Lymphocytes Relative 13 %   Lymphs Abs 1.1 0.7 - 4.0 K/uL   Monocytes Relative 11 %   Monocytes Absolute 0.8 0.1 - 1.0 K/uL   Eosinophils Relative 5 %   Eosinophils Absolute 0.4 0.0 - 0.5 K/uL   Basophils Relative 1 %   Basophils Absolute 0.1 0.0 - 0.1 K/uL   Immature Granulocytes 1 %   Abs Immature Granulocytes 0.06 0.00 - 0.07 K/uL    Comment: Performed at [facility], [address] [place], [place] [address]  Resp Panel by RT-PCR (Flu A&B, Covid) Nasopharyngeal Swab     Status: None   Collection Time: 08/08/21  7:22 PM   Specimen: Nasopharyngeal Swab; Nasopharyngeal(NP) swabs in vial transport medium  Result Value Ref Range   SARS Coronavirus 2 by RT PCR NEGATIVE NEGATIVE    Comment: (NOTE) SARS-CoV-2 target nucleic acids are NOT DETECTED.  The SARS-CoV-2 RNA is generally detectable in upper respiratory specimens during the acute phase of infection. The lowest concentration of SARS-CoV-2 viral copies this assay can detect is 138 copies/mL. A negative result does not preclude  SARS-Cov-2 infection and should not be used as the sole basis for treatment or other patient management decisions. A negative result may occur with  improper specimen collection/handling, submission of specimen other than nasopharyngeal swab, presence of viral mutation(s) within the areas targeted by this assay, and inadequate number of viral  copies(<138 copies/mL). A negative result must be combined with clinical observations, patient history, and epidemiological information. The expected result is Negative.  Fact Sheet for Patients:  BloggerCourse.com  Fact Sheet for Healthcare Providers:  SeriousBroker.it  This test is no t yet approved or cleared by the [place] FDA and  has been authorized for detection and/or diagnosis of SARS-CoV-2 by FDA under an Emergency Use Authorization (EUA). This EUA will remain  in effect (meaning this test can be used) for the duration of the COVID-19 declaration under Section 564(b)(1) of the Act, 21 U.S.C.section 360bbb-3(b)(1), unless the authorization is terminated  or revoked sooner.       Influenza A by PCR NEGATIVE NEGATIVE   Influenza B by PCR NEGATIVE NEGATIVE    Comment: (NOTE) The Xpert Xpress SARS-CoV-2/FLU/RSV plus assay is intended as an aid in the diagnosis of influenza from Nasopharyngeal swab specimens and should not be used as a sole basis for treatment. Nasal washings and aspirates are unacceptable for Xpert Xpress SARS-CoV-2/FLU/RSV testing.  Fact Sheet for Patients: BloggerCourse.com  Fact Sheet for Healthcare Providers: SeriousBroker.it  This test is not yet approved or cleared by the [place] FDA and has been authorized for detection and/or diagnosis of SARS-CoV-2 by FDA under an Emergency Use Authorization (EUA). This EUA will remain in effect (meaning this test can be used) for the duration of the COVID-19  declaration under Section 564(b)(1) of the Act, 21 U.S.C. section 360bbb-3(b)(1), unless the authorization is terminated or revoked.  Performed at [facility], [address], [place], [place] [address]   Sodium     Status: Abnormal   Collection Time: 08/08/21  9:20 PM  Result Value Ref Range   Sodium 120 (L) 135 - 145 mmol/L    Comment: Performed at [facility], [address], [place], [place] [address]  Troponin I (High Sensitivity)     Status: Abnormal   Collection Time: 08/08/21  9:21 PM  Result Value Ref Range   Troponin I (High Sensitivity) 40 (H) <18 ng/L    Comment: (NOTE) Elevated high sensitivity troponin I (hsTnI) values and significant  changes across serial measurements may suggest ACS but many other  chronic and acute conditions are known to elevate hsTnI results.  Refer to the "Links" section for chest pain algorithms and additional  guidance. Performed at [facility], [address] [place], [place] [address]   Hemoglobin A1c     Status: None   Collection Time: 08/08/21 10:50 PM  Result Value Ref Range   Hgb A1c MFr Bld 5.0 4.8 - 5.6 %    Comment: (NOTE) Pre diabetes:          5.7%-6.4%  Diabetes:              >6.4%  Glycemic control for   <7.0% adults with diabetes    Mean Plasma Glucose 96.8 mg/dL    Comment: Performed at [facility], [address] [place], [place] [address]  CBC     Status: Abnormal   Collection Time: 08/08/21 11:54 PM  Result Value Ref Range   WBC 8.1 4.0 - 10.5 K/uL   RBC 3.02 (L) 3.87 - 5.11 MIL/uL   Hemoglobin 9.4 (L) 12.0 - 15.0 g/dL   HCT 53.6 (L) 64.4 - 03.4 %   MCV 88.4 80.0 - 100.0 fL   MCH 31.1 26.0 - 34.0 pg   MCHC 35.2 30.0 - 36.0 g/dL   RDW 11.7  11.5 - 15.5 %   Platelets 240 150 - 400 K/uL   nRBC 0.0 0.0 - 0.2 %    Comment: Performed at [facility], [address] [place], [place] [address]  CBG monitoring, ED     Status: Abnormal   Collection Time: 08/09/21 12:07 AM  Result  Value Ref Range   Glucose-Capillary 111 (H) 70 - 99 mg/dL    Comment: Glucose reference range applies only to samples taken after fasting for at least 8 hours.   DG Chest 1 View  Result Date: 08/08/2021 CLINICAL DATA:  Fall EXAM: CHEST  1 VIEW COMPARISON:  10/13/2018 FINDINGS: Patchy atelectasis or minimal infiltrate at lingula. No pleural effusion. Normal cardiac size. Aortic atherosclerosis. No pneumothorax IMPRESSION: Patchy atelectasis or minimal infiltrate at the lingula. Electronically Signed   By: Jasmine Pang M.D.   On: 08/08/2021 20:19   CT Head Wo Contrast  Result Date: 08/08/2021 CLINICAL DATA:  Head neck trauma EXAM: CT HEAD WITHOUT CONTRAST CT CERVICAL SPINE WITHOUT CONTRAST TECHNIQUE: Multidetector CT imaging of the head and cervical spine was performed following the standard protocol without intravenous contrast. Multiplanar CT image reconstructions of the cervical spine were also generated. COMPARISON:  CT brain 01/29/2021, CT brain and cervical spine 02/05/2017 FINDINGS: CT HEAD FINDINGS Brain: No acute territorial infarction or intracranial mass is visualized. Trace 3 mm left posterior parafalcine subdural hematoma, series 2, image 18, without mass effect or midline shift. Atrophy and chronic small vessel ischemic changes of the white matter. Stable ventricle size. Vascular: No hyperdense vessel.  Carotid vascular calcification Skull: No depressed skull fracture Sinuses/Orbits: No acute finding. Other: Large right parietooccipital scalp hematoma CT CERVICAL SPINE FINDINGS Alignment: Straightening of the cervical spine. No subluxation. Facet alignment is maintained Skull base and vertebrae: No acute fracture. No primary bone lesion or focal pathologic process. Soft tissues and spinal canal: No prevertebral fluid or swelling. No visible canal hematoma. Disc levels: Post fusion changes C3 through C6 with anterior plate and fixating screws. Solid bone fusion is present. Moderate degenerative  change at C6-C7. Facet degenerative changes at multiple levels with foraminal narrowing. Upper chest: Lung apices clear. 2.5 cm hypodense nodule in the right lobe of thyroid. In the setting of significant comorbidities or limited life expectancy, no follow-up recommended (ref: J Am Coll Radiol. 2015 Feb;12(2): 143-50). Other: None IMPRESSION: 1. Acute 3 mm left posterior parafalcine subdural hematoma without significant mass effect or midline shift. Atrophy and chronic small vessel ischemic changes of the white matter. 2. Large right parietooccipital scalp hematoma 3. Post fusion changes of cervical spine C3 through C6. No acute osseous abnormality Critical Value/emergent results were called by telephone at the time of interpretation on 08/08/2021 at 8:36 pm to provider [facility] , who verbally acknowledged these results. Electronically Signed   By: Jasmine Pang M.D.   On: 08/08/2021 20:36   CT Cervical Spine Wo Contrast  Result Date: 08/08/2021 CLINICAL DATA:  Head neck trauma EXAM: CT HEAD WITHOUT CONTRAST CT CERVICAL SPINE WITHOUT CONTRAST TECHNIQUE: Multidetector CT imaging of the head and cervical spine was performed following the standard protocol without intravenous contrast. Multiplanar CT image reconstructions of the cervical spine were also generated. COMPARISON:  CT brain 01/29/2021, CT brain and cervical spine 02/05/2017 FINDINGS: CT HEAD FINDINGS Brain: No acute territorial infarction or intracranial mass is visualized. Trace 3 mm left posterior parafalcine subdural hematoma, series 2, image 18, without mass effect or midline shift. Atrophy and chronic small vessel ischemic changes  of the white matter. Stable ventricle size. Vascular: No hyperdense vessel.  Carotid vascular calcification Skull: No depressed skull fracture Sinuses/Orbits: No acute finding. Other: Large right parietooccipital scalp hematoma CT CERVICAL SPINE FINDINGS Alignment: Straightening of the cervical spine. No subluxation.  Facet alignment is maintained Skull base and vertebrae: No acute fracture. No primary bone lesion or focal pathologic process. Soft tissues and spinal canal: No prevertebral fluid or swelling. No visible canal hematoma. Disc levels: Post fusion changes C3 through C6 with anterior plate and fixating screws. Solid bone fusion is present. Moderate degenerative change at C6-C7. Facet degenerative changes at multiple levels with foraminal narrowing. Upper chest: Lung apices clear. 2.5 cm hypodense nodule in the right lobe of thyroid. In the setting of significant comorbidities or limited life expectancy, no follow-up recommended (ref: J Am Coll Radiol. 2015 Feb;12(2): 143-50). Other: None IMPRESSION: 1. Acute 3 mm left posterior parafalcine subdural hematoma without significant mass effect or midline shift. Atrophy and chronic small vessel ischemic changes of the white matter. 2. Large right parietooccipital scalp hematoma 3. Post fusion changes of cervical spine C3 through C6. No acute osseous abnormality Critical Value/emergent results were called by telephone at the time of interpretation on 08/08/2021 at 8:36 pm to provider [facility] , who verbally acknowledged these results. Electronically Signed   By: Jasmine Pang M.D.   On: 08/08/2021 20:36   CT Hip Right Wo Contrast  Result Date: 08/08/2021 CLINICAL DATA:  Fall EXAM: CT OF THE RIGHT HIP WITHOUT CONTRAST TECHNIQUE: Multidetector CT imaging of the right hip was performed according to the standard protocol. Multiplanar CT image reconstructions were also generated. COMPARISON:  None. FINDINGS: Bones/Joint/Cartilage No fracture of the right hip. Ligaments Suboptimally assessed by CT. Muscles and Tendons Unremarkable Soft tissues Atherosclerotic calcification in the right femoral artery. Soft tissues otherwise unremarkable. IMPRESSION: No fracture of the right hip. Electronically Signed   By: Deatra Robinson M.D.   On: 08/08/2021 23:10   DG HIP UNILAT WITH PELVIS  2-3 VIEWS LEFT  Result Date: 08/08/2021 CLINICAL DATA:  Fall EXAM: DG HIP (WITH OR WITHOUT PELVIS) 2-3V LEFT COMPARISON:  None. FINDINGS: Left pubic rami appear intact. No definitive fracture or malalignment. IMPRESSION: No definite acute osseous abnormality. Electronically Signed   By: Jasmine Pang M.D.   On: 08/08/2021 20:20   DG HIP UNILAT WITH PELVIS 2-3 VIEWS RIGHT  Result Date: 08/08/2021 CLINICAL DATA:  Fall EXAM: DG HIP (WITH OR WITHOUT PELVIS) 2-3V RIGHT COMPARISON:  None. FINDINGS: SI joints are non widened. Pubic symphysis and rami appear intact. Limited evaluation of femoral neck, question step-off deformity at the base of the femoral neck IMPRESSION: Questionable step-off deformity/fracture at base of femoral neck. Suggest CT for further evaluation. Electronically Signed   By: Jasmine Pang M.D.   On: 08/08/2021 20:22    Pending Labs Unresulted Labs (From admission, onward)    Start     Ordered   08/09/21 0500  Basic metabolic panel  Tomorrow morning,   R        08/08/21 2231   08/09/21 0500  Magnesium  Tomorrow morning,   R        08/08/21 2231   08/09/21 0500  Phosphorus  Tomorrow morning,   R        08/08/21 2231   08/09/21 0255  Sodium  Every 4 hours,   R (with STAT occurrences)      08/08/21 2056   08/08/21 2237  Protime-INR  Once,   STAT  08/08/21 2236   08/08/21 2233  Magnesium  Add-on,   AD        08/08/21 2232   08/08/21 2233  TSH  Once,   STAT        08/08/21 2234   08/08/21 2233  CK  Once,   STAT        08/08/21 2234   08/08/21 2233  Cortisol, Random  Once,   STAT        08/08/21 2234   08/08/21 2055  Sodium  Now then every 2 hours,   R (with STAT occurrences)      08/08/21 2056   08/08/21 2055  Osmolality  Once,   STAT        08/08/21 2056   08/08/21 2055  Osmolality, urine  Once,   STAT        08/08/21 2056   08/08/21 2055  Sodium, urine, random  Once,   STAT        08/08/21 2056   08/08/21 1921  Urinalysis, Routine w reflex microscopic Urine,  Clean Catch  Once,   STAT        08/08/21 1921          Vitals/Pain Today's Vitals   08/08/21 2230 08/08/21 2300 08/08/21 2315 08/08/21 2330  BP: 138/63 (!) 151/60 135/67 (!) 157/80  Pulse: 74 86 71 81  Resp: 15 15 18  (!) 21  Temp:      TempSrc:      SpO2: 98% 99% 100% 100%    Isolation Precautions No active isolations  Medications Medications  docusate sodium (COLACE) capsule 100 mg (has no administration in time range)  polyethylene glycol (MIRALAX / GLYCOLAX) packet 17 g (has no administration in time range)  pantoprazole (PROTONIX) EC tablet 40 mg (has no administration in time range)  insulin aspart (novoLOG) injection 0-9 Units (0 Units Subcutaneous Not Given 08/09/21 0008)  acetaminophen (TYLENOL) tablet 650 mg (has no administration in time range)  potassium chloride 10 mEq in 100 mL IVPB (10 mEq Intravenous New Bag/Given 08/09/21 0004)    Mobility      Focused Assessments    R Recommendations: See Admitting Provider Note  Report given to:   Additional Notes:

## 2021-08-09 NOTE — Progress Notes (Signed)
eLink Physician-Brief Progress Note Patient Name: Robin Andrews DOB: July 15, 1931 MRN: [id number]   Date of Service  08/09/2021  HPI/Events of Note  Hypertension - BP = 173/74   eICU Interventions  Plan: Hydralazine 10 mg IV Q 4 hours PRN SBP > 170 or DBP > 100.     Intervention Category Major Interventions: Hypertension - evaluation and management  Sharay Bellissimo Dennard Nip 08/09/2021, 4:39 AM

## 2021-08-09 NOTE — TOC CAGE-AID Note (Signed)
Transition of Care [facility]) - CAGE-AID Screening   Patient Details  Name: Robin Andrews MRN: [id number] Date of Birth: Sep 18, 1931   Hewitt Shorts, RN Trauma Response Nurse Phone Number: [phone] 08/09/2021, 1:34 PM    CAGE-AID Screening: Substance Abuse Screening unable to be completed due to: : Patient unable to participate (pt is confused/repetitive questioning)

## 2021-08-09 NOTE — Progress Notes (Signed)
0400- Pt refused lab draw, plan to retry later.  0500 - Spoke with Dr. Arsenio Loader about q4 sodium levels; phlebotomy called to bedside; pt refused lab draw.  0600 - retry to draw sodium levels; pt refused lab draw

## 2021-08-09 NOTE — Progress Notes (Addendum)
NAME:  Robin Andrews, MRN:  [id number], DOB:  07/24/1931, LOS: 1 ADMISSION DATE:  08/08/2021, CONSULTATION DATE:   REFERRING MD:  Delford Field MD, CHIEF COMPLAINT:  Fall   History of Present Illness:   Robin Andrews ,is a 87 y.o. female, who presented to the [facility] ED with a chief complaint of fall  They have a pertinent past medical history of HTN, DM2, and CKD.  A translator was used [phone]) for the HPI and exam. The patient was confused and repetitive at times during exam limiting accuracy. No family present at bedside.  History was obtained from exam and chart review.  Robin Andrews reported falling some time between 1-2 days ago while walking to the bathroom. She called EMS the evening of 8/19. At the [facility] ED she was found to be hyponatremic (NA 115) a parafalcine SDH was seen on [facility] for which neurosurgery was consulted. She was also found to have a scalp hematoma and a questionable right femoral neck fracture. She was a GCS of 15 on exam but had periods of confusion and repetitiveness upon exam  PCCM was consulted for admission.    Pertinent  Medical History  HTN, DM, CKDIII  Significant Hospital Events: Including procedures, antibiotic start and stop dates in addition to other pertinent events   8/19 Presented, NA 115, Small SDH on CT, NSGY consulted, Admit to ICU 8/20 refusing lab draws, appears decisional  Interim History / Subjective:  Has headache today. Reports some right hip pain. No nausea, vomiting.   Objective   Blood pressure (!) 137/55, pulse 92, temperature 99 F (37.2 C), temperature source Oral, resp. rate (!) 31, height 5\' 3"  (1.6 m), weight 39.7 kg, SpO2 96 %. On RA        Intake/Output Summary (Last 24 hours) at 08/09/2021 0934 Last data filed at 08/09/2021 0700 Gross per 24 hour  Intake 597.97 ml  Output --  Net 597.97 ml   Filed Weights   08/09/21 0300  Weight: 39.7 kg    Examination: General appearance: 87 y.o., female, NAD, conversant  Eyes: anicteric  sclerae, moist conjunctivae; no lid-lag; PERRL, tracking appropriately HENT: r scalp hematoma, dry MM Neck: Trachea midline; no lymphadenopathy, no JVD Lungs: CTAB, no crackles, no wheeze, with normal respiratory effort CV: RRR, no MRGs  Abdomen: Soft, somewhat ttp; non-distended, BS present  Extremities: thin, No peripheral edema, radial and DP pulses present bilaterally  Skin: Normal temperature, turgor and texture; no rash Psych: Appropriate affect Neuro: Alert and oriented to person and place, no focal deficit    Resolved Hospital Problem list     Assessment & Plan:   Hyponatremia Initial NA 115, goal 121-123 by 7pm today - corrected with one time dose of hypertonic saline but this doesn't show up in medication history. Likely multifactorial hyponatremia - appears hypovolemic on exam (IVC appears pretty flat) and poor reported PO intake (low solute intake) apart from liquids, TBI could be contributory, and reports that she resumed zoloft 4 days ago. Labs with moderately concentrated urine, low-moderate Na that doesn't neatly fit any box in particular although they were obtained after getting hypertonic saline. -q4h sodium checks. Goal NA in initial 24 hours 121-123. Monitor. She is refusing frequent phlebotomy and appears to have decisional capacity at present. Will attempt midline to facilitate blood draws at least for today.  -has received potassium supplementation which may drive Na up as well. Await next sodium check to guide therapeutic approach -f/u cortisol -can liberalize diet  Parafalcine SDH- seen on [facility] 8/19, secondary to fall with head trauma ?Acute metabolic encephalopathy- secondary to hyponatremia vs SDH vs TBI Patient reportedly on on blood thinners although she denies this today. No fever on admission. No WBC elevation. Seems to be improving. -NSGY consulted. No surgical intervantion warrented at this time per note -AM head CT scan stable -Seizure PPX per  NSGY -Hyponatremia interventions as discussed -Neuro watch  Hypokalemia -Correct electrolytes, Goal K above 4, goal MG above 2  Mechanical fall- per patient fell while walking to bathroom. Unclear downtime. Patient stated two days initially but later said one day - denies LOC. -Obtain CT of hip per recs -Ortho consult in AM after CT obtained -Check CK -PT/OT when appropriate -Tylenol for pain  Scalp hematoma Secondary to fall - stable on CTH P -Monitor  Anemia, normocytic HGB 9.2, Unclear if secondary to trauma or chronic. -follow up H/H -transfuse for Hgb <7%, active bleeding -Monitor for bleeding  Troponin elevation Suspect secondary to stress. Denies chest pain. -Monitor clinical exam  HX HTN Goal SBP less than 160 - start coreg 6.25 BID  HX CKD stage III -Avoid neprotoxic drugs as possible. -Strict I&O's -CTM  HX DM2 -SSI  Best Practice (right click and "Reselect all SmartList Selections" daily)   Diet/type: NPO DVT prophylaxis: SCD GI prophylaxis: H2B Lines: N/A Foley:  N/A Code Status:  full code - she seems to indicate that she is DNR with reasonably good insight. I will discuss with her grandchildren today. Last date of multidisciplinary goals of care discussion [pending]  Critical care time: 36 minutes    This patient is critically ill with severe hyponatremia, SDH; which, requires frequent high complexity decision making, assessment, support, evaluation, and titration of therapies. This was completed through the application of advanced monitoring technologies and extensive interpretation of multiple databases. During this encounter critical care time was devoted to patient care services described in this note for 36 minutes.   [facility] Pulmonary/Critical Care   Please see [email or web address] for pager details  If no response, please call [phone] After hours, please call Elink at [phone]'

## 2021-08-10 DIAGNOSIS — N182 Chronic kidney disease, stage 2 (mild): Secondary | ICD-10-CM

## 2021-08-10 DIAGNOSIS — E119 Type 2 diabetes mellitus without complications: Secondary | ICD-10-CM | POA: Diagnosis not present

## 2021-08-10 DIAGNOSIS — E876 Hypokalemia: Secondary | ICD-10-CM | POA: Diagnosis not present

## 2021-08-10 DIAGNOSIS — S065X9A Traumatic subdural hemorrhage with loss of consciousness of unspecified duration, initial encounter: Secondary | ICD-10-CM | POA: Diagnosis not present

## 2021-08-10 LAB — GLUCOSE, CAPILLARY
Glucose-Capillary: 107 mg/dL — ABNORMAL HIGH (ref 70–99)
Glucose-Capillary: 104 mg/dL — ABNORMAL HIGH (ref 70–99)
Glucose-Capillary: 151 mg/dL — ABNORMAL HIGH (ref 70–99)
Glucose-Capillary: 121 mg/dL — ABNORMAL HIGH (ref 70–99)
Glucose-Capillary: 127 mg/dL — ABNORMAL HIGH (ref 70–99)

## 2021-08-10 LAB — BASIC METABOLIC PANEL
Anion gap: 9 (ref 5–15)
Anion gap: 12 (ref 5–15)
BUN: 22 mg/dL (ref 8–23)
BUN: 20 mg/dL (ref 8–23)
CO2: 29 mmol/L (ref 22–32)
CO2: 23 mmol/L (ref 22–32)
Calcium: 8.4 mg/dL — ABNORMAL LOW (ref 8.9–10.3)
Calcium: 8.3 mg/dL — ABNORMAL LOW (ref 8.9–10.3)
Chloride: 84 mmol/L — ABNORMAL LOW (ref 98–111)
Chloride: 92 mmol/L — ABNORMAL LOW (ref 98–111)
Creatinine, Ser: 0.97 mg/dL (ref 0.44–1.00)
Creatinine, Ser: 1.01 mg/dL — ABNORMAL HIGH (ref 0.44–1.00)
GFR, Estimated: 56 mL/min — ABNORMAL LOW (ref >60)
GFR, Estimated: 54 mL/min — ABNORMAL LOW (ref >60)
Glucose, Bld: 108 mg/dL — ABNORMAL HIGH (ref 70–99)
Glucose, Bld: 81 mg/dL (ref 70–99)
Potassium: 3.8 mmol/L (ref 3.5–5.1)
Potassium: 4.1 mmol/L (ref 3.5–5.1)
Sodium: 122 mmol/L — ABNORMAL LOW (ref 135–145)
Sodium: 127 mmol/L — ABNORMAL LOW (ref 135–145)

## 2021-08-10 LAB — CBC
HCT: 26.5 % — ABNORMAL LOW (ref 36.0–46.0)
Hemoglobin: 9.1 g/dL — ABNORMAL LOW (ref 12.0–15.0)
MCH: 29.9 pg (ref 26.0–34.0)
MCHC: 34.3 g/dL (ref 30.0–36.0)
MCV: 87.2 fL (ref 80.0–100.0)
Platelets: 240 10*3/uL (ref 150–400)
RBC: 3.04 MIL/uL — ABNORMAL LOW (ref 3.87–5.11)
RDW: 11.7 % (ref 11.5–15.5)
WBC: 7.3 10*3/uL (ref 4.0–10.5)
nRBC: 0 % (ref 0.0–0.2)

## 2021-08-10 LAB — SODIUM
Sodium: 124 mmol/L — ABNORMAL LOW (ref 135–145)
Sodium: 125 mmol/L — ABNORMAL LOW (ref 135–145)
Sodium: 125 mmol/L — ABNORMAL LOW (ref 135–145)

## 2021-08-10 LAB — CREATININE, URINE, RANDOM: Creatinine, Urine: 70.14 mg/dL

## 2021-08-10 LAB — OSMOLALITY, URINE: Osmolality, Ur: 369 mOsm/kg (ref 300–900)

## 2021-08-10 LAB — SODIUM, URINE, RANDOM: Sodium, Ur: 23 mmol/L

## 2021-08-10 LAB — MRSA NEXT GEN BY PCR, NASAL: MRSA by PCR Next Gen: NOT DETECTED

## 2021-08-10 MED ORDER — ALUM & MAG HYDROXIDE-SIMETH 200-200-20 MG/5ML PO SUSP: 15.0000 mL | ORAL | Status: DC | PRN

## 2021-08-10 MED ORDER — SODIUM CHLORIDE 0.9 % IV SOLN: INTRAVENOUS | Status: DC

## 2021-08-10 NOTE — Progress Notes (Signed)
PROGRESS NOTE    ANTANIYA VENUTI  BOF:[id number] DOB: [birth year] DOA: 08/08/2021 PCP: Pearson Grippe, MD   Brief Narrative: LUCA BURSTON is a 87 y.o. female with a history of diabetes mellitus, hypertension, CKD. Patient presented secondary to fall and was found to have a subdural hemorrhage in addition to severe hyponatremia. She required ICU admission and initiation of hypertonic saline. Neurosurgery was consulted for her SDH and recommended no surgical management; repeat CT head was stable 48 hours later. Hyponatremia improved with hypertonic saline.   Assessment & Plan:   Active Problems:   Hypokalemia   SDH (subdural hematoma) (HCC)  Hyponatremia Patient presented with a sodium of 115. Etiology is likely related to poor oral intake, but also in setting of TBI in addition to SSRI usage. Patient was initially managed with hypertonic IV fluids followed by observation with appropriate improvement of sodium to 125. Cortisol of 23. -Continue to check BMP/sodium; if rate of improvement has decreased, will start normal saline IV fluids -Continue Regular diet  Parafalcine subdural hemorrhage Identified on CT obtained on 8/19. Secondary to fall and subsequent head trauma. Neurosurgery was consulted and recommended no surgical intervention. Repeat CT head on 8/20 significant for stable SDH.  Acute metabolic encephalopathy Thought to be secondary to hyponatremia vs SDH vs TBI. Possible patient has had a concussion. Unsure of actual baseline and unable to reach family to verify.  Scalp hematoma Secondary to fall.  Anemia Chronic from February. Stable.  Elevated troponin Troponin of 38 with a delta of 40. No chest pain. Per chart review, thought to be secondary to demand ischemia.  Primary hypertension Patient is on amlodipine as an outpatient although it is reported as not taking  CKD stage II Patient does not meet criteria for CKD stage III.  Hypokalemia Given supplementation. -BMP  today  Diabetes mellitus, type 2 Patient is on glimepiride as an outpatient although reported as not taking. Hemoglobin A1C of 5% -Continue SSI while inpatient; likely discontinue glimepiride on discharge  Fall Secondary to falling during ambulation. Reported no LOC. Head trauma as mentioned above. -PT/OT   DVT prophylaxis: SCDs Code Status:   Code Status: DNR Family Communication: Called multiple family members with no response Disposition Plan: Discharge likely to SNF pending PT/OT recommendations   Consultants:  Neurosurgery PCCM  Procedures:  None  Antimicrobials: None    Subjective:  [place] Interpreter: [place] [phone]  Patient with stuttering and was incoherent per interpreter services. Patient did want her shows up on the bed, for which she was grateful when performed. Per nursing, patient was complaining of reflux symptoms.   Objective: Vitals:   08/10/21 0805 08/10/21 0900 08/10/21 1000 08/10/21 1100  BP: (!) 157/100 (!) 147/69 124/64 103/80  Pulse: 70 65 64 77  Resp: (!) 24 14 15  (!) 26  Temp: 98 F (36.7 C)     TempSrc: Oral     SpO2: 99% 96% 97% 95%  Weight:      Height:       No intake or output data in the 24 hours ending 08/10/21 1136 Filed Weights   08/09/21 0300 08/10/21 0500  Weight: 39.7 kg 39.9 kg    Examination:  General exam: Appears calm and comfortable Respiratory system: Clear to auscultation. Respiratory effort normal. Cardiovascular system: S1 & S2 heard, RRR. No murmurs, rubs, gallops or clicks. Gastrointestinal system: Abdomen is nondistended, soft and nontender. No organomegaly or masses felt. Normal bowel sounds heard. Central nervous system: Alert. Follows commands. Musculoskeletal: No  edema. No calf tenderness Skin: No cyanosis. No rashes Psychiatry: Judgement and insight appear impaired.   Data Reviewed: I have personally reviewed following labs and imaging studies  CBC Lab Results  Component Value Date   WBC 7.3  08/10/2021   RBC 3.04 (L) 08/10/2021   HGB 9.1 (L) 08/10/2021   HCT 26.5 (L) 08/10/2021   MCV 87.2 08/10/2021   MCH 29.9 08/10/2021   PLT 240 08/10/2021   MCHC 34.3 08/10/2021   RDW 11.7 08/10/2021   LYMPHSABS 1.1 08/08/2021   MONOABS 0.8 08/08/2021   EOSABS 0.4 08/08/2021   BASOSABS 0.1 08/08/2021     Last metabolic panel Lab Results  Component Value Date   NA 124 (L) 08/10/2021   K 3.0 (L) 08/08/2021   CL 81 (L) 08/08/2021   CO2 28 08/08/2021   BUN 22 08/08/2021   CREATININE 1.05 (H) 08/08/2021   GLUCOSE 122 (H) 08/08/2021   GFRNONAA 51 (L) 08/08/2021   GFRAA 68 06/27/2020   CALCIUM 9.3 08/08/2021   PHOS 3.6 08/08/2021   PROT 6.4 (L) 08/08/2021   ALBUMIN 3.4 (L) 08/08/2021   LABGLOB 2.6 06/27/2020   AGRATIO 1.8 06/27/2020   BILITOT 0.6 08/08/2021   ALKPHOS 62 08/08/2021   AST 30 08/08/2021   ALT 14 08/08/2021   ANIONGAP 13 08/08/2021    CBG (last 3)  Recent Labs    08/09/21 2313 08/10/21 0408 08/10/21 0817  GLUCAP 98 107* 104*     GFR: Estimated Creatinine Clearance: 23.3 mL/min (A) (by C-G formula based on SCr of 1.05 mg/dL (H)).  Coagulation Profile: Recent Labs  Lab 08/08/21 2233  INR 1.0    Recent Results (from the past 240 hour(s))  Resp Panel by RT-PCR (Flu A&B, Covid) Nasopharyngeal Swab     Status: None   Collection Time: 08/08/21  7:22 PM   Specimen: Nasopharyngeal Swab; Nasopharyngeal(NP) swabs in vial transport medium  Result Value Ref Range Status   SARS Coronavirus 2 by RT PCR NEGATIVE NEGATIVE Final    Comment: (NOTE) SARS-CoV-2 target nucleic acids are NOT DETECTED.  The SARS-CoV-2 RNA is generally detectable in upper respiratory specimens during the acute phase of infection. The lowest concentration of SARS-CoV-2 viral copies this assay can detect is 138 copies/mL. A negative result does not preclude SARS-Cov-2 infection and should not be used as the sole basis for treatment or other patient management decisions. A negative  result may occur with  improper specimen collection/handling, submission of specimen other than nasopharyngeal swab, presence of viral mutation(s) within the areas targeted by this assay, and inadequate number of viral copies(<138 copies/mL). A negative result must be combined with clinical observations, patient history, and epidemiological information. The expected result is Negative.  Fact Sheet for Patients:  BloggerCourse.comhttps://www.fda.gov/media/152166/download  Fact Sheet for Healthcare Providers:  SeriousBroker.ithttps://www.fda.gov/media/152162/download  This test is no t yet approved or cleared by the [place] FDA and  has been authorized for detection and/or diagnosis of SARS-CoV-2 by FDA under an Emergency Use Authorization (EUA). This EUA will remain  in effect (meaning this test can be used) for the duration of the COVID-19 declaration under Section 564(b)(1) of the Act, 21 U.S.C.section 360bbb-3(b)(1), unless the authorization is terminated  or revoked sooner.       Influenza A by PCR NEGATIVE NEGATIVE Final   Influenza B by PCR NEGATIVE NEGATIVE Final    Comment: (NOTE) The Xpert Xpress SARS-CoV-2/FLU/RSV plus assay is intended as an aid in the diagnosis of influenza from Nasopharyngeal swab specimens  and should not be used as a sole basis for treatment. Nasal washings and aspirates are unacceptable for Xpert Xpress SARS-CoV-2/FLU/RSV testing.  Fact Sheet for Patients: BloggerCourse.com  Fact Sheet for Healthcare Providers: SeriousBroker.it  This test is not yet approved or cleared by the [place] FDA and has been authorized for detection and/or diagnosis of SARS-CoV-2 by FDA under an Emergency Use Authorization (EUA). This EUA will remain in effect (meaning this test can be used) for the duration of the COVID-19 declaration under Section 564(b)(1) of the Act, 21 U.S.C. section 360bbb-3(b)(1), unless the authorization is  terminated or revoked.  Performed at [facility], [address], [place], [place] [address]   MRSA Next Gen by PCR, Nasal     Status: None   Collection Time: 08/09/21  4:19 AM   Specimen: Nasal Mucosa; Nasal Swab  Result Value Ref Range Status   MRSA by PCR Next Gen NOT DETECTED NOT DETECTED Final    Comment: (NOTE) The GeneXpert MRSA Assay (FDA approved for NASAL specimens only), is one component of a comprehensive MRSA colonization surveillance program. It is not intended to diagnose MRSA infection nor to guide or monitor treatment for MRSA infections. Test performance is not FDA approved in patients less than 21 years old. Performed at [facility], [address] [place], [place] [address]         Radiology Studies: DG Chest 1 View  Result Date: 08/08/2021 CLINICAL DATA:  Fall EXAM: CHEST  1 VIEW COMPARISON:  10/13/2018 FINDINGS: Patchy atelectasis or minimal infiltrate at lingula. No pleural effusion. Normal cardiac size. Aortic atherosclerosis. No pneumothorax IMPRESSION: Patchy atelectasis or minimal infiltrate at the lingula. Electronically Signed   By: Jasmine Pang M.D.   On: 08/08/2021 20:19   CT HEAD WO CONTRAST ( )  Result Date: 08/09/2021 CLINICAL DATA:  Subdural hematoma, repeat evaluation. August 08, 2021. EXAM: CT HEAD WITHOUT CONTRAST TECHNIQUE: Contiguous axial images were obtained from the base of the skull through the vertex without intravenous contrast. COMPARISON:  August 08, 2021. FINDINGS: Brain: LEFT posterior parafalcine subdural hematoma seen posteriorly in the occipital region is unchanged, no mass effect, no midline shift and no hydrocephalus. No new area of subdural hematoma or intracranial hemorrhage. Signs of atrophy and chronic microvascular ischemic change as before. Vascular: No hyperdense vessel or unexpected calcification. Skull: Normal. Negative for fracture or focal lesion. Sinuses/Orbits: Visualized paranasal sinuses and orbits  are unremarkable. Other: Moderately large RIGHT posterior parieto-occipital occipital scalp hematoma is similar to the prior exam. IMPRESSION: 1. Stable small parafalcine subdural hematoma posteriorly in the occipital region. No mass effect, no midline shift and no hydrocephalus. 2. Moderately large RIGHT posterior parieto-occipital scalp hematoma is similar to the prior exam. 3. Signs of atrophy and chronic microvascular ischemic change as before. Electronically Signed   By: Donzetta Kohut M.D.   On: 08/09/2021 08:57   CT Head Wo Contrast  Result Date: 08/08/2021 CLINICAL DATA:  Head neck trauma EXAM: CT HEAD WITHOUT CONTRAST CT CERVICAL SPINE WITHOUT CONTRAST TECHNIQUE: Multidetector CT imaging of the head and cervical spine was performed following the standard protocol without intravenous contrast. Multiplanar CT image reconstructions of the cervical spine were also generated. COMPARISON:  CT brain 01/29/2021, CT brain and cervical spine 02/05/2017 FINDINGS: CT HEAD FINDINGS Brain: No acute territorial infarction or intracranial mass is visualized. Trace 3 mm left posterior parafalcine subdural hematoma, series 2, image 18, without mass effect or midline shift. Atrophy and chronic small vessel ischemic  changes of the white matter. Stable ventricle size. Vascular: No hyperdense vessel.  Carotid vascular calcification Skull: No depressed skull fracture Sinuses/Orbits: No acute finding. Other: Large right parietooccipital scalp hematoma CT CERVICAL SPINE FINDINGS Alignment: Straightening of the cervical spine. No subluxation. Facet alignment is maintained Skull base and vertebrae: No acute fracture. No primary bone lesion or focal pathologic process. Soft tissues and spinal canal: No prevertebral fluid or swelling. No visible canal hematoma. Disc levels: Post fusion changes C3 through C6 with anterior plate and fixating screws. Solid bone fusion is present. Moderate degenerative change at C6-C7. Facet  degenerative changes at multiple levels with foraminal narrowing. Upper chest: Lung apices clear. 2.5 cm hypodense nodule in the right lobe of thyroid. In the setting of significant comorbidities or limited life expectancy, no follow-up recommended (ref: J Am Coll Radiol. 2015 Feb;12(2): 143-50). Other: None IMPRESSION: 1. Acute 3 mm left posterior parafalcine subdural hematoma without significant mass effect or midline shift. Atrophy and chronic small vessel ischemic changes of the white matter. 2. Large right parietooccipital scalp hematoma 3. Post fusion changes of cervical spine C3 through C6. No acute osseous abnormality Critical Value/emergent results were called by telephone at the time of interpretation on 08/08/2021 at 8:36 pm to provider [facility] , who verbally acknowledged these results. Electronically Signed   By: Jasmine Pang M.D.   On: 08/08/2021 20:36   CT Cervical Spine Wo Contrast  Result Date: 08/08/2021 CLINICAL DATA:  Head neck trauma EXAM: CT HEAD WITHOUT CONTRAST CT CERVICAL SPINE WITHOUT CONTRAST TECHNIQUE: Multidetector CT imaging of the head and cervical spine was performed following the standard protocol without intravenous contrast. Multiplanar CT image reconstructions of the cervical spine were also generated. COMPARISON:  CT brain 01/29/2021, CT brain and cervical spine 02/05/2017 FINDINGS: CT HEAD FINDINGS Brain: No acute territorial infarction or intracranial mass is visualized. Trace 3 mm left posterior parafalcine subdural hematoma, series 2, image 18, without mass effect or midline shift. Atrophy and chronic small vessel ischemic changes of the white matter. Stable ventricle size. Vascular: No hyperdense vessel.  Carotid vascular calcification Skull: No depressed skull fracture Sinuses/Orbits: No acute finding. Other: Large right parietooccipital scalp hematoma CT CERVICAL SPINE FINDINGS Alignment: Straightening of the cervical spine. No subluxation. Facet alignment is  maintained Skull base and vertebrae: No acute fracture. No primary bone lesion or focal pathologic process. Soft tissues and spinal canal: No prevertebral fluid or swelling. No visible canal hematoma. Disc levels: Post fusion changes C3 through C6 with anterior plate and fixating screws. Solid bone fusion is present. Moderate degenerative change at C6-C7. Facet degenerative changes at multiple levels with foraminal narrowing. Upper chest: Lung apices clear. 2.5 cm hypodense nodule in the right lobe of thyroid. In the setting of significant comorbidities or limited life expectancy, no follow-up recommended (ref: J Am Coll Radiol. 2015 Feb;12(2): 143-50). Other: None IMPRESSION: 1. Acute 3 mm left posterior parafalcine subdural hematoma without significant mass effect or midline shift. Atrophy and chronic small vessel ischemic changes of the white matter. 2. Large right parietooccipital scalp hematoma 3. Post fusion changes of cervical spine C3 through C6. No acute osseous abnormality Critical Value/emergent results were called by telephone at the time of interpretation on 08/08/2021 at 8:36 pm to provider [facility] , who verbally acknowledged these results. Electronically Signed   By: Jasmine Pang M.D.   On: 08/08/2021 20:36   CT Hip Right Wo Contrast  Result Date: 08/08/2021 CLINICAL DATA:  Fall EXAM: CT OF THE RIGHT  HIP WITHOUT CONTRAST TECHNIQUE: Multidetector CT imaging of the right hip was performed according to the standard protocol. Multiplanar CT image reconstructions were also generated. COMPARISON:  None. FINDINGS: Bones/Joint/Cartilage No fracture of the right hip. Ligaments Suboptimally assessed by CT. Muscles and Tendons Unremarkable Soft tissues Atherosclerotic calcification in the right femoral artery. Soft tissues otherwise unremarkable. IMPRESSION: No fracture of the right hip. Electronically Signed   By: Deatra Robinson M.D.   On: 08/08/2021 23:10   DG HIP UNILAT WITH PELVIS 2-3 VIEWS  LEFT  Result Date: 08/08/2021 CLINICAL DATA:  Fall EXAM: DG HIP (WITH OR WITHOUT PELVIS) 2-3V LEFT COMPARISON:  None. FINDINGS: Left pubic rami appear intact. No definitive fracture or malalignment. IMPRESSION: No definite acute osseous abnormality. Electronically Signed   By: Jasmine Pang M.D.   On: 08/08/2021 20:20   DG HIP UNILAT WITH PELVIS 2-3 VIEWS RIGHT  Result Date: 08/08/2021 CLINICAL DATA:  Fall EXAM: DG HIP (WITH OR WITHOUT PELVIS) 2-3V RIGHT COMPARISON:  None. FINDINGS: SI joints are non widened. Pubic symphysis and rami appear intact. Limited evaluation of femoral neck, question step-off deformity at the base of the femoral neck IMPRESSION: Questionable step-off deformity/fracture at base of femoral neck. Suggest CT for further evaluation. Electronically Signed   By: Jasmine Pang M.D.   On: 08/08/2021 20:22        Scheduled Meds:  carvedilol  6.25 mg Oral BID WC   chlorhexidine  15 mL Mouth Rinse BID   Chlorhexidine Gluconate Cloth  6 each Topical Q0600   insulin aspart  0-9 Units Subcutaneous Q4H   pantoprazole  40 mg Oral Daily   sodium chloride flush  10-40 mL Intracatheter Q12H   Continuous Infusions:  sodium chloride Stopped (08/09/21 0652)     LOS: 2 days     Jacquelin Hawking, MD [facility] 08/10/2021, 11:36 AM  If 7PM-7AM, please contact night-coverage [email or web address]

## 2021-08-10 NOTE — Progress Notes (Signed)
PCCM will sign off, glad to be reinvolved as condition changes.  Nathan Jasmin Trumbull  [place] [facility]/Critical [facility]  

## 2021-08-10 NOTE — Progress Notes (Signed)
Unable to draw labs from midline as no blood return. Phlebotomy called for AM labs-spoke to [facility], will draw when able.

## 2021-08-11 DIAGNOSIS — E876 Hypokalemia: Secondary | ICD-10-CM | POA: Diagnosis not present

## 2021-08-11 DIAGNOSIS — N182 Chronic kidney disease, stage 2 (mild): Secondary | ICD-10-CM | POA: Diagnosis not present

## 2021-08-11 DIAGNOSIS — E119 Type 2 diabetes mellitus without complications: Secondary | ICD-10-CM | POA: Diagnosis not present

## 2021-08-11 DIAGNOSIS — S065X9A Traumatic subdural hemorrhage with loss of consciousness of unspecified duration, initial encounter: Secondary | ICD-10-CM | POA: Diagnosis not present

## 2021-08-11 DIAGNOSIS — S0003XA Contusion of scalp, initial encounter: Secondary | ICD-10-CM

## 2021-08-11 LAB — GLUCOSE, CAPILLARY
Glucose-Capillary: 110 mg/dL — ABNORMAL HIGH (ref 70–99)
Glucose-Capillary: 117 mg/dL — ABNORMAL HIGH (ref 70–99)
Glucose-Capillary: 132 mg/dL — ABNORMAL HIGH (ref 70–99)
Glucose-Capillary: 135 mg/dL — ABNORMAL HIGH (ref 70–99)
Glucose-Capillary: 153 mg/dL — ABNORMAL HIGH (ref 70–99)
Glucose-Capillary: 169 mg/dL — ABNORMAL HIGH (ref 70–99)
Glucose-Capillary: 89 mg/dL (ref 70–99)
Glucose-Capillary: 96 mg/dL (ref 70–99)

## 2021-08-11 LAB — BASIC METABOLIC PANEL
Anion gap: 9 (ref 5–15)
BUN: 15 mg/dL (ref 8–23)
CO2: 24 mmol/L (ref 22–32)
Calcium: 8.2 mg/dL — ABNORMAL LOW (ref 8.9–10.3)
Chloride: 94 mmol/L — ABNORMAL LOW (ref 98–111)
Creatinine, Ser: 0.93 mg/dL (ref 0.44–1.00)
GFR, Estimated: 59 mL/min — ABNORMAL LOW (ref >60)
Glucose, Bld: 177 mg/dL — ABNORMAL HIGH (ref 70–99)
Potassium: 3.5 mmol/L (ref 3.5–5.1)
Sodium: 127 mmol/L — ABNORMAL LOW (ref 135–145)

## 2021-08-11 MED ORDER — HYDROCODONE-ACETAMINOPHEN 5-325 MG PO TABS
1.0000 | ORAL_TABLET | Freq: Four times a day (QID) | ORAL | Status: DC | PRN
  Administered 2021-08-11 – 2021-08-12 (×3): 1 via ORAL
  Filled 2021-08-11 (×3): qty 1

## 2021-08-11 MED ORDER — FENTANYL CITRATE (PF) 100 MCG/2ML IJ SOLN
12.5000 ug | Freq: Once | INTRAMUSCULAR | Status: AC
  Administered 2021-08-11: 12.5 ug via INTRAVENOUS
  Filled 2021-08-11: qty 2

## 2021-08-11 MED ORDER — ADULT MULTIVITAMIN W/MINERALS CH
1.0000 | ORAL_TABLET | Freq: Every day | ORAL | Status: DC
  Administered 2021-08-11 – 2021-08-12 (×2): 1 via ORAL
  Filled 2021-08-11 (×2): qty 1

## 2021-08-11 MED ORDER — ENSURE ENLIVE PO LIQD
237.0000 mL | Freq: Two times a day (BID) | ORAL | Status: DC
  Administered 2021-08-11 – 2021-08-12 (×3): 237 mL via ORAL

## 2021-08-11 NOTE — Progress Notes (Signed)
Initial Nutrition Assessment  DOCUMENTATION CODES:   Underweight  INTERVENTION:   -Ensure Enlive po BID, each supplement provides 350 kcal and 20 grams of protein  -MVI with minerals daily -Downgrade diet to dysphagia 3 (advanced mechanical soft) diet for ease of intake  NUTRITION DIAGNOSIS:   Increased nutrient needs related to acute illness (SDH) as evidenced by estimated needs.  GOAL:   Patient will meet greater than or equal to 90% of their needs  MONITOR:   PO intake, Supplement acceptance, Labs, Weight trends, Skin, I & O's  REASON FOR ASSESSMENT:   Consult Assessment of nutrition requirement/status  ASSESSMENT:   Robin Andrews ,is a 87 y.o. female, who presented to the [facility] ED with a chief complaint of fall  Pt admitted with SDH s/p fall.   Reviewed I/O's: +1.3 L x 24 hours and +1.9 L since admission  Spoke with pt and grandson at bedside. Noted pt consumed less than 25% of breakfast tray. Per pt grandson, pt is not fond of the hospital food and he has brought in outside food for her to eat. Observed pt consume a [employer] hamburger without difficulty during visit.   Pt grandson reports that pt adopted a more healthful diet around 10 years ago to help combat elevated blood sugar, HTN, and hypercholesterolemia. Pt consumes 3 meals per day and staple foods include fish, soup, steamed vegetables, and rice. Pt has a very good appetite, however, consumes soft foods due to lack of teeth.   Per grandson, pt with distant history of weight loss, but suspects wt has been stable over the past year. Pt has always been small framed. Pt shares that she is very active at home, but fell due to loss of balance. Reviewed wt hx; pt has experienced a 12.5% wt loss over the past 6 months, which is significant for time frame.   Discussed importance of good meal and supplement intake to promote healing. Pt amenable to Ensure and diet downgrade. Also gave pt grandson permission to bring in  outside food for pt to eat.   Labs reviewed: Na: 127, CBGS: 117-169 (inpatient orders for glycemic control are 0-9 units insulin aspart every 4 hours).    NUTRITION - FOCUSED PHYSICAL EXAM:  Flowsheet Row Most Recent Value  Orbital Region No depletion  Upper Arm Region Mild depletion  Thoracic and Lumbar Region No depletion  Buccal Region No depletion  Temple Region Mild depletion  Clavicle Bone Region Mild depletion  Clavicle and Acromion Bone Region No depletion  Scapular Bone Region No depletion  Dorsal Hand Moderate depletion  Patellar Region Moderate depletion  Anterior Thigh Region Moderate depletion  Posterior Calf Region Moderate depletion  Edema (RD Assessment) None  Hair Reviewed  Eyes Reviewed  Mouth Reviewed  Skin Reviewed  Nails Reviewed       Diet Order:   Diet Order             DIET DYS 3 Room service appropriate? Yes with Assist; Fluid consistency: Thin  Diet effective now                   EDUCATION NEEDS:   Education needs have been addressed  Skin:  Skin Assessment: Reviewed RN Assessment  Last BM:  08/11/21  Height:   Ht Readings from Last 1 Encounters:  08/09/21 5\' 3"  (1.6 m)    Weight:   Wt Readings from Last 1 Encounters:  08/10/21 39.9 kg    Ideal Body Weight:  52.3 kg  BMI:  Body mass index is 15.58 kg/m.  Estimated Nutritional Needs:   Kcal:  1200-1400  Protein:  50-65 grams  Fluid:  > 1.2 L    Levada Schilling, RD, LDN, CDCES Registered Dietitian II Certified Diabetes Care and Education Specialist Please refer to [facility] for RD and/or RD on-call/weekend/after hours pager

## 2021-08-11 NOTE — Progress Notes (Signed)
Interpreter named Long ID [phone] was used to assess patient.  She complained of a severe headache where she had a previous fall.  MD on call paged for something more substantial than tylenol.  Will await new orders, and report this off to primary nurse.

## 2021-08-11 NOTE — Evaluation (Signed)
Physical Therapy Evaluation Patient Details Name: Robin Andrews MRN: [id number] DOB: 1931-07-18 Today's Date: 08/11/2021   History of Present Illness  87 y.o. female, who presented to the [facility] ED 08/08/21 with a chief complaint of fall. +hyponatremic; +parafalcine SDH; + rt hip pain with CT negative for fracture;    PMH-HTN, DM, CKDIII  Clinical Impression   Pt admitted secondary to problem above with deficits below. PTA patient was living alone with family assisting with showering. Pt currently requires min assist for all mobility with pt unsteady on her feet (also likely due to right hip pain).  Anticipate patient will benefit from PT to address problems listed below.Will continue to follow acutely to maximize functional mobility independence and safety.       Follow Up Recommendations SNF;Supervision/Assistance - 24 hour    Equipment Recommendations  Rolling walker with 5" wheels    Recommendations for Other Services       Precautions / Restrictions Precautions Precautions: Fall Restrictions Weight Bearing Restrictions: No      Mobility  Bed Mobility Overal bed mobility: Needs Assistance Bed Mobility: Rolling;Sidelying to Sit Rolling: Supervision (with rail) Sidelying to sit: Min assist       General bed mobility comments: HOB elevated and assist to raise trunk from side to sit    Transfers Overall transfer level: Needs assistance Equipment used: Rolling walker (2 wheeled) Transfers: Sit to/from Stand Sit to Stand: Min assist         General transfer comment: boosting assist and steadying assist  Ambulation/Gait Ambulation/Gait assistance: Min assist Gait Distance (Feet): 3 Feet Assistive device: Rolling walker (2 wheeled) Gait Pattern/deviations: Step-to pattern;Decreased stride length     General Gait Details: steadying assist; very small steps  Stairs            Wheelchair Mobility    Modified Rankin (Stroke Patients Only)       Balance  Overall balance assessment: Needs assistance Sitting-balance support: No upper extremity supported;Feet unsupported Sitting balance-Leahy Scale: Good     Standing balance support: Bilateral upper extremity supported Standing balance-Leahy Scale: Poor                               Pertinent Vitals/Pain Pain Assessment: Faces Faces Pain Scale: Hurts little more Pain Location: headache Pain Descriptors / Indicators: Headache Pain Intervention(s): Limited activity within patient's tolerance;Monitored during session;Repositioned    Home Living Family/patient expects to be discharged to:: Skilled nursing facility                      Prior Function Level of Independence: Needs assistance   Gait / Transfers Assistance Needed: independent (has never fallen before)  ADL's / Homemaking Assistance Needed: family is present for showering when they are able to be        Hand Dominance        Extremity/Trunk Assessment   Upper Extremity Assessment Upper Extremity Assessment: Generalized weakness    Lower Extremity Assessment Lower Extremity Assessment: Generalized weakness    Cervical / Trunk Assessment Cervical / Trunk Assessment: Normal  Communication   Communication: Prefers language other than [place] (virtual interpreter used)  Cognition Arousal/Alertness: Awake/alert Behavior During Therapy: WFL for tasks assessed/performed Overall Cognitive Status: Impaired/Different from baseline Area of Impairment: Orientation                 Orientation Level: Place (time NT; thought in  [facility] hospital)  General Comments General comments (skin integrity, edema, etc.): Grandson arrived mid-session and MD at end of session. Discussed discharge plan and pt agreed to go to rehab prior to home    Exercises     Assessment/Plan    PT Assessment Patient needs continued PT services  PT Problem List Decreased  strength;Decreased activity tolerance;Decreased balance;Decreased mobility;Decreased knowledge of use of DME;Decreased safety awareness;Pain       PT Treatment Interventions Gait training;DME instruction;Functional mobility training;Therapeutic activities;Therapeutic exercise;Balance training;Patient/family education    PT Goals (Current goals can be found in the Care Plan section)  Acute Rehab PT Goals Patient Stated Goal: return home with nursing assistance PT Goal Formulation: With patient Time For Goal Achievement: 08/25/21 Potential to Achieve Goals: Good    Frequency Min 2X/week   Barriers to discharge Decreased caregiver support family cannot provide 24/7 support    Co-evaluation               AM-PAC PT "6 Clicks" Mobility  Outcome Measure Help needed turning from your back to your side while in a flat bed without using bedrails?: None Help needed moving from lying on your back to sitting on the side of a flat bed without using bedrails?: A Little Help needed moving to and from a bed to a chair (including a wheelchair)?: A Little Help needed standing up from a chair using your arms (e.g., wheelchair or bedside chair)?: A Little Help needed to walk in hospital room?: A Little Help needed climbing 3-5 steps with a railing? : Total 6 Click Score: 17    End of Session   Activity Tolerance: Patient limited by fatigue Patient left: in chair;with call bell/phone within reach;with chair alarm set;with family/visitor present Nurse Communication: Mobility status PT Visit Diagnosis: Unsteadiness on feet (R26.81)    Time: 1856-3149 PT Time Calculation (min) (ACUTE ONLY): 25 min   Charges:   PT Evaluation $PT Eval Low Complexity: 1 Low PT Treatments $Therapeutic Activity: 8-22 mins         Jerolyn Center, PT Pager [phone]   Zena Amos 08/11/2021, 11:56 AM

## 2021-08-11 NOTE — TOC Progression Note (Signed)
Transition of Care [facility]) - Progression Note    Patient Details  Name: Robin Andrews MRN: [id number] Date of Birth: Feb 19, 1931  [facility] CM/SW Contact  Bess Kinds, RN Phone Number: [phone] 08/11/2021, 4:14 PM  Clinical Narrative:     Notified by CSW that patient wanting to go home. Consent received from patient to contact daughter or grandson, Robin Andrews. Attempted to contact daughter using Language Line interpreter Selena Batten [phone]) daughter hung up on interpreter. Interpreter called Robin Andrews and left message in [place]  Expected Discharge Plan: Home w Home Health Services Barriers to Discharge: Continued Medical Work up  Expected Discharge Plan and Services Expected Discharge Plan: Home w Home Health Services       Living arrangements for the past 2 months: Single Family Home                                       Social Determinants of Health (SDOH) Interventions    Readmission Risk Interventions No flowsheet data found.

## 2021-08-11 NOTE — Care Management Important Message (Signed)
Important Message  Patient Details  Name: Robin Andrews MRN: [id number] Date of Birth: 21-Dec-1931   Medicare Important Message Given:  Yes     Dorena Bodo 08/11/2021, 4:03 PM

## 2021-08-11 NOTE — TOC Initial Note (Signed)
Transition of Care [facility]) - Initial/Assessment Note    Patient Details  Name: Robin Andrews MRN: [id number] Date of Birth: October 18, 1931  [facility]) CM/SW Contact:    Bethann Berkshire, [place] Phone Number: 08/11/2021, 4:05 PM  Clinical Narrative:                  CSW met with pt to discuss disposition. CSW used interpreter service to speak with pt. Explained SNF recommendation. Pt initially presents as on the fence. Whenever discussing SNF, pt repeatedly talks about reasons she cannot go to SNF such as paying rent and completing paperwork that is due in September. CSW explains purpose and process of SNF and offers possible solutions to barriers such as rent and paperwork. Pt continues to get stuck on barriers and eventually states she rather return home. She is vague at times regarding home situation. Explains she lives alone and has a daughter who lives in [place] and also mentions someone named Alease Frame who would probably drive her home. Alease Frame is listed as pt's grandson. Pt does state she would need a 3-in-1 and rolling walker. Pt consented for TOC to contact Daughter and grandson. CSW notified RNCM to arrange HH/DME.  Expected Discharge Plan: [place] Barriers to Discharge: Continued Medical Work up   Patient Goals and CMS Choice        Expected Discharge Plan and Services Expected Discharge Plan: [place]       Living arrangements for the past 2 months: Single Family Home                                      Prior Living Arrangements/Services Living arrangements for the past 2 months: Single Family Home Lives with:: Self Patient language and need for interpreter reviewed:: Yes        Need for Family Participation in Patient Care: Yes (Comment) Care giver support system in place?: No (comment)   Criminal Activity/Legal Involvement Pertinent to Current Situation/Hospitalization: No - Comment as needed  Activities of Daily  Living      Permission Sought/Granted   Permission granted to share information with : Yes, Verbal Permission Granted  Share Information with NAME: Daughter Janene Madeira and Michela Pitcher           Emotional Assessment Appearance:: Appears stated age Attitude/Demeanor/Rapport: Engaged Affect (typically observed): Appropriate Orientation: : Oriented to Self, Oriented to Place, Oriented to Situation Alcohol / Substance Use: Not Applicable Psych Involvement: No (comment)  Admission diagnosis:  Essential hypertension, benign [I10] Hyponatremia [E87.1] Subdural hematoma ([place]) [S06.5X9A] SDH (subdural hematoma) ([place]) [S06.5X9A] Fall [W19.XXXA] Controlled type 2 diabetes mellitus without complication, without long-term current use of insulin ([place]) [E11.9] Patient Active Problem List   Diagnosis Date Noted   Hematoma of occipital region of scalp 08/11/2021   SDH (subdural hematoma) ([place]) 08/08/2021   Hyponatremia 62/69/4854   Acute metabolic encephalopathy 62/70/3500   Vertigo 04/18/2020   Noninfectious otitis externa of right ear 04/18/2020   Chronic neck pain 11/09/2019   Chronic bilateral low back pain without sciatica 11/09/2019   Hypokalemia    Diabetes mellitus type 2, controlled, without complications ([place]) 93/81/8299   Cardiac murmur 12/18/2017   Cervical disc disease 05/25/2012   Chronic pain syndrome 05/25/2012   Osteoarthritis 05/25/2012   Hyperlipidemia 05/30/2007   HYPERTENSION, BENIGN ESSENTIAL 05/30/2007   ASTEATOTIC ECZEMA 05/30/2007   PCP:  Jani Gravel, MD Pharmacy:   [facility] [place] [address] Phone: [phone] Fax: [phone]     Social Determinants of Health (SDOH) Interventions    Readmission Risk Interventions No flowsheet data found.

## 2021-08-11 NOTE — Progress Notes (Signed)
PROGRESS NOTE    Robin Andrews  PVV:[id number] DOB: 01/06/1931 DOA: 08/08/2021 PCP: Pearson Grippe, MD   Brief Narrative: Robin Andrews is a 87 y.o. female with a history of diabetes mellitus, hypertension, CKD. Patient presented secondary to fall and was found to have a subdural hemorrhage in addition to severe hyponatremia. She required ICU admission and initiation of hypertonic saline. Neurosurgery was consulted for her SDH and recommended no surgical management; repeat CT head was stable 48 hours later. Hyponatremia improved with hypertonic saline.   Assessment & Plan:   Principal Problem:   SDH (subdural hematoma) (HCC) Active Problems:   HYPERTENSION, BENIGN ESSENTIAL   Diabetes mellitus type 2, controlled, without complications (HCC)   Hypokalemia   Hyponatremia   Acute metabolic encephalopathy   Hematoma of occipital region of scalp  Hyponatremia Patient presented with a sodium of 115. Etiology is likely related to poor oral intake, but also in setting of TBI in addition to SSRI usage. Patient was initially managed with hypertonic IV fluids followed by observation with appropriate improvement of sodium to 127. Cortisol of 23. -Continue NS IV fluids -Continue Regular diet  Parafalcine subdural hemorrhage Identified on CT obtained on 8/19. Secondary to fall and subsequent head trauma. Neurosurgery was consulted and recommended no surgical intervention. Repeat CT head on 8/20 significant for stable SDH.  Headache Secondary to SDH, scalp hematoma and likely concussion. -Norco prn  Acute metabolic encephalopathy Thought to be secondary to hyponatremia vs SDH vs TBI. Possible patient has had a concussion. Appears to be at/near baseline.  Scalp hematoma Secondary to fall.  Anemia Chronic from February. Stable.  Elevated troponin Troponin of 38 with a delta of 40. No chest pain. Per chart review, thought to be secondary to demand ischemia.  Primary hypertension Patient is  on amlodipine as an outpatient although it is reported as not taking  CKD stage II Patient does not meet criteria for CKD stage III.  Hypokalemia Given supplementation. -BMP today  Diabetes mellitus, type 2 Patient is on glimepiride as an outpatient although reported as not taking. Hemoglobin A1C of 5% -Continue SSI while inpatient; likely discontinue glimepiride on discharge  Fall Secondary to falling during ambulation. Reported no LOC. Head trauma as mentioned above. -PT/OT   DVT prophylaxis: SCDs Code Status:   Code Status: DNR Family Communication: Grandson at bedside Disposition Plan: Likely medically ready for discharge to SNF in 24 hours   Consultants:  Neurosurgery PCCM  Procedures:  None  Antimicrobials: None    Subjective:  [place] Interpreter  Headache today and is hungry. No other concerns.  Objective: Vitals:   08/11/21 0400 08/11/21 0639 08/11/21 0806 08/11/21 1224  BP: 122/60  (!) 136/56 (!) 113/49  Pulse: 63 73 79 70  Resp:   20 (!) 22  Temp:  97.9 F (36.6 C) 97.7 F (36.5 C) 97.7 F (36.5 C)  TempSrc:  Axillary Axillary Oral  SpO2: 99% 97% 100% 97%  Weight:      Height:        Intake/Output Summary (Last 24 hours) at 08/11/2021 1431 Last data filed at 08/11/2021 0500 Gross per 24 hour  Intake 1137.47 ml  Output --  Net 1137.47 ml   Filed Weights   08/09/21 0300 08/10/21 0500  Weight: 39.7 kg 39.9 kg    Examination:  General exam: Appears calm and comfortable Respiratory system: Clear to auscultation. Respiratory effort normal. Cardiovascular system: S1 & S2 heard, RRR. No murmurs, rubs, gallops or clicks. Gastrointestinal system:  Abdomen is nondistended, soft and nontender. No organomegaly or masses felt. Normal bowel sounds heard. Central nervous system: Alert and oriented to person and that she is in a hospital but thought she was in [place]. No focal neurological deficits. Musculoskeletal: No edema. No calf  tenderness. Occipital hematoma without significant tenderness Skin: No cyanosis. No rashes Psychiatry: Judgement and insight appear normal. Mood & affect appropriate.    Data Reviewed: I have personally reviewed following labs and imaging studies  CBC Lab Results  Component Value Date   WBC 7.3 08/10/2021   RBC 3.04 (L) 08/10/2021   HGB 9.1 (L) 08/10/2021   HCT 26.5 (L) 08/10/2021   MCV 87.2 08/10/2021   MCH 29.9 08/10/2021   PLT 240 08/10/2021   MCHC 34.3 08/10/2021   RDW 11.7 08/10/2021   LYMPHSABS 1.1 08/08/2021   MONOABS 0.8 08/08/2021   EOSABS 0.4 08/08/2021   BASOSABS 0.1 08/08/2021     Last metabolic panel Lab Results  Component Value Date   NA 127 (L) 08/11/2021   K 3.5 08/11/2021   CL 94 (L) 08/11/2021   CO2 24 08/11/2021   BUN 15 08/11/2021   CREATININE 0.93 08/11/2021   GLUCOSE 177 (H) 08/11/2021   GFRNONAA 59 (L) 08/11/2021   GFRAA 68 06/27/2020   CALCIUM 8.2 (L) 08/11/2021   PHOS 3.6 08/08/2021   PROT 6.4 (L) 08/08/2021   ALBUMIN 3.4 (L) 08/08/2021   LABGLOB 2.6 06/27/2020   AGRATIO 1.8 06/27/2020   BILITOT 0.6 08/08/2021   ALKPHOS 62 08/08/2021   AST 30 08/08/2021   ALT 14 08/08/2021   ANIONGAP 9 08/11/2021    CBG (last 3)  Recent Labs    08/11/21 0555 08/11/21 0812 08/11/21 1224  GLUCAP 117* 169* 135*      GFR: Estimated Creatinine Clearance: 26.3 mL/min (by C-G formula based on SCr of 0.93 mg/dL).  Coagulation Profile: Recent Labs  Lab 08/08/21 2233  INR 1.0     Recent Results (from the past 240 hour(s))  Resp Panel by RT-PCR (Flu A&B, Covid) Nasopharyngeal Swab     Status: None   Collection Time: 08/08/21  7:22 PM   Specimen: Nasopharyngeal Swab; Nasopharyngeal(NP) swabs in vial transport medium  Result Value Ref Range Status   SARS Coronavirus 2 by RT PCR NEGATIVE NEGATIVE Final    Comment: (NOTE) SARS-CoV-2 target nucleic acids are NOT DETECTED.  The SARS-CoV-2 RNA is generally detectable in upper  respiratory specimens during the acute phase of infection. The lowest concentration of SARS-CoV-2 viral copies this assay can detect is 138 copies/mL. A negative result does not preclude SARS-Cov-2 infection and should not be used as the sole basis for treatment or other patient management decisions. A negative result may occur with  improper specimen collection/handling, submission of specimen other than nasopharyngeal swab, presence of viral mutation(s) within the areas targeted by this assay, and inadequate number of viral copies(<138 copies/mL). A negative result must be combined with clinical observations, patient history, and epidemiological information. The expected result is Negative.  Fact Sheet for Patients:  BloggerCourse.com  Fact Sheet for Healthcare Providers:  SeriousBroker.it  This test is no t yet approved or cleared by the [place] FDA and  has been authorized for detection and/or diagnosis of SARS-CoV-2 by FDA under an Emergency Use Authorization (EUA). This EUA will remain  in effect (meaning this test can be used) for the duration of the COVID-19 declaration under Section 564(b)(1) of the Act, 21 U.S.C.section 360bbb-3(b)(1), unless the authorization is  terminated  or revoked sooner.       Influenza A by PCR NEGATIVE NEGATIVE Final   Influenza B by PCR NEGATIVE NEGATIVE Final    Comment: (NOTE) The Xpert Xpress SARS-CoV-2/FLU/RSV plus assay is intended as an aid in the diagnosis of influenza from Nasopharyngeal swab specimens and should not be used as a sole basis for treatment. Nasal washings and aspirates are unacceptable for Xpert Xpress SARS-CoV-2/FLU/RSV testing.  Fact Sheet for Patients: BloggerCourse.com  Fact Sheet for Healthcare Providers: SeriousBroker.it  This test is not yet approved or cleared by the [place] FDA and has been  authorized for detection and/or diagnosis of SARS-CoV-2 by FDA under an Emergency Use Authorization (EUA). This EUA will remain in effect (meaning this test can be used) for the duration of the COVID-19 declaration under Section 564(b)(1) of the Act, 21 U.S.C. section 360bbb-3(b)(1), unless the authorization is terminated or revoked.  Performed at [facility], [address] [place], [place] [address]   MRSA Next Gen by PCR, Nasal     Status: None   Collection Time: 08/09/21  4:19 AM   Specimen: Nasal Mucosa; Nasal Swab  Result Value Ref Range Status   MRSA by PCR Next Gen NOT DETECTED NOT DETECTED Final    Comment: (NOTE) The GeneXpert MRSA Assay (FDA approved for NASAL specimens only), is one component of a comprehensive MRSA colonization surveillance program. It is not intended to diagnose MRSA infection nor to guide or monitor treatment for MRSA infections. Test performance is not FDA approved in patients less than 62 years old. Performed at [facility], [address], [place], [place] [address]          Radiology Studies: No results found.      Scheduled Meds:  carvedilol  6.25 mg Oral BID WC   chlorhexidine  15 mL Mouth Rinse BID   Chlorhexidine Gluconate Cloth  6 each Topical Q0600   feeding supplement  237 mL Oral BID BM   insulin aspart  0-9 Units Subcutaneous Q4H   multivitamin with minerals  1 tablet Oral Daily   pantoprazole  40 mg Oral Daily   sodium chloride flush  10-40 mL Intracatheter Q12H   Continuous Infusions:  sodium chloride Stopped (08/09/21 0652)   sodium chloride 100 mL/hr at 08/11/21 0101     LOS: 3 days     Jacquelin Hawking, MD [facility] 08/11/2021, 2:31 PM  If 7PM-7AM, please contact night-coverage [email or web address]

## 2021-08-11 NOTE — Evaluation (Signed)
Occupational Therapy Evaluation Patient Details Name: Robin Andrews MRN: [id number] DOB: 1931/02/01 Today's Date: 08/11/2021    History of Present Illness 87 y.o. female, who presented to the [facility] ED 08/08/21 with a chief complaint of fall. +hyponatremic; +parafalcine SDH; + rt hip pain with CT negative for fracture;    PMH-HTN, DM, CKDIII   Clinical Impression   No family with pt and no answer to video interpreter. Pt presents with HA, unsteady gait and impulsivity. Difficult to assess cognition due to language barrier. Pt requires up to min assist for ADL and mobility. She ambulated in hall with RW and min guard assist. Recommending rehab in SNF, per PT, grandson is in agreement.     Follow Up Recommendations  SNF;Supervision/Assistance - 24 hour    Equipment Recommendations  3 in 1 bedside commode    Recommendations for Other Services       Precautions / Restrictions Precautions Precautions: Fall      Mobility Bed Mobility Overal bed mobility: Needs Assistance Bed Mobility: Supine to Sit;Sit to Supine     Supine to sit: Min assist Sit to supine: Supervision   General bed mobility comments: assist to raise trunk    Transfers Overall transfer level: Needs assistance Equipment used: Rolling walker (2 wheeled) Transfers: Sit to/from Stand Sit to Stand: Min assist         General transfer comment: impulsive, requiring min assist    Balance Overall balance assessment: Needs assistance Sitting-balance support: No upper extremity supported;Feet unsupported Sitting balance-Leahy Scale: Good     Standing balance support: Bilateral upper extremity supported Standing balance-Leahy Scale: Poor                             ADL either performed or assessed with clinical judgement   ADL Overall ADL's : Needs assistance/impaired Eating/Feeding: Set up;Sitting   Grooming: Brushing hair;Standing;Min guard   Upper Body Bathing: Supervision/ safety;Sitting    Lower Body Bathing: Sit to/from stand;Minimal assistance   Upper Body Dressing : Set up;Sitting   Lower Body Dressing: Bed level;Minimal assistance Lower Body Dressing Details (indicate cue type and reason): donned flip flops in bed Toilet Transfer: Min guard;Ambulation;RW   Toileting- Architect and Hygiene: Min guard;Sit to/from stand       Functional mobility during ADLs: Min guard;Rolling walker       Vision Baseline Vision/History: 0 No visual deficits Patient Visual Report: No change from baseline       Perception     Praxis      Pertinent Vitals/Pain Pain Assessment: Faces Faces Pain Scale: Hurts little more Pain Location: headache Pain Descriptors / Indicators: Headache Pain Intervention(s): Monitored during session;Repositioned     Hand Dominance Right   Extremity/Trunk Assessment Upper Extremity Assessment Upper Extremity Assessment: Overall WFL for tasks assessed   Lower Extremity Assessment Lower Extremity Assessment: Defer to PT evaluation   Cervical / Trunk Assessment Cervical / Trunk Assessment: Normal   Communication Communication Communication: Prefers language other than English   Cognition Arousal/Alertness: Awake/alert Behavior During Therapy: Impulsive Overall Cognitive Status: Difficult to assess                                     General Comments       Exercises     Shoulder Instructions      Home Living Family/patient expects to be discharged  to:: Skilled nursing facility                                        Prior Functioning/Environment Level of Independence: Needs assistance  Gait / Transfers Assistance Needed: independent (has never fallen before) ADL's / Homemaking Assistance Needed: family is present for showering when they are able to be Communication / Swallowing Assistance Needed: speaks [place]          OT Problem List: Decreased strength;Decreased activity  tolerance;Impaired balance (sitting and/or standing);Decreased safety awareness;Decreased knowledge of use of DME or AE;Pain      OT Treatment/Interventions: Self-care/ADL training;Therapeutic activities;Patient/family education;Balance training;DME and/or AE instruction    OT Goals(Current goals can be found in the care plan section) Acute Rehab OT Goals Patient Stated Goal: return home with nursing assistance OT Goal Formulation: With patient Time For Goal Achievement: 08/25/21 Potential to Achieve Goals: Good  OT Frequency: Min 2X/week   Barriers to D/C: Decreased caregiver support          Co-evaluation              AM-PAC OT "6 Clicks" Daily Activity     Outcome Measure Help from another person eating meals?: None Help from another person taking care of personal grooming?: A Little Help from another person toileting, which includes using toliet, bedpan, or urinal?: A Little Help from another person bathing (including washing, rinsing, drying)?: A Little Help from another person to put on and taking off regular upper body clothing?: None Help from another person to put on and taking off regular lower body clothing?: A Little 6 Click Score: 20   End of Session Equipment Utilized During Treatment: Rolling walker;Gait belt  Activity Tolerance: Patient tolerated treatment well Patient left: in bed;with call bell/phone within reach;with bed alarm set  OT Visit Diagnosis: Unsteadiness on feet (R26.81);Repeated falls (R29.6);Other symptoms and signs involving cognitive function;Pain;Muscle weakness (generalized) (M62.81)                Time: 2620-3559 OT Time Calculation (min): 16 min Charges:  OT General Charges $OT Visit: 1 Visit OT Evaluation $OT Eval Moderate Complexity: 1 Mod  Martie Round, OTR/L Acute Rehabilitation Services Pager: [phone] Office: [phone]  Evern Bio 08/11/2021, 4:13 PM

## 2021-08-11 NOTE — Progress Notes (Signed)
[facility] night shift MedSurg coverage note.  The nursing staff reported that the patient is having very intense headache on the area of previous trauma.  She is only on acetaminophen as needed at the moment, but this is not helping much.  Fentanyl 12.5 mg IVP x1 now.  I will let the primary hospitalist team address further treatment options later this morning.  Sanda Klein, MD

## 2021-08-12 ENCOUNTER — Other Ambulatory Visit (HOSPITAL_COMMUNITY): Payer: Self-pay

## 2021-08-12 DIAGNOSIS — S065X9A Traumatic subdural hemorrhage with loss of consciousness of unspecified duration, initial encounter: Secondary | ICD-10-CM | POA: Diagnosis not present

## 2021-08-12 LAB — BASIC METABOLIC PANEL
Anion gap: 6 (ref 5–15)
BUN: 23 mg/dL (ref 8–23)
CO2: 26 mmol/L (ref 22–32)
Calcium: 8 mg/dL — ABNORMAL LOW (ref 8.9–10.3)
Chloride: 100 mmol/L (ref 98–111)
Creatinine, Ser: 1.08 mg/dL — ABNORMAL HIGH (ref 0.44–1.00)
GFR, Estimated: 49 mL/min — ABNORMAL LOW (ref >60)
Glucose, Bld: 100 mg/dL — ABNORMAL HIGH (ref 70–99)
Potassium: 3.1 mmol/L — ABNORMAL LOW (ref 3.5–5.1)
Sodium: 132 mmol/L — ABNORMAL LOW (ref 135–145)

## 2021-08-12 LAB — GLUCOSE, CAPILLARY
Glucose-Capillary: 87 mg/dL (ref 70–99)
Glucose-Capillary: 132 mg/dL — ABNORMAL HIGH (ref 70–99)
Glucose-Capillary: 152 mg/dL — ABNORMAL HIGH (ref 70–99)

## 2021-08-12 MED ORDER — POLYETHYLENE GLYCOL 3350 17 GM/SCOOP PO POWD
17.0000 g | Freq: Every day | ORAL | 0 refills | Status: DC | PRN
  Filled 2021-08-12: qty 510, 30d supply, fill #0

## 2021-08-12 MED ORDER — ENSURE ENLIVE PO LIQD: 237.0000 mL | Freq: Two times a day (BID) | ORAL | 12 refills | Status: DC

## 2021-08-12 MED ORDER — DOCUSATE SODIUM 100 MG PO CAPS
100.0000 mg | ORAL_CAPSULE | Freq: Two times a day (BID) | ORAL | 0 refills | Status: DC | PRN
  Filled 2021-08-12: qty 10, 5d supply, fill #0

## 2021-08-12 MED ORDER — AMLODIPINE BESYLATE 5 MG PO TABS
5.0000 mg | ORAL_TABLET | Freq: Every day | ORAL | 2 refills | Status: DC
  Filled 2021-08-12: qty 30, 30d supply, fill #0

## 2021-08-12 MED ORDER — HYDROCODONE-ACETAMINOPHEN 5-325 MG PO TABS
1.0000 | ORAL_TABLET | Freq: Four times a day (QID) | ORAL | 0 refills | Status: DC | PRN
  Filled 2021-08-12: qty 30, 4d supply, fill #0

## 2021-08-12 MED ORDER — ADULT MULTIVITAMIN W/MINERALS CH: 1.0000 | ORAL_TABLET | Freq: Every day | ORAL | Status: AC

## 2021-08-12 MED ORDER — POTASSIUM CHLORIDE CRYS ER 20 MEQ PO TBCR
40.0000 meq | EXTENDED_RELEASE_TABLET | Freq: Once | ORAL | Status: AC
  Administered 2021-08-12: 40 meq via ORAL
  Filled 2021-08-12: qty 2

## 2021-08-12 NOTE — Discharge Summary (Signed)
Physician Discharge Summary  Robin Andrews JFH:[id number] DOB: [birth year] DOA: 08/08/2021  PCP: Jani Gravel, MD  Admit date: 08/08/2021 Discharge date: 08/12/2021  Admitted From: Home Disposition: Home  Recommendations for Outpatient Follow-up:  Follow up with PCP in 1 week Continue Norco as needed for pain Please obtain BMP/CBC in one week Please follow up on the following pending results: None  Home Health: PT/OT Equipment/Devices: Rolling walker  Discharge Condition: Stable CODE STATUS: DNR Diet recommendation: Regular diet   Brief/Interim Summary:  Admission HPI written by Hillery Aldo, NP  History of Present Illness:   Robin Andrews ,is a 87 y.o. female, who presented to the [facility] ED with a chief complaint of fall   They have a pertinent past medical history of HTN, DM2, and CKD.   A translator was used [phone]) for the HPI and exam. The patient was confused and repetitive at times during exam limiting accuracy. No family present at bedside.  History was obtained from exam and chart review.   Robin Andrews reported falling some time between 1-2 days ago while walking to the bathroom. She called EMS the evening of 8/19. At the [facility] ED she was found to be hyponatremic (NA 115) a parafalcine SDH was seen on [facility] for which neurosurgery was consulted. She was also found to have a scalp hematoma and a questionable right femoral neck fracture. She was a GCS of 15 on exam but had periods of confusion and repetitiveness upon exam   PCCM was consulted for admission.   Hospital course:  Hyponatremia Patient presented with a sodium of 115. Etiology is likely related to poor oral intake, but also in setting of TBI in addition to SSRI usage. Patient was initially managed with hypertonic IV fluids followed by eventual transition to normal saline. Sodium of 132 prior to discharge. Repeat BMP as an outpatient.   Parafalcine subdural hemorrhage Identified on CT obtained on 8/19. Secondary to  fall and subsequent head trauma. Neurosurgery was consulted and recommended no surgical intervention. Repeat CT head on 8/20 significant for stable SDH.   Headache Secondary to SDH, scalp hematoma; most pain appears to be related to her scalp hematoma. Norco for pain.  Acute metabolic encephalopathy Thought to be secondary to hyponatremia vs SDH vs TBI. Possible patient has had a concussion. Appears to be at/near baseline.   Scalp hematoma Secondary to fall.   Anemia Chronic from February. Stable.   Elevated troponin Troponin of 38 with a delta of 40. No chest pain. Per chart review, thought to be secondary to demand ischemia.   Primary hypertension Patient is on amlodipine as an outpatient although it is reported as not taking   CKD stage II Patient does not meet criteria for CKD stage III.   Hypokalemia Given supplementation. Kdur.   Diabetes mellitus, type 2 Patient is on glimepiride as an outpatient although reported as not taking. Hemoglobin A1C of 5%. Discontinue glimepiride on discharge.   Fall Secondary to falling during ambulation. Reported no LOC. Head trauma as mentioned above. PT/OT recommending SNF but patient declines and is requesting home discharge. Home health set up.  Discharge Diagnoses:  Principal Problem:   SDH (subdural hematoma) (HCC) Active Problems:   HYPERTENSION, BENIGN ESSENTIAL   Diabetes mellitus type 2, controlled, without complications (HCC)   Hypokalemia   Hyponatremia   Acute metabolic encephalopathy   Hematoma of occipital region of scalp    Discharge Instructions   Allergies as of 08/12/2021  Reactions   Hydrochlorothiazide    Severe hyponatremia resulting in hospitalization   Lisinopril         Medication List     STOP taking these medications    acetic acid-hydrocortisone OTIC solution Commonly known as: VOSOL-HC   albuterol 108 (90 Base) MCG/ACT inhaler Commonly known as: VENTOLIN HFA   glimepiride 1 MG  tablet Commonly known as: AMARYL   meclizine 25 MG tablet Commonly known as: ANTIVERT       TAKE these medications    amLODipine 5 MG tablet Commonly known as: NORVASC Take 1 tablet (5 mg total) by mouth daily. TAKE 1 TABLET(10 MG) BY MOUTH DAILY What changed:  medication strength how much to take how to take this when to take this   Blood Pressure Kit Devi 1 Device by Does not apply route daily.   docusate sodium 100 MG capsule Commonly known as: COLACE Take 1 capsule (100 mg total) by mouth 2 (two) times daily as needed for mild constipation.   feeding supplement Liqd Take 237 mLs by mouth 2 (two) times daily between meals. Start taking on: August 13, 2021   HYDROcodone-acetaminophen 5-325 MG tablet Commonly known as: NORCO/VICODIN Take 1-2 tablets by mouth every 6 (six) hours as needed for moderate pain or severe pain (Headache).   multivitamin with minerals Tabs tablet Take 1 tablet by mouth daily. Start taking on: August 13, 2021   polyethylene glycol powder 17 GM/SCOOP powder Commonly known as: GLYCOLAX/MIRALAX Take 17 g by mouth daily as needed for moderate constipation.               Durable Medical Equipment  (From admission, onward)           Start     Ordered   08/12/21 1128  For home use only DME 3 n 1  Once        08/12/21 1130   08/12/21 1128  For home use only DME Walker rolling  Once       Question Answer Comment  Walker: With 5 Inch Wheels   Patient needs a walker to treat with the following condition Generalized weakness      08/12/21 1130            Follow-up Information     [facility], [facility] Follow up.   Specialty: Home Health Services Why: Someone from [facility] will call you to arrange start date and time for your therapy. Contact information: [place] [address] [phone]         Jani Gravel, MD. Schedule an appointment as soon as possible for a visit in 1  week(s).   Specialty: Internal Medicine Why: For hospital follow-up Contact information: [address] [place] [address] [phone]                Allergies  Allergen Reactions   Hydrochlorothiazide     Severe hyponatremia resulting in hospitalization   Lisinopril     Consultations: Neurosurgery PCCM   Procedures/Studies: DG Chest 1 View  Result Date: 08/08/2021 CLINICAL DATA:  Fall EXAM: CHEST  1 VIEW COMPARISON:  10/13/2018 FINDINGS: Patchy atelectasis or minimal infiltrate at lingula. No pleural effusion. Normal cardiac size. Aortic atherosclerosis. No pneumothorax IMPRESSION: Patchy atelectasis or minimal infiltrate at the lingula. Electronically Signed   By: Donavan Foil M.D.   On: 08/08/2021 20:19   CT HEAD WO CONTRAST (5MM)  Result Date: 08/09/2021 CLINICAL DATA:  Subdural hematoma,  repeat evaluation. August 08, 2021. EXAM: CT HEAD WITHOUT CONTRAST TECHNIQUE: Contiguous axial images were obtained from the base of the skull through the vertex without intravenous contrast. COMPARISON:  August 08, 2021. FINDINGS: Brain: LEFT posterior parafalcine subdural hematoma seen posteriorly in the occipital region is unchanged, no mass effect, no midline shift and no hydrocephalus. No new area of subdural hematoma or intracranial hemorrhage. Signs of atrophy and chronic microvascular ischemic change as before. Vascular: No hyperdense vessel or unexpected calcification. Skull: Normal. Negative for fracture or focal lesion. Sinuses/Orbits: Visualized paranasal sinuses and orbits are unremarkable. Other: Moderately large RIGHT posterior parieto-occipital occipital scalp hematoma is similar to the prior exam. IMPRESSION: 1. Stable small parafalcine subdural hematoma posteriorly in the occipital region. No mass effect, no midline shift and no hydrocephalus. 2. Moderately large RIGHT posterior parieto-occipital scalp hematoma is similar to the prior exam. 3. Signs of  atrophy and chronic microvascular ischemic change as before. Electronically Signed   By: Zetta Bills M.D.   On: 08/09/2021 08:57   CT Head Wo Contrast  Result Date: 08/08/2021 CLINICAL DATA:  Head neck trauma EXAM: CT HEAD WITHOUT CONTRAST CT CERVICAL SPINE WITHOUT CONTRAST TECHNIQUE: Multidetector CT imaging of the head and cervical spine was performed following the standard protocol without intravenous contrast. Multiplanar CT image reconstructions of the cervical spine were also generated. COMPARISON:  CT brain 01/29/2021, CT brain and cervical spine 02/05/2017 FINDINGS: CT HEAD FINDINGS Brain: No acute territorial infarction or intracranial mass is visualized. Trace 3 mm left posterior parafalcine subdural hematoma, series 2, image 18, without mass effect or midline shift. Atrophy and chronic small vessel ischemic changes of the white matter. Stable ventricle size. Vascular: No hyperdense vessel.  Carotid vascular calcification Skull: No depressed skull fracture Sinuses/Orbits: No acute finding. Other: Large right parietooccipital scalp hematoma CT CERVICAL SPINE FINDINGS Alignment: Straightening of the cervical spine. No subluxation. Facet alignment is maintained Skull base and vertebrae: No acute fracture. No primary bone lesion or focal pathologic process. Soft tissues and spinal canal: No prevertebral fluid or swelling. No visible canal hematoma. Disc levels: Post fusion changes C3 through C6 with anterior plate and fixating screws. Solid bone fusion is present. Moderate degenerative change at C6-C7. Facet degenerative changes at multiple levels with foraminal narrowing. Upper chest: Lung apices clear. 2.5 cm hypodense nodule in the right lobe of thyroid. In the setting of significant comorbidities or limited life expectancy, no follow-up recommended (ref: J Am Coll Radiol. 2015 Feb;12(2): 143-50). Other: None IMPRESSION: 1. Acute 3 mm left posterior parafalcine subdural hematoma without significant  mass effect or midline shift. Atrophy and chronic small vessel ischemic changes of the white matter. 2. Large right parietooccipital scalp hematoma 3. Post fusion changes of cervical spine C3 through C6. No acute osseous abnormality Critical Value/emergent results were called by telephone at the time of interpretation on 08/08/2021 at 8:36 pm to provider [facility] , who verbally acknowledged these results. Electronically Signed   By: Donavan Foil M.D.   On: 08/08/2021 20:36   CT Cervical Spine Wo Contrast  Result Date: 08/08/2021 CLINICAL DATA:  Head neck trauma EXAM: CT HEAD WITHOUT CONTRAST CT CERVICAL SPINE WITHOUT CONTRAST TECHNIQUE: Multidetector CT imaging of the head and cervical spine was performed following the standard protocol without intravenous contrast. Multiplanar CT image reconstructions of the cervical spine were also generated. COMPARISON:  CT brain 01/29/2021, CT brain and cervical spine 02/05/2017 FINDINGS: CT HEAD FINDINGS Brain: No acute territorial infarction or intracranial mass is visualized. Trace  3 mm left posterior parafalcine subdural hematoma, series 2, image 18, without mass effect or midline shift. Atrophy and chronic small vessel ischemic changes of the white matter. Stable ventricle size. Vascular: No hyperdense vessel.  Carotid vascular calcification Skull: No depressed skull fracture Sinuses/Orbits: No acute finding. Other: Large right parietooccipital scalp hematoma CT CERVICAL SPINE FINDINGS Alignment: Straightening of the cervical spine. No subluxation. Facet alignment is maintained Skull base and vertebrae: No acute fracture. No primary bone lesion or focal pathologic process. Soft tissues and spinal canal: No prevertebral fluid or swelling. No visible canal hematoma. Disc levels: Post fusion changes C3 through C6 with anterior plate and fixating screws. Solid bone fusion is present. Moderate degenerative change at C6-C7. Facet degenerative changes at multiple levels  with foraminal narrowing. Upper chest: Lung apices clear. 2.5 cm hypodense nodule in the right lobe of thyroid. In the setting of significant comorbidities or limited life expectancy, no follow-up recommended (ref: J Am Coll Radiol. 2015 Feb;12(2): 143-50). Other: None IMPRESSION: 1. Acute 3 mm left posterior parafalcine subdural hematoma without significant mass effect or midline shift. Atrophy and chronic small vessel ischemic changes of the white matter. 2. Large right parietooccipital scalp hematoma 3. Post fusion changes of cervical spine C3 through C6. No acute osseous abnormality Critical Value/emergent results were called by telephone at the time of interpretation on 08/08/2021 at 8:36 pm to provider [facility] , who verbally acknowledged these results. Electronically Signed   By: Donavan Foil M.D.   On: 08/08/2021 20:36   CT Hip Right Wo Contrast  Result Date: 08/08/2021 CLINICAL DATA:  Fall EXAM: CT OF THE RIGHT HIP WITHOUT CONTRAST TECHNIQUE: Multidetector CT imaging of the right hip was performed according to the standard protocol. Multiplanar CT image reconstructions were also generated. COMPARISON:  None. FINDINGS: Bones/Joint/Cartilage No fracture of the right hip. Ligaments Suboptimally assessed by CT. Muscles and Tendons Unremarkable Soft tissues Atherosclerotic calcification in the right femoral artery. Soft tissues otherwise unremarkable. IMPRESSION: No fracture of the right hip. Electronically Signed   By: Ulyses Jarred M.D.   On: 08/08/2021 23:10   DG HIP UNILAT WITH PELVIS 2-3 VIEWS LEFT  Result Date: 08/08/2021 CLINICAL DATA:  Fall EXAM: DG HIP (WITH OR WITHOUT PELVIS) 2-3V LEFT COMPARISON:  None. FINDINGS: Left pubic rami appear intact. No definitive fracture or malalignment. IMPRESSION: No definite acute osseous abnormality. Electronically Signed   By: Donavan Foil M.D.   On: 08/08/2021 20:20   DG HIP UNILAT WITH PELVIS 2-3 VIEWS RIGHT  Result Date: 08/08/2021 CLINICAL DATA:   Fall EXAM: DG HIP (WITH OR WITHOUT PELVIS) 2-3V RIGHT COMPARISON:  None. FINDINGS: SI joints are non widened. Pubic symphysis and rami appear intact. Limited evaluation of femoral neck, question step-off deformity at the base of the femoral neck IMPRESSION: Questionable step-off deformity/fracture at base of femoral neck. Suggest CT for further evaluation. Electronically Signed   By: Donavan Foil M.D.   On: 08/08/2021 20:22      Subjective: Headache today. She directs my hand to her scalp hematoma.  Discharge Exam: Vitals:   08/12/21 0740 08/12/21 1204  BP: (!) 162/56 (!) 116/47  Pulse: 78 71  Resp: 15 13  Temp: 98.3 F (36.8 C) (!) 97.4 F (36.3 C)  SpO2: 98% 93%   Vitals:   08/12/21 0400 08/12/21 0500 08/12/21 0740 08/12/21 1204  BP: (!) 147/61  (!) 162/56 (!) 116/47  Pulse: 63  78 71  Resp: 17  15 13   Temp: 97.8 F (36.6  C)  98.3 F (36.8 C) (!) 97.4 F (36.3 C)  TempSrc: Axillary  Oral Oral  SpO2: 99%  98% 93%  Weight:  39.7 kg    Height:        General: Pt is alert, awake, not in acute distress HEENT: Right sided occipital mass consistent with known hematoma Cardiovascular: RRR, S1/S2 +, no rubs, no gallops Respiratory: CTA bilaterally, no wheezing, no rhonchi Abdominal: Soft, NT, ND, bowel sounds + Extremities: no edema, no cyanosis    The results of significant diagnostics from this hospitalization (including imaging, microbiology, ancillary and laboratory) are listed below for reference.     Microbiology: Recent Results (from the past 240 hour(s))  Resp Panel by RT-PCR (Flu A&B, Covid) Nasopharyngeal Swab     Status: None   Collection Time: 08/08/21  7:22 PM   Specimen: Nasopharyngeal Swab; Nasopharyngeal(NP) swabs in vial transport medium  Result Value Ref Range Status   SARS Coronavirus 2 by RT PCR NEGATIVE NEGATIVE Final    Comment: (NOTE) SARS-CoV-2 target nucleic acids are NOT DETECTED.  The SARS-CoV-2 RNA is generally detectable in upper  respiratory specimens during the acute phase of infection. The lowest concentration of SARS-CoV-2 viral copies this assay can detect is 138 copies/mL. A negative result does not preclude SARS-Cov-2 infection and should not be used as the sole basis for treatment or other patient management decisions. A negative result may occur with  improper specimen collection/handling, submission of specimen other than nasopharyngeal swab, presence of viral mutation(s) within the areas targeted by this assay, and inadequate number of viral copies(<138 copies/mL). A negative result must be combined with clinical observations, patient history, and epidemiological information. The expected result is Negative.  Fact Sheet for Patients:  EntrepreneurPulse.com.au  Fact Sheet for Healthcare Providers:  IncredibleEmployment.be  This test is no t yet approved or cleared by the [place] FDA and  has been authorized for detection and/or diagnosis of SARS-CoV-2 by FDA under an Emergency Use Authorization (EUA). This EUA will remain  in effect (meaning this test can be used) for the duration of the COVID-19 declaration under Section 564(b)(1) of the Act, 21 U.S.C.section 360bbb-3(b)(1), unless the authorization is terminated  or revoked sooner.       Influenza A by PCR NEGATIVE NEGATIVE Final   Influenza B by PCR NEGATIVE NEGATIVE Final    Comment: (NOTE) The Xpert Xpress SARS-CoV-2/FLU/RSV plus assay is intended as an aid in the diagnosis of influenza from Nasopharyngeal swab specimens and should not be used as a sole basis for treatment. Nasal washings and aspirates are unacceptable for Xpert Xpress SARS-CoV-2/FLU/RSV testing.  Fact Sheet for Patients: EntrepreneurPulse.com.au  Fact Sheet for Healthcare Providers: IncredibleEmployment.be  This test is not yet approved or cleared by the [place] FDA and has been  authorized for detection and/or diagnosis of SARS-CoV-2 by FDA under an Emergency Use Authorization (EUA). This EUA will remain in effect (meaning this test can be used) for the duration of the COVID-19 declaration under Section 564(b)(1) of the Act, 21 U.S.C. section 360bbb-3(b)(1), unless the authorization is terminated or revoked.  Performed at [facility], [place] [address] [place], [place] [address]   MRSA Next Gen by PCR, Nasal     Status: None   Collection Time: 08/09/21  4:19 AM   Specimen: Nasal Mucosa; Nasal Swab  Result Value Ref Range Status   MRSA by PCR Next Gen NOT DETECTED NOT DETECTED Final    Comment: (NOTE) The GeneXpert MRSA Assay (  FDA approved for NASAL specimens only), is one component of a comprehensive MRSA colonization surveillance program. It is not intended to diagnose MRSA infection nor to guide or monitor treatment for MRSA infections. Test performance is not FDA approved in patients less than 44 years old. Performed at [facility], [place] [address], [place], [place] [address]      Labs: BNP (last 3 results) No results for input(s): BNP in the last 8760 hours. Basic Metabolic Panel: Recent Labs  Lab 08/08/21 2354 08/09/21 1100 08/10/21 0900 08/10/21 1500 08/10/21 2313 08/11/21 0851 08/12/21 0105  NA 122*  122*   < > 124* 122* 127* 127* 132*  K 3.0*  --   --  3.8 4.1 3.5 3.1*  CL 81*  --   --  84* 92* 94* 100  CO2 28  --   --  29 23 24 26   GLUCOSE 122*  --   --  108* 81 177* 100*  BUN 22  --   --  22 20 15 23   CREATININE 1.05*  --   --  0.97 1.01* 0.93 1.08*  CALCIUM 9.3  --   --  8.4* 8.3* 8.2* 8.0*  MG 2.0  2.0  --   --   --   --   --   --   PHOS 3.6  --   --   --   --   --   --    < > = values in this interval not displayed.   Liver Function Tests: Recent Labs  Lab 08/08/21 1921  AST 30  ALT 14  ALKPHOS 62  BILITOT 0.6  PROT 6.4*  ALBUMIN 3.4*   No results for input(s): LIPASE, AMYLASE in the last 168 hours. No  results for input(s): AMMONIA in the last 168 hours. CBC: Recent Labs  Lab 08/08/21 1921 08/08/21 2354 08/10/21 0900  WBC 7.9 8.1 7.3  NEUTROABS 5.6  --   --   HGB 9.2* 9.4* 9.1*  HCT 26.6* 26.7* 26.5*  MCV 89.6 88.4 87.2  PLT 242 240 240   Cardiac Enzymes: Recent Labs  Lab 08/08/21 2233  CKTOTAL 117   BNP: Invalid input(s): POCBNP CBG: Recent Labs  Lab 08/11/21 1959 08/11/21 2316 08/12/21 0437 08/12/21 0749 08/12/21 1206  GLUCAP 89 132* 87 132* 152*   D-Dimer No results for input(s): DDIMER in the last 72 hours. Hgb A1c No results for input(s): HGBA1C in the last 72 hours. Lipid Profile No results for input(s): CHOL, HDL, LDLCALC, TRIG, CHOLHDL, LDLDIRECT in the last 72 hours. Thyroid function studies No results for input(s): TSH, T4TOTAL, T3FREE, THYROIDAB in the last 72 hours.  Invalid input(s): FREET3 Anemia work up No results for input(s): VITAMINB12, FOLATE, FERRITIN, TIBC, IRON, RETICCTPCT in the last 72 hours. Urinalysis    Component Value Date/Time   COLORURINE STRAW (A) 08/09/2021 0127   APPEARANCEUR CLEAR 08/09/2021 0127   LABSPEC 1.006 08/09/2021 0127   PHURINE 7.0 08/09/2021 0127   GLUCOSEU NEGATIVE 08/09/2021 0127   HGBUR SMALL (A) 08/09/2021 0127   BILIRUBINUR NEGATIVE 08/09/2021 0127   BILIRUBINUR negative 11/08/2018 1136   BILIRUBINUR neg 10/16/2013 1122   KETONESUR NEGATIVE 08/09/2021 0127   PROTEINUR NEGATIVE 08/09/2021 0127   UROBILINOGEN 0.2 11/08/2018 1136   UROBILINOGEN 0.2 06/01/2013 1135   NITRITE NEGATIVE 08/09/2021 0127   LEUKOCYTESUR MODERATE (A) 08/09/2021 0127   Sepsis Labs Invalid input(s): PROCALCITONIN,  WBC,  LACTICIDVEN Microbiology Recent Results (from the past 240 hour(s))  Resp  Panel by RT-PCR (Flu A&B, Covid) Nasopharyngeal Swab     Status: None   Collection Time: 08/08/21  7:22 PM   Specimen: Nasopharyngeal Swab; Nasopharyngeal(NP) swabs in vial transport medium  Result Value Ref Range Status   SARS  Coronavirus 2 by RT PCR NEGATIVE NEGATIVE Final    Comment: (NOTE) SARS-CoV-2 target nucleic acids are NOT DETECTED.  The SARS-CoV-2 RNA is generally detectable in upper respiratory specimens during the acute phase of infection. The lowest concentration of SARS-CoV-2 viral copies this assay can detect is 138 copies/mL. A negative result does not preclude SARS-Cov-2 infection and should not be used as the sole basis for treatment or other patient management decisions. A negative result may occur with  improper specimen collection/handling, submission of specimen other than nasopharyngeal swab, presence of viral mutation(s) within the areas targeted by this assay, and inadequate number of viral copies(<138 copies/mL). A negative result must be combined with clinical observations, patient history, and epidemiological information. The expected result is Negative.  Fact Sheet for Patients:  EntrepreneurPulse.com.au  Fact Sheet for Healthcare Providers:  IncredibleEmployment.be  This test is no t yet approved or cleared by the [place] FDA and  has been authorized for detection and/or diagnosis of SARS-CoV-2 by FDA under an Emergency Use Authorization (EUA). This EUA will remain  in effect (meaning this test can be used) for the duration of the COVID-19 declaration under Section 564(b)(1) of the Act, 21 U.S.C.section 360bbb-3(b)(1), unless the authorization is terminated  or revoked sooner.       Influenza A by PCR NEGATIVE NEGATIVE Final   Influenza B by PCR NEGATIVE NEGATIVE Final    Comment: (NOTE) The Xpert Xpress SARS-CoV-2/FLU/RSV plus assay is intended as an aid in the diagnosis of influenza from Nasopharyngeal swab specimens and should not be used as a sole basis for treatment. Nasal washings and aspirates are unacceptable for Xpert Xpress SARS-CoV-2/FLU/RSV testing.  Fact Sheet for  Patients: EntrepreneurPulse.com.au  Fact Sheet for Healthcare Providers: IncredibleEmployment.be  This test is not yet approved or cleared by the [place] FDA and has been authorized for detection and/or diagnosis of SARS-CoV-2 by FDA under an Emergency Use Authorization (EUA). This EUA will remain in effect (meaning this test can be used) for the duration of the COVID-19 declaration under Section 564(b)(1) of the Act, 21 U.S.C. section 360bbb-3(b)(1), unless the authorization is terminated or revoked.  Performed at [facility], [place] [address], [place], [place] [address]   MRSA Next Gen by PCR, Nasal     Status: None   Collection Time: 08/09/21  4:19 AM   Specimen: Nasal Mucosa; Nasal Swab  Result Value Ref Range Status   MRSA by PCR Next Gen NOT DETECTED NOT DETECTED Final    Comment: (NOTE) The GeneXpert MRSA Assay (FDA approved for NASAL specimens only), is one component of a comprehensive MRSA colonization surveillance program. It is not intended to diagnose MRSA infection nor to guide or monitor treatment for MRSA infections. Test performance is not FDA approved in patients less than 74 years old. Performed at [facility], [place] [address] [place], [place] [address]      Time coordinating discharge: 35 minutes  SIGNED:   Cordelia Poche, MD [facility] 08/12/2021, 2:34 PM

## 2021-08-12 NOTE — TOC Progression Note (Signed)
Transition of Care [facility]) - Progression Note    Patient Details  Name: Robin Andrews MRN: [id number] Date of Birth: [birth year]  Transition of Care [facility]) CM/SW Contact  Erin Sons, [place] Phone Number: 08/12/2021, 10:31 AM  Clinical Narrative:     CSW called pt grandson Cloretta Ned. Explained that pt is refusing SNF. Cloretta Ned explains that he and his wife help her some but they both work and otherwise she is home alone. CSW provides Cloretta Ned with information about how to get PCS services through pt's PCP using her medicaid. CSW explains that [facility] will work on arranging [facility] PT/OT.   Expected Discharge Plan: Home w Home Health Services Barriers to Discharge: Continued Medical Work up  Expected Discharge Plan and Services Expected Discharge Plan: Home w Home Health Services       Living arrangements for the past 2 months: Single Family Home                                       Social Determinants of Health (SDOH) Interventions    Readmission Risk Interventions No flowsheet data found.

## 2021-08-12 NOTE — TOC Progression Note (Signed)
Transition of Care [facility]) - Progression Note    Patient Details  Name: Robin Andrews MRN: [id number] Date of Birth: July 04, 1931  Transition of Care [facility]) CM/SW Contact  Robin Foley Jacklynn Ganong, RN Phone Number: 08/12/2021, 11:40 AM  Clinical Narrative: Case ,manager spoke with patient's Robin Andrews [phone], concerning discharge needs. Referral for Home Health was called to [facility]. RW and 3in1 have been ordered, will be delivered to patient's room. TOC Team will continue to follow.    Expected Discharge Plan: Home w Home Health Services Barriers to Discharge: Continued Medical Work up  Expected Discharge Plan and Services Expected Discharge Plan: Home w Home Health Services       Living arrangements for the past 2 months: Single Family Home                 DME Arranged: 3-N-1, Walker rolling DME Agency: [employer] Date DME Agency Contacted: 08/12/21 Time DME Agency Contacted: 1134 Representative spoke with at DME Agency: Robin Andrews HH Arranged: PT, OT HH Agency: [facility] Date [facility] Agency Contacted: 08/12/21 Time HH Agency Contacted: 1138 Representative spoke with at [facility] Agency: Robin Andrews   Social Determinants of Health (SDOH) Interventions    Readmission Risk Interventions No flowsheet data found.

## 2021-08-12 NOTE — Discharge Instructions (Signed)
Robin Andrews,  You were in the hospital after a fall and subsequent head bleed. You were monitored closely but your bleed is stable. You also had low sodium which improved with IV fluids. Please follow-up with your primary care physician.

## 2021-08-12 NOTE — Progress Notes (Signed)
Discussed d/c instructions with pt and grandson using interpreter services, pt verbalizes understanding, able to ambulate to [facility], IV removed, belongings given back to pt and grandson. No distress noted.

## 2021-09-05 ENCOUNTER — Encounter (HOSPITAL_COMMUNITY): Payer: Self-pay

## 2021-09-05 ENCOUNTER — Emergency Department (HOSPITAL_COMMUNITY): Payer: Medicare Other

## 2021-09-05 ENCOUNTER — Emergency Department (HOSPITAL_COMMUNITY)
Admission: EM | Admit: 2021-09-05 | Discharge: 2021-09-05 | Disposition: A | Discharge status: Home / Self Care | Payer: Medicare Other | Attending: Emergency Medicine | Admitting: Emergency Medicine

## 2021-09-05 DIAGNOSIS — R42 Dizziness and giddiness: Secondary | ICD-10-CM | POA: Insufficient documentation

## 2021-09-05 DIAGNOSIS — R519 Headache, unspecified: Secondary | ICD-10-CM | POA: Diagnosis not present

## 2021-09-05 DIAGNOSIS — I10 Essential (primary) hypertension: Secondary | ICD-10-CM | POA: Insufficient documentation

## 2021-09-05 DIAGNOSIS — Z79899 Other long term (current) drug therapy: Secondary | ICD-10-CM | POA: Diagnosis not present

## 2021-09-05 DIAGNOSIS — R791 Abnormal coagulation profile: Secondary | ICD-10-CM | POA: Insufficient documentation

## 2021-09-05 DIAGNOSIS — Y900 Blood alcohol level of less than 20 mg/100 ml: Secondary | ICD-10-CM | POA: Insufficient documentation

## 2021-09-05 DIAGNOSIS — E119 Type 2 diabetes mellitus without complications: Secondary | ICD-10-CM | POA: Insufficient documentation

## 2021-09-05 LAB — CBC
HCT: 30.7 % — ABNORMAL LOW (ref 36.0–46.0)
Hemoglobin: 10.1 g/dL — ABNORMAL LOW (ref 12.0–15.0)
MCH: 30.2 pg (ref 26.0–34.0)
MCHC: 32.9 g/dL (ref 30.0–36.0)
MCV: 91.9 fL (ref 80.0–100.0)
Platelets: 292 10*3/uL (ref 150–400)
RBC: 3.34 MIL/uL — ABNORMAL LOW (ref 3.87–5.11)
RDW: 12.2 % (ref 11.5–15.5)
WBC: 7 10*3/uL (ref 4.0–10.5)
nRBC: 0 % (ref 0.0–0.2)

## 2021-09-05 LAB — URINALYSIS, ROUTINE W REFLEX MICROSCOPIC
Bacteria, UA: NONE SEEN
Bilirubin Urine: NEGATIVE
Glucose, UA: NEGATIVE mg/dL
Hgb urine dipstick: NEGATIVE
Ketones, ur: NEGATIVE mg/dL
Leukocytes,Ua: NEGATIVE
Nitrite: NEGATIVE
Protein, ur: 30 mg/dL — AB
Specific Gravity, Urine: 1.008 (ref 1.005–1.030)
pH: 8 (ref 5.0–8.0)

## 2021-09-05 LAB — COMPREHENSIVE METABOLIC PANEL
ALT: 12 U/L (ref 0–44)
AST: 29 U/L (ref 15–41)
Albumin: 3.7 g/dL (ref 3.5–5.0)
Alkaline Phosphatase: 80 U/L (ref 38–126)
Anion gap: 8 (ref 5–15)
BUN: 16 mg/dL (ref 8–23)
CO2: 28 mmol/L (ref 22–32)
Calcium: 9.1 mg/dL (ref 8.9–10.3)
Chloride: 101 mmol/L (ref 98–111)
Creatinine, Ser: 0.89 mg/dL (ref 0.44–1.00)
GFR, Estimated: >60 mL/min (ref >60)
Glucose, Bld: 102 mg/dL — ABNORMAL HIGH (ref 70–99)
Potassium: 3.8 mmol/L (ref 3.5–5.1)
Sodium: 137 mmol/L (ref 135–145)
Total Bilirubin: 0.9 mg/dL (ref 0.3–1.2)
Total Protein: 7 g/dL (ref 6.5–8.1)

## 2021-09-05 LAB — RAPID URINE DRUG SCREEN, HOSP PERFORMED
Amphetamines: NOT DETECTED
Barbiturates: NOT DETECTED
Benzodiazepines: NOT DETECTED
Cocaine: NOT DETECTED
Opiates: NOT DETECTED
Tetrahydrocannabinol: NOT DETECTED

## 2021-09-05 LAB — DIFFERENTIAL
Abs Immature Granulocytes: 0.03 10*3/uL (ref 0.00–0.07)
Basophils Absolute: 0.1 10*3/uL (ref 0.0–0.1)
Basophils Relative: 1 %
Eosinophils Absolute: 0.4 10*3/uL (ref 0.0–0.5)
Eosinophils Relative: 5 %
Immature Granulocytes: 0 %
Lymphocytes Relative: 18 %
Lymphs Abs: 1.3 10*3/uL (ref 0.7–4.0)
Monocytes Absolute: 0.6 10*3/uL (ref 0.1–1.0)
Monocytes Relative: 8 %
Neutro Abs: 4.7 10*3/uL (ref 1.7–7.7)
Neutrophils Relative %: 68 %

## 2021-09-05 LAB — PROTIME-INR
INR: 1 (ref 0.8–1.2)
Prothrombin Time: 12.6 seconds (ref 11.4–15.2)

## 2021-09-05 LAB — ETHANOL: Alcohol, Ethyl (B): <10 mg/dL (ref <10)

## 2021-09-05 LAB — APTT: aPTT: 29 seconds (ref 24–36)

## 2021-09-05 MED ORDER — MECLIZINE HCL 25 MG PO TABS
25.0000 mg | ORAL_TABLET | Freq: Once | ORAL | Status: AC
  Administered 2021-09-05: 25 mg via ORAL
  Filled 2021-09-05: qty 1

## 2021-09-05 NOTE — ED Notes (Signed)
Pt ambulatory to bathroom with assistance.

## 2021-09-05 NOTE — ED Notes (Addendum)
[facility] paged at this time. [facility] unable to give ETA for pick-up.

## 2021-09-05 NOTE — ED Triage Notes (Signed)
Pt BIB [facility] from home due to family calling EMS. Interpreter used and found that pt is here to to dizziness that started at 6pm. Pt states the dizziness has resolved but she endorses having a 5/10 HA that has occurred x36mo since falling and hitting her head. Pt denies N/V, vision changes, CP, or SHOB. Pt is A&Ox4. Pt has hx of low Na+ & K+.

## 2021-09-05 NOTE — ED Notes (Signed)
This nurse spoke with pt's husband. Per pt's husband he is unable to pick the pt up from the ED. This nurse to page [facility] for transportation.

## 2021-09-05 NOTE — ED Notes (Signed)
Pt back from MRI 

## 2021-09-05 NOTE — ED Provider Notes (Signed)
Seen by Dr Bebe Shaggy initially.  Please see his note. Plan is to follow up on MRI. MRI does not show any signs of acute stroke.    Linwood Dibbles, MD 09/05/21 1000

## 2021-09-05 NOTE — ED Notes (Signed)
Pt ambulatory to and from restroom with minimal stand-by assist. Pt re-attached to cardiac monitor x2. VSS.

## 2021-09-05 NOTE — ED Provider Notes (Signed)
[place] DEPT Provider Note   CSN: [id number] Arrival date & time: 09/05/21  0222     History Chief Complaint  Patient presents with   Dizziness    Robin Andrews is a 87 y.o. female.  The history is provided by the patient. The history is limited by a language barrier. A language interpreter was used (vietnamese zoe [phone]).  Dizziness Severity:  Moderate Onset quality:  Sudden Duration:  9 hours Timing:  Constant Progression:  Worsening Chronicity:  New Context comment:  Occurred while eating dinner Relieved by:  Nothing Exacerbated by: walking. Associated symptoms: headaches   Associated symptoms: no vomiting and no weakness   She reports leaning to the right after the dizziness that started at 6pm on 09/04/21 No falls in the past month She had to lean against the wall to get into bed No new meds    Past Medical History:  Diagnosis Date   Degenerative disc disease    Diabetes mellitus without complication ([place])    Hypertension    Pruritus     Patient Active Problem List   Diagnosis Date Noted   Hematoma of occipital region of scalp 08/11/2021   SDH (subdural hematoma) ([place]) 08/08/2021   Hyponatremia 62/95/2841   Acute metabolic encephalopathy 32/44/0102   Vertigo 04/18/2020   Noninfectious otitis externa of right ear 04/18/2020   Chronic neck pain 11/09/2019   Chronic bilateral low back pain without sciatica 11/09/2019   Hypokalemia    Diabetes mellitus type 2, controlled, without complications ([place]) 72/53/6644   Cardiac murmur 12/18/2017   Cervical disc disease 05/25/2012   Chronic pain syndrome 05/25/2012   Osteoarthritis 05/25/2012   Hyperlipidemia 05/30/2007   HYPERTENSION, BENIGN ESSENTIAL 05/30/2007   ASTEATOTIC ECZEMA 05/30/2007    Past Surgical History:  Procedure Laterality Date   ANTERIOR CERVICAL DECOMP/DISCECTOMY FUSION  02/2010   C3-6/notes 03/09/2010   BACK SURGERY     DILATION AND CURETTAGE, DIAGNOSTIC  / THERAPEUTIC       OB History   No obstetric history on file.     Family History  Family history unknown: Yes    Social History   Tobacco Use   Smoking status: Never   Smokeless tobacco: Never  Vaping Use   Vaping Use: Never used  Substance Use Topics   Alcohol use: No   Drug use: No    Home Medications Prior to Admission medications   Medication Sig Start Date End Date Taking? Authorizing Provider  amLODipine (NORVASC) 5 MG tablet Take 1 tablet (5 mg total) by mouth daily. TAKE 1 TABLET(10 MG) BY MOUTH DAILY 08/12/21 11/10/21  Mariel Aloe, MD  Blood Pressure Monitoring (BLOOD PRESSURE KIT) DEVI 1 Device by Does not apply route daily. 10/17/18   Tenna Delaine D, PA-C  docusate sodium (COLACE) 100 MG capsule Take 1 capsule (100 mg total) by mouth 2 (two) times daily as needed for mild constipation. 08/12/21   Mariel Aloe, MD  feeding supplement (ENSURE ENLIVE / ENSURE PLUS) LIQD Take 237 mLs by mouth 2 (two) times daily between meals. 08/13/21   Mariel Aloe, MD  HYDROcodone-acetaminophen (NORCO/VICODIN) 5-325 MG tablet Take 1-2 tablets by mouth every 6 (six) hours as needed for moderate pain or severe pain (Headache). 08/12/21   Mariel Aloe, MD  Multiple Vitamin (MULTIVITAMIN WITH MINERALS) TABS tablet Take 1 tablet by mouth daily. 08/13/21   Mariel Aloe, MD  polyethylene glycol powder (GLYCOLAX/MIRALAX) 17 GM/SCOOP powder Take 1 capful (17  g) by mouth daily as needed for moderate constipation. 08/12/21   Mariel Aloe, MD    Allergies    Hydrochlorothiazide and Lisinopril  Review of Systems   Review of Systems  Constitutional:  Negative for fever.  Gastrointestinal:  Negative for vomiting.  Neurological:  Positive for dizziness and headaches. Negative for weakness.  All other systems reviewed and are negative.  Physical Exam Updated Vital Signs BP (!) 176/78   Pulse 71   Temp 98.7 F (37.1 C) (Oral)   Resp 14   Ht 1.6 m (5' 3" )   Wt 39.5 kg    SpO2 100%   BMI 15.41 kg/m   Physical Exam CONSTITUTIONAL: elderly, no distress HEAD: Normocephalic/atraumatic EYES: EOMI, no nystagmus,  no ptosis ENMT: Mucous membranes moist NECK: supple no meningeal signs CV: S1/S2 noted, no murmurs/rubs/gallops noted LUNGS: Lungs are clear to auscultation bilaterally, no apparent distress ABDOMEN: soft, nontender, no rebound or guarding GU:no cva tenderness NEURO:Awake/alert, face symmetric, no arm or leg drift is noted Equal 5/5 strength with shoulder abduction, elbow flex/extension, Equal 5/5 strength with hip flexion,knee flex/extension, foot dorsi/plantar flexion Cranial nerves 3/4/5/6/06/28/09/11/12 tested and intact EXTREMITIES: pulses normal, full ROM, no tenderness noted SKIN: warm, color normal PSYCH: no abnormalities of mood noted  ED Results / Procedures / Treatments   Labs (all labs ordered are listed, but only abnormal results are displayed) Labs Reviewed  CBC - Abnormal; Notable for the following components:      Result Value   RBC 3.34 (*)    Hemoglobin 10.1 (*)    HCT 30.7 (*)    All other components within normal limits  COMPREHENSIVE METABOLIC PANEL - Abnormal; Notable for the following components:   Glucose, Bld 102 (*)    All other components within normal limits  ETHANOL  PROTIME-INR  APTT  DIFFERENTIAL  RAPID URINE DRUG SCREEN, HOSP PERFORMED  URINALYSIS, ROUTINE W REFLEX MICROSCOPIC    EKG EKG Interpretation  Date/Time:  Friday September 05 2021 04:38:57 EDT Ventricular Rate:  68 PR Interval:  160 QRS Duration: 82 QT Interval:  381 QTC Calculation: 406 R Axis:   56 Text Interpretation: Sinus rhythm Borderline T wave abnormalities No significant change since last tracing Confirmed by Ripley Fraise [phone]) on 09/05/2021 5:11:34 AM  Radiology CT HEAD WO CONTRAST  Result Date: 09/05/2021 CLINICAL DATA:  Nonspecific dizziness. EXAM: CT HEAD WITHOUT CONTRAST TECHNIQUE: Contiguous axial images were  obtained from the base of the skull through the vertex without intravenous contrast. COMPARISON:  08/08/2021 FINDINGS: Brain: No evidence of acute infarction, hemorrhage, hydrocephalus, extra-axial collection or mass lesion/mass effect. Cerebral volume loss and chronic small vessel ischemia in keeping with age. Vascular: No hyperdense vessel or unexpected calcification. Skull: Diminished posterior scalp hematoma.  No acute fracture Sinuses/Orbits: Bilateral cataract resection IMPRESSION: Aging brain without acute or reversible finding. Electronically Signed   By: Jorje Guild M.D.   On: 09/05/2021 05:31    Procedures Procedures   Medications Ordered in ED Medications  meclizine (ANTIVERT) tablet 25 mg (has no administration in time range)    ED Course  I have reviewed the triage vital signs and the nursing notes.  Pertinent labs & imaging results that were available during my care of the patient were reviewed by me and considered in my medical decision making (see chart for details).    MDM Rules/Calculators/A&P  04:13am Patient presents after sudden onset of dizziness around 6 PM yesterday.  She reports she had difficulty walking and was leaning to the right.  On my assessment she has no focal neurodeficit Patient has previous history of metabolic abnormalities/hyponatremia as well as subdural hematoma. CT imaging and labs has been ordered 6:33 AM Work-up was overall unremarkable.  Patient is able to ambulate but still reports dizziness. CT head was personally reviewed and is negative.  No significant lab abnormalities. However due to persistent dizziness, will proceed with MRI brain to r/o stroke and await urinalysis 7:13 AM Signed out to Dr. Tomi Bamberger at shift change to f/u on MRI and urinalysis Final Clinical Impression(s) / ED Diagnoses Final diagnoses:  Dizziness    Rx / DC Orders ED Discharge Orders     None        Ripley Fraise,  MD 09/05/21 [phone]

## 2021-09-05 NOTE — ED Notes (Signed)
Pt in MRI.

## 2021-09-05 NOTE — ED Notes (Signed)
Patient transported to MRI 

## 2021-09-05 NOTE — ED Notes (Signed)
Interpreter: Micah Noel [phone]  Meds per paperwork by EMS: Voltaren Flonase  Triamcinolone Acetonide 0.025% Amlodipine 5mg  BID Tylenol 650mg 

## 2022-12-17 ENCOUNTER — Ambulatory Visit (HOSPITAL_COMMUNITY)
Admission: EM | Admit: 2022-12-17 | Discharge: 2022-12-17 | Disposition: A | Discharge status: Home / Self Care | Payer: Medicare Other | Attending: Internal Medicine | Admitting: Internal Medicine

## 2022-12-17 ENCOUNTER — Encounter (HOSPITAL_COMMUNITY): Payer: Self-pay | Admitting: Emergency Medicine

## 2022-12-17 DIAGNOSIS — E0842 Diabetes mellitus due to underlying condition with diabetic polyneuropathy: Secondary | ICD-10-CM

## 2022-12-17 DIAGNOSIS — B351 Tinea unguium: Secondary | ICD-10-CM | POA: Diagnosis not present

## 2022-12-17 MED ORDER — GABAPENTIN 100 MG PO CAPS: 100.0000 mg | ORAL_CAPSULE | Freq: Every day | ORAL | 0 refills | Status: DC

## 2022-12-17 NOTE — Discharge Instructions (Addendum)
Please take medications as prescribed Follow-up with the podiatrist to have your toenails trimmed. Please apply lotion on your legs-this will help with the dryness, scaling of the skin and itchiness. If you have worsening symptoms please return to urgent care to be reevaluated.

## 2022-12-17 NOTE — ED Triage Notes (Signed)
For 5 months having bilat foot and leg swelling. Leg pain goes up to back. Pt also c/o itching.

## 2022-12-18 NOTE — ED Provider Notes (Addendum)
[facility]    CSN: [id number] Arrival date & time: 12/17/22  1428      History   Chief Complaint No chief complaint on file.   HPI Robin Andrews is a 88 y.o. female comes to the urgent care with a 5-month history of bilateral leg pain and swelling.  Pain is burning in nature and associated with itching.  Patient denies any redness.  No lower back pain.  No weakness in the lower extremities.  No rash on the legs.  Patient is a diabetic.  Patient is a poor historian and there was significant language barriers even with use of her translator..   HPI  Past Medical History:  Diagnosis Date   Degenerative disc disease    Diabetes mellitus without complication ([facility])    Hypertension    Pruritus     Patient Active Problem List   Diagnosis Date Noted   Hematoma of occipital region of scalp 08/11/2021   SDH (subdural hematoma) ([facility]) 08/08/2021   Hyponatremia 01/29/2021   Acute metabolic encephalopathy 01/29/2021   Vertigo 04/18/2020   Noninfectious otitis externa of right ear 04/18/2020   Chronic neck pain 11/09/2019   Chronic bilateral low back pain without sciatica 11/09/2019   Hypokalemia    Diabetes mellitus type 2, controlled, without complications ([facility]) 12/18/2017   Cardiac murmur 12/18/2017   Cervical disc disease 05/25/2012   Chronic pain syndrome 05/25/2012   Osteoarthritis 05/25/2012   Hyperlipidemia 05/30/2007   HYPERTENSION, BENIGN ESSENTIAL 05/30/2007   ASTEATOTIC ECZEMA 05/30/2007    Past Surgical History:  Procedure Laterality Date   ANTERIOR CERVICAL DECOMP/DISCECTOMY FUSION  02/2010   C3-6/notes 03/09/2010   BACK SURGERY     DILATION AND CURETTAGE, DIAGNOSTIC / THERAPEUTIC      OB History   No obstetric history on file.      Home Medications    Prior to Admission medications   Medication Sig Start Date End Date Taking? Authorizing Provider  gabapentin (NEURONTIN) 100 MG capsule Take 1 capsule (100 mg total) by mouth at bedtime.  12/17/22  Yes Lamptey, Philip O, MD  Blood Pressure Monitoring (BLOOD PRESSURE KIT) DEVI 1 Device by Does not apply route daily. 10/17/18   Wiseman, Brittany D, PA-C  Multiple Vitamin (MULTIVITAMIN WITH MINERALS) TABS tablet Take 1 tablet by mouth daily. 08/13/21   Nettey, Ralph A, MD    Family History Family History  Family history unknown: Yes    Social History Social History   Tobacco Use   Smoking status: Never   Smokeless tobacco: Never  Vaping Use   Vaping Use: Never used  Substance Use Topics   Alcohol use: No   Drug use: No     Allergies   Hydrochlorothiazide and Lisinopril   Review of Systems Review of Systems Limited secondary to poor historian  Physical Exam Triage Vital Signs ED Triage Vitals [12/17/22 1740]  Enc Vitals Group     BP (!) 174/74     Pulse Rate 88     Resp (!) 26     Temp 97.8 F (36.6 C)     Temp Source Oral     SpO2 95 %     Weight      Height      Head Circumference      Peak Flow      Pain Score      Pain Loc      Pain Edu?      Excl. in GC?      No data found.  Updated Vital Signs BP (!) 174/74 (BP Location: Right Arm)   Pulse 88   Temp 97.8 F (36.6 C) (Oral)   Resp (!) 26   SpO2 95%   Visual Acuity Right Eye Distance:   Left Eye Distance:   Bilateral Distance:    Right Eye Near:   Left Eye Near:    Bilateral Near:     Physical Exam Constitutional:      General: She is not in acute distress.    Appearance: Normal appearance. She is not ill-appearing.  Cardiovascular:     Rate and Rhythm: Normal rate and regular rhythm.  Musculoskeletal:        General: No swelling, tenderness, deformity or signs of injury. Normal range of motion.  Skin:    General: Skin is warm.     Coloration: Skin is not jaundiced or pale.     Findings: No bruising or erythema.     Comments: Dry scaly skin.  No erythema.  Fungal toenail infection bilaterally  Neurological:     Mental Status: She is alert.      UC Treatments /  Results  Labs (all labs ordered are listed, but only abnormal results are displayed) Labs Reviewed - No data to display  EKG   Radiology No results found.  Procedures Procedures (including critical care time)  Medications Ordered in UC Medications - No data to display  Initial Impression / Assessment and Plan / UC Course  I have reviewed the triage vital signs and the nursing notes.  Pertinent labs & imaging results that were available during my care of the patient were reviewed by me and considered in my medical decision making (see chart for details).     1.  Leg pain likely diabetic polyneuropathy: Gabapentin 100 mg nightly Patient is advised to follow-up primary care physician Moisturizer use on lower extremities will help with patient's symptoms. Return precautions given  2.  Fungal infection of both great toe nails: Patient is advised to follow-up with podiatry for management. Final Clinical Impressions(s) / UC Diagnoses   Final diagnoses:  Diabetic polyneuropathy associated with diabetes mellitus due to underlying condition ([place])  Fungal toenail infection     Discharge Instructions      Please take medications as prescribed Follow-up with the podiatrist to have your toenails trimmed. Please apply lotion on your legs-this will help with the dryness, scaling of the skin and itchiness. If you have worsening symptoms please return to urgent care to be reevaluated.   ED Prescriptions     Medication Sig Dispense Auth. Provider   gabapentin (NEURONTIN) 100 MG capsule Take 1 capsule (100 mg total) by mouth at bedtime. 30 capsule Zelpha Messing, Myrene Galas, MD      PDMP not reviewed this encounter.   Chase Picket, MD 12/18/22 1140    Chase Picket, MD 12/18/22 1140    Chase Picket, MD 12/18/22 [phone]

## 2022-12-28 ENCOUNTER — Encounter: Payer: Self-pay | Admitting: Podiatry

## 2022-12-28 ENCOUNTER — Ambulatory Visit (INDEPENDENT_AMBULATORY_CARE_PROVIDER_SITE_OTHER): Payer: Medicare Other | Admitting: Podiatry

## 2022-12-28 DIAGNOSIS — L600 Ingrowing nail: Secondary | ICD-10-CM | POA: Diagnosis not present

## 2022-12-28 NOTE — Progress Notes (Signed)
Subjective:   Patient ID: Robin Andrews, female   DOB: 89 y.o.   MRN: [id number]   HPI Patient presents with caregiver interpreter with severely thickened painful big toenails of both feet that she states get very sore and are making it increasingly hard to wear shoe gear with.  States that she cannot trim them cannot do anything with them and she would like to have them removed through interpreter.  Patient does not smoke tries to be active and appears to have some pain in her groin that she is seeing her doctor for   Review of Systems  All other systems reviewed and are negative.       Objective:  Physical Exam Vitals and nursing note reviewed.  Constitutional:      Appearance: She is well-developed.  Pulmonary:     Effort: Pulmonary effort is normal.  Musculoskeletal:        General: Normal range of motion.  Skin:    General: Skin is warm.  Neurological:     Mental Status: She is alert.     Neurovascular status was found to be intact with adequate PT DP pulses bilateral with good digital perfusion noted bilateral.  Patient is found to have severe thickness dystrophic changes and pain with the hallux toenails of both feet that make it hard for her to wear shoe gear with any degree of comfort.  Patient has no other issues at this time and is motivated to have these nails removed permanently     Assessment:  Chronic damage bilateral hallux with pain with severe thickness and dystrophic changes of the bed     Plan:  H&P all conditions reviewed through interpreter.  At this point due to the intense sense of discomfort I have recommended permanent removal of the nailbeds and I did explain procedure risk and patient would like to have this done.  I did review with her the procedure and patient after reviewing consent form with her caregiver signs understanding risk.  Today I infiltrated each big toe 60 mg Xylocaine Marcaine mixture sterile prep done each big toe and using sterile  instrumentation remove the hallux nails exposed matrix applied phenol for applications 30 seconds followed by alcohol lavage sterile dressing gave instructions on soaks leave dressing on 24 hours take them off earlier if throbbing were to occur.  Encouraged to call questions concerns which may arise

## 2023-06-15 ENCOUNTER — Inpatient Hospital Stay (HOSPITAL_COMMUNITY)
Admission: EM | Admit: 2023-06-15 | Discharge: 2023-06-21 | Disposition: A | Discharge status: Skilled Nursing Facility | Payer: 59 | Attending: Internal Medicine | Admitting: Internal Medicine

## 2023-06-15 ENCOUNTER — Emergency Department (HOSPITAL_COMMUNITY): Payer: 59

## 2023-06-15 ENCOUNTER — Observation Stay (HOSPITAL_COMMUNITY): Payer: 59

## 2023-06-15 ENCOUNTER — Other Ambulatory Visit: Payer: Self-pay

## 2023-06-15 DIAGNOSIS — E872 Acidosis, unspecified: Secondary | ICD-10-CM | POA: Diagnosis present

## 2023-06-15 DIAGNOSIS — D509 Iron deficiency anemia, unspecified: Secondary | ICD-10-CM | POA: Diagnosis present

## 2023-06-15 DIAGNOSIS — I509 Heart failure, unspecified: Secondary | ICD-10-CM

## 2023-06-15 DIAGNOSIS — I5021 Acute systolic (congestive) heart failure: Secondary | ICD-10-CM

## 2023-06-15 DIAGNOSIS — E871 Hypo-osmolality and hyponatremia: Secondary | ICD-10-CM | POA: Diagnosis present

## 2023-06-15 DIAGNOSIS — G894 Chronic pain syndrome: Secondary | ICD-10-CM | POA: Diagnosis present

## 2023-06-15 DIAGNOSIS — E785 Hyperlipidemia, unspecified: Secondary | ICD-10-CM | POA: Diagnosis not present

## 2023-06-15 DIAGNOSIS — I10 Essential (primary) hypertension: Secondary | ICD-10-CM | POA: Diagnosis not present

## 2023-06-15 DIAGNOSIS — Z981 Arthrodesis status: Secondary | ICD-10-CM

## 2023-06-15 DIAGNOSIS — E119 Type 2 diabetes mellitus without complications: Secondary | ICD-10-CM

## 2023-06-15 DIAGNOSIS — T461X5A Adverse effect of calcium-channel blockers, initial encounter: Secondary | ICD-10-CM | POA: Diagnosis present

## 2023-06-15 DIAGNOSIS — E860 Dehydration: Secondary | ICD-10-CM | POA: Diagnosis present

## 2023-06-15 DIAGNOSIS — Z888 Allergy status to other drugs, medicaments and biological substances status: Secondary | ICD-10-CM

## 2023-06-15 DIAGNOSIS — R7989 Other specified abnormal findings of blood chemistry: Secondary | ICD-10-CM

## 2023-06-15 DIAGNOSIS — N179 Acute kidney failure, unspecified: Secondary | ICD-10-CM | POA: Diagnosis not present

## 2023-06-15 DIAGNOSIS — K8020 Calculus of gallbladder without cholecystitis without obstruction: Secondary | ICD-10-CM | POA: Diagnosis present

## 2023-06-15 DIAGNOSIS — D638 Anemia in other chronic diseases classified elsewhere: Secondary | ICD-10-CM | POA: Diagnosis present

## 2023-06-15 DIAGNOSIS — E1165 Type 2 diabetes mellitus with hyperglycemia: Secondary | ICD-10-CM | POA: Diagnosis present

## 2023-06-15 DIAGNOSIS — G9341 Metabolic encephalopathy: Secondary | ICD-10-CM | POA: Diagnosis present

## 2023-06-15 DIAGNOSIS — Z9181 History of falling: Secondary | ICD-10-CM

## 2023-06-15 DIAGNOSIS — Z79899 Other long term (current) drug therapy: Secondary | ICD-10-CM

## 2023-06-15 DIAGNOSIS — I081 Rheumatic disorders of both mitral and tricuspid valves: Secondary | ICD-10-CM | POA: Diagnosis present

## 2023-06-15 DIAGNOSIS — E1169 Type 2 diabetes mellitus with other specified complication: Secondary | ICD-10-CM | POA: Insufficient documentation

## 2023-06-15 DIAGNOSIS — R001 Bradycardia, unspecified: Secondary | ICD-10-CM | POA: Diagnosis present

## 2023-06-15 DIAGNOSIS — D649 Anemia, unspecified: Secondary | ICD-10-CM

## 2023-06-15 LAB — POCT I-STAT, CHEM 8
BUN: 65 mg/dL — ABNORMAL HIGH (ref 8–23)
Calcium, Ion: 1.01 mmol/L — ABNORMAL LOW (ref 1.15–1.40)
Chloride: 101 mmol/L (ref 98–111)
Creatinine, Ser: 2.9 mg/dL — ABNORMAL HIGH (ref 0.44–1.00)
Glucose, Bld: 265 mg/dL — ABNORMAL HIGH (ref 70–99)
HCT: 30 % — ABNORMAL LOW (ref 36.0–46.0)
Hemoglobin: 10.2 g/dL — ABNORMAL LOW (ref 12.0–15.0)
Potassium: 3.7 mmol/L (ref 3.5–5.1)
Sodium: 129 mmol/L — ABNORMAL LOW (ref 135–145)
TCO2: 16 mmol/L — ABNORMAL LOW (ref 22–32)

## 2023-06-15 LAB — CBC WITH DIFFERENTIAL/PLATELET
Abs Immature Granulocytes: 0.03 10*3/uL (ref 0.00–0.07)
Basophils Absolute: 0 10*3/uL (ref 0.0–0.1)
Basophils Relative: 1 %
Eosinophils Absolute: 0.1 10*3/uL (ref 0.0–0.5)
Eosinophils Relative: 1 %
HCT: 27 % — ABNORMAL LOW (ref 36.0–46.0)
Hemoglobin: 8.7 g/dL — ABNORMAL LOW (ref 12.0–15.0)
Immature Granulocytes: 0 %
Lymphocytes Relative: 9 %
Lymphs Abs: 0.7 10*3/uL (ref 0.7–4.0)
MCH: 28.6 pg (ref 26.0–34.0)
MCHC: 32.2 g/dL (ref 30.0–36.0)
MCV: 88.8 fL (ref 80.0–100.0)
Monocytes Absolute: 0.6 10*3/uL (ref 0.1–1.0)
Monocytes Relative: 8 %
Neutro Abs: 6.2 10*3/uL (ref 1.7–7.7)
Neutrophils Relative %: 81 %
Platelets: 354 10*3/uL (ref 150–400)
RBC: 3.04 MIL/uL — ABNORMAL LOW (ref 3.87–5.11)
RDW: 13.4 % (ref 11.5–15.5)
WBC: 7.6 10*3/uL (ref 4.0–10.5)
nRBC: 0 % (ref 0.0–0.2)

## 2023-06-15 LAB — POCT I-STAT EG7
Acid-base deficit: 7 mmol/L — ABNORMAL HIGH (ref 0.0–2.0)
Bicarbonate: 15.4 mmol/L — ABNORMAL LOW (ref 20.0–28.0)
Calcium, Ion: 0.98 mmol/L — ABNORMAL LOW (ref 1.15–1.40)
HCT: 27 % — ABNORMAL LOW (ref 36.0–46.0)
Hemoglobin: 9.2 g/dL — ABNORMAL LOW (ref 12.0–15.0)
O2 Saturation: 99 %
Potassium: 3.8 mmol/L (ref 3.5–5.1)
Sodium: 128 mmol/L — ABNORMAL LOW (ref 135–145)
TCO2: 16 mmol/L — ABNORMAL LOW (ref 22–32)
pCO2, Ven: 22.6 mmHg — ABNORMAL LOW (ref 44–60)
pH, Ven: 7.44 — ABNORMAL HIGH (ref 7.25–7.43)
pO2, Ven: 115 mmHg — ABNORMAL HIGH (ref 32–45)

## 2023-06-15 LAB — ECHOCARDIOGRAM COMPLETE
AR max vel: 1.26 cm2
AV Area VTI: 1.43 cm2
AV Area mean vel: 1.37 cm2
AV Mean grad: 7 mmHg
AV Peak grad: 15.4 mmHg
Ao pk vel: 1.97 m/s
Area-P 1/2: 3.51 cm2
Calc EF: 75.1 %
Height: 63 in
MV VTI: 1.24 cm2
S' Lateral: 2.4 cm
Single Plane A2C EF: 74.5 %
Single Plane A4C EF: 76.1 %
Weight: 1410.94 oz

## 2023-06-15 LAB — URINALYSIS, W/ REFLEX TO CULTURE (INFECTION SUSPECTED)
Bacteria, UA: NONE SEEN
Bilirubin Urine: NEGATIVE
Glucose, UA: NEGATIVE mg/dL
Hgb urine dipstick: NEGATIVE
Ketones, ur: NEGATIVE mg/dL
Leukocytes,Ua: NEGATIVE
Nitrite: NEGATIVE
Protein, ur: NEGATIVE mg/dL
Specific Gravity, Urine: 1.016 (ref 1.005–1.030)
pH: 5 (ref 5.0–8.0)

## 2023-06-15 LAB — COMPREHENSIVE METABOLIC PANEL
ALT: 23 U/L (ref 0–44)
AST: 37 U/L (ref 15–41)
Albumin: 3.4 g/dL — ABNORMAL LOW (ref 3.5–5.0)
Alkaline Phosphatase: 97 U/L (ref 38–126)
Anion gap: 17 — ABNORMAL HIGH (ref 5–15)
BUN: 68 mg/dL — ABNORMAL HIGH (ref 8–23)
CO2: 15 mmol/L — ABNORMAL LOW (ref 22–32)
Calcium: 7.8 mg/dL — ABNORMAL LOW (ref 8.9–10.3)
Chloride: 96 mmol/L — ABNORMAL LOW (ref 98–111)
Creatinine, Ser: 2.87 mg/dL — ABNORMAL HIGH (ref 0.44–1.00)
GFR, Estimated: 15 mL/min — ABNORMAL LOW (ref >60)
Glucose, Bld: 262 mg/dL — ABNORMAL HIGH (ref 70–99)
Potassium: 4 mmol/L (ref 3.5–5.1)
Sodium: 128 mmol/L — ABNORMAL LOW (ref 135–145)
Total Bilirubin: 0.8 mg/dL (ref 0.3–1.2)
Total Protein: 6.5 g/dL (ref 6.5–8.1)

## 2023-06-15 LAB — TROPONIN I (HIGH SENSITIVITY)
Troponin I (High Sensitivity): 13 ng/L (ref <18)
Troponin I (High Sensitivity): 18 ng/L — ABNORMAL HIGH (ref <18)

## 2023-06-15 LAB — PROTIME-INR
INR: 1.1 (ref 0.8–1.2)
Prothrombin Time: 14.8 seconds (ref 11.4–15.2)

## 2023-06-15 LAB — LACTIC ACID, PLASMA
Lactic Acid, Venous: 2.4 mmol/L (ref 0.5–1.9)
Lactic Acid, Venous: 1.6 mmol/L (ref 0.5–1.9)

## 2023-06-15 LAB — BRAIN NATRIURETIC PEPTIDE: B Natriuretic Peptide: 1495.1 pg/mL — ABNORMAL HIGH (ref 0.0–100.0)

## 2023-06-15 LAB — MAGNESIUM: Magnesium: 3.1 mg/dL — ABNORMAL HIGH (ref 1.7–2.4)

## 2023-06-15 MED ORDER — SODIUM CHLORIDE 0.9 % IV SOLN: INTRAVENOUS | Status: AC

## 2023-06-15 MED ORDER — SODIUM CHLORIDE 0.9 % IV SOLN
1.0000 g | Freq: Once | INTRAVENOUS | Status: DC
  Filled 2023-06-15: qty 10

## 2023-06-15 MED ORDER — SODIUM CHLORIDE 0.9% FLUSH
3.0000 mL | Freq: Two times a day (BID) | INTRAVENOUS | Status: DC
  Administered 2023-06-15 – 2023-06-19 (×7): 3 mL via INTRAVENOUS

## 2023-06-15 MED ORDER — HEPARIN SODIUM (PORCINE) 5000 UNIT/ML IJ SOLN
5000.0000 [IU] | Freq: Three times a day (TID) | INTRAMUSCULAR | Status: DC
  Administered 2023-06-15 – 2023-06-18 (×9): 5000 [IU] via SUBCUTANEOUS
  Filled 2023-06-15 (×9): qty 1

## 2023-06-15 MED ORDER — POLYETHYLENE GLYCOL 3350 17 G PO PACK: 17.0000 g | PACK | Freq: Every day | ORAL | Status: DC | PRN

## 2023-06-15 MED ORDER — LACTATED RINGERS IV BOLUS (SEPSIS)
500.0000 mL | Freq: Once | INTRAVENOUS | Status: AC
  Administered 2023-06-15: 500 mL via INTRAVENOUS

## 2023-06-15 MED ORDER — ACETAMINOPHEN 650 MG RE SUPP: 650.0000 mg | Freq: Four times a day (QID) | RECTAL | Status: DC | PRN

## 2023-06-15 MED ORDER — ACETAMINOPHEN 325 MG PO TABS
650.0000 mg | ORAL_TABLET | Freq: Four times a day (QID) | ORAL | Status: DC | PRN
  Administered 2023-06-16 – 2023-06-20 (×5): 650 mg via ORAL
  Filled 2023-06-15 (×7): qty 2

## 2023-06-15 MED ORDER — SODIUM CHLORIDE 0.9 % IV SOLN
500.0000 mg | Freq: Once | INTRAVENOUS | Status: DC
  Filled 2023-06-15: qty 5

## 2023-06-15 NOTE — Consult Note (Addendum)
Cardiology Consultation   Patient ID: TAMECA JEREZ MRN: [id number]; DOB: [birth year]  Admit date: 06/15/2023 Date of Consult: 06/15/2023  PCP:  Pearson Grippe, MD   [facility] Providers Cardiologist:  New to [facility]     Patient Profile:   MELBA ARAKI is a 89 y.o. female with a hx of hypertension, DM2, CKD, hyponatremia, SDH and history of falls who is being seen 06/15/2023 for the evaluation of elevated BNP and possible heart failure at the request of Dr. Alinda Money.  History of Present Illness:   Ms. Vanderweide is a 89 year old female with past medical history of hypertension, DM2, CKD, hyponatremia, SDH and history of falls.  She has never seen by cardiologist service before.  Communication has been difficulty given with help of venovenous translator. (TranslatorCasimiro Needle # [id number]) She was admitted in August 2022 with a fall.  On arrival to the hospital, she was severely hyponatremic with sodium of 115.  She also had parafalcine subdural hematoma seen on CT of the head.  Neurosurgery service was consulted.  There was scalp hematoma and also questionable right femoral neck fracture as well.  Hyponatremia was felt to be due to combination of poor oral intake, TBI and SSRI usage.  There has been no prior cardiac workup.  According to the patient, her nephew and niece sometimes stop by to help her wash dishes.  Otherwise she lives alone and do the cooking and care for herself.  She says her health has been declining for the past 2 weeks.  She has been having worsening weakness, nausea and vomiting.  She denies any chest pain or shortness of breath.  She feels cold and weak, but no fever or abdominal pain.  In the past 3 days, she has not been able to keep anything down due to nausea and vomiting.  She stayed at home because she was weak and fear she was going to fall when she goes outside.  She is extremely hungry at this time and keep asking for food during our interview.  She says her niece  and nephew went to the beach and has not been able to help her for the past few days.  She was able to call 911 at the help of her church friend.  When EMS arrived today, patient was ambulatory.  His systolic blood pressure was low in the 90s and she was bradycardic.  She was given 200 cc of IV fluid.  EMS found medication at home include amlodipine, ibuprofen and Tylenol.   On arrival to [facility], systolic blood pressure improved to 120s.  Heart rate was still in the 50s.  She was mildly tachypneic.  Patient was given 500 cc of IV fluid.  Significant blood work on arrival include sodium 128, creatinine 2.87, calcium 7.8, BNP 1495, magnesium 3.1, albumin 3.4, lactic acid 2.4, hemoglobin 8.7.  Blood cultures currently pending.  Patient denies any recent fever.  Urinalysis negative.  Chest x-ray showed cardiomegaly with hazy opacities over both lower lobes likely reflecting layering pleural fluid and adjacent atelectasis.  Echocardiogram obtained in the emergency room showed EF 65 to 70%, normal RV, no regional wall motion abnormality, moderate TR, mild MR.    Past Medical History:  Diagnosis Date   Degenerative disc disease    Diabetes mellitus without complication (HCC)    Hypertension    Pruritus     Past Surgical History:  Procedure Laterality Date   ANTERIOR CERVICAL DECOMP/DISCECTOMY FUSION  02/2010  C3-6/notes 03/09/2010   BACK SURGERY     DILATION AND CURETTAGE, DIAGNOSTIC / THERAPEUTIC       Home Medications:  Prior to Admission medications   Medication Sig Start Date End Date Taking? Authorizing Provider  Blood Pressure Monitoring (BLOOD PRESSURE KIT) DEVI 1 Device by Does not apply route daily. 10/17/18   Benjiman Core D, PA-C  gabapentin (NEURONTIN) 100 MG capsule Take 1 capsule (100 mg total) by mouth at bedtime. 12/17/22   LampteyBritta Mccreedy, MD  Multiple Vitamin (MULTIVITAMIN WITH MINERALS) TABS tablet Take 1 tablet by mouth daily. 08/13/21   Narda Bonds, MD     Inpatient Medications: Scheduled Meds:  heparin  5,000 Units Subcutaneous Q8H   sodium chloride flush  3 mL Intravenous Q12H   Continuous Infusions:  PRN Meds: acetaminophen **OR** acetaminophen, polyethylene glycol  Allergies:    Allergies  Allergen Reactions   Hydrochlorothiazide     Severe hyponatremia resulting in hospitalization   Lisinopril     Social History:   Social History   Socioeconomic History   Marital status: Widowed    Spouse name: Not on file   Number of children: 1   Years of education: Not on file   Highest education level: Not on file  Occupational History   Not on file  Tobacco Use   Smoking status: Never   Smokeless tobacco: Never  Vaping Use   Vaping Use: Never used  Substance and Sexual Activity   Alcohol use: No   Drug use: No   Sexual activity: Not Currently    Birth control/protection: Abstinence  Other Topics Concern   Not on file  Social History Narrative   Not on file   Social Determinants of Health   Financial Resource Strain: Not on file  Food Insecurity: Not on file  Transportation Needs: Not on file  Physical Activity: Not on file  Stress: Not on file  Social Connections: Not on file  Intimate Partner Violence: Not on file    Family History:    Family History  Family history unknown: Yes     ROS:  Please see the history of present illness.   All other ROS reviewed and negative.     Physical Exam/Data:   Vitals:   06/15/23 1231 06/15/23 1233 06/15/23 1651  BP:  (!) 127/47 (!) 133/59  Pulse:  (!) 58 (!) 55  Resp:  (!) 22 18  Temp:  97.6 F (36.4 C) 98.2 F (36.8 C)  TempSrc:  Axillary Oral  SpO2:  100% 90%  Weight: 40 kg    Height: 5\' 3"  (1.6 m)     No intake or output data in the 24 hours ending 06/15/23 1707    06/15/2023   12:31 PM 09/05/2021    2:47 AM 08/12/2021    5:00 AM  Last 3 Weights  Weight (lbs) 88 lb 2.9 oz 87 lb 87 lb 8.4 oz  Weight (kg) 40 kg 39.463 kg 39.7 kg     Body mass  index is 15.62 kg/m.  General:  Well nourished, well developed, in no acute distress HEENT: normal Neck: no JVD Vascular: No carotid bruits; Distal pulses 2+ bilaterally Cardiac:  normal S1, S2; RRR; no murmur  Lungs:  clear to auscultation bilaterally, no wheezing, rhonchi or rales  Abd: soft, nontender, no hepatomegaly  Ext: no edema Musculoskeletal:  No deformities, BUE and BLE strength normal and equal Skin: warm and dry  Neuro:  CNs 2-12 intact, no focal abnormalities noted  Psych:  Normal affect   EKG:  The EKG was personally reviewed and demonstrates:  Pending EKG Telemetry:  Telemetry was personally reviewed and demonstrates: Telemetry from the emergency room was not available to me.  She is currently in sinus bradycardia with heart rate in the 50s on 3 E.  Relevant CV Studies:  Echo 06/15/2023  1. Left ventricular ejection fraction, by estimation, is 65 to 70%. The  left ventricle has normal function. The left ventricle has no regional  wall motion abnormalities. Left ventricular diastolic parameters are  indeterminate.   2. Right ventricular systolic function is normal. The right ventricular  size is normal.   3. The mitral valve is normal in structure. Mild mitral valve  regurgitation. No evidence of mitral stenosis.   4. Tricuspid valve regurgitation is moderate.   5. The aortic valve is normal in structure. Aortic valve regurgitation is  not visualized. Aortic valve sclerosis/calcification is present, without  any evidence of aortic stenosis.   6. The inferior vena cava is normal in size with greater than 50%  respiratory variability, suggesting right atrial pressure of 3 mmHg.   Laboratory Data:  High Sensitivity Troponin:   Recent Labs  Lab 06/15/23 1248  TROPONINIHS 13     Chemistry Recent Labs  Lab 06/15/23 1248  NA 128*  K 4.0  CL 96*  CO2 15*  GLUCOSE 262*  BUN 68*  CREATININE 2.87*  CALCIUM 7.8*  MG 3.1*  GFRNONAA 15*  ANIONGAP 17*     Recent Labs  Lab 06/15/23 1248  PROT 6.5  ALBUMIN 3.4*  AST 37  ALT 23  ALKPHOS 97  BILITOT 0.8   Lipids No results for input(s): "CHOL", "TRIG", "HDL", "LABVLDL", "LDLCALC", "CHOLHDL" in the last 168 hours.  Hematology Recent Labs  Lab 06/15/23 1248  WBC 7.6  RBC 3.04*  HGB 8.7*  HCT 27.0*  MCV 88.8  MCH 28.6  MCHC 32.2  RDW 13.4  PLT 354   Thyroid No results for input(s): "TSH", "FREET4" in the last 168 hours.  BNP Recent Labs  Lab 06/15/23 1248  BNP 1,495.1*    DDimer No results for input(s): "DDIMER" in the last 168 hours.   Radiology/Studies:  ECHOCARDIOGRAM COMPLETE  Result Date: 06/15/2023    ECHOCARDIOGRAM REPORT   Patient Name:   MA MUNOZ [facility] Date of Exam: 06/15/2023 Medical Rec #:  [id number]    Height:       63.0 in Accession #:    [id number]   Weight:       88.2 lb Date of Birth:  12-31-1930     BSA:          1.365 m Patient Age:    89 years     BP:           127/47 mmHg Patient Gender: F            HR:           55 bpm. Exam Location:  Inpatient Procedure: 2D Echo, Cardiac Doppler and Color Doppler STAT ECHO Indications:    Congestive Heart Failure  History:        Patient has no prior history of Echocardiogram examinations.                 Arrythmias:Bradycardia, Signs/Symptoms:Fatigue; Risk                 Factors:Dyslipidemia, Hypertension and Diabetes.  Sonographer:    Wallie Char Referring Phys: [id number] Cecille Po MELVIN  Sonographer Comments: Messaged MD @ 3:23pm IMPRESSIONS  1. Left ventricular ejection fraction, by estimation, is 65 to 70%. The left ventricle has normal function. The left ventricle has no regional wall motion abnormalities. Left ventricular diastolic parameters are indeterminate.  2. Right ventricular systolic function is normal. The right ventricular size is normal.  3. The mitral valve is normal in structure. Mild mitral valve regurgitation. No evidence of mitral stenosis.  4. Tricuspid valve regurgitation is moderate.  5. The aortic  valve is normal in structure. Aortic valve regurgitation is not visualized. Aortic valve sclerosis/calcification is present, without any evidence of aortic stenosis.  6. The inferior vena cava is normal in size with greater than 50% respiratory variability, suggesting right atrial pressure of 3 mmHg. FINDINGS  Left Ventricle: Left ventricular ejection fraction, by estimation, is 65 to 70%. The left ventricle has normal function. The left ventricle has no regional wall motion abnormalities. The left ventricular internal cavity size was normal in size. There is  no left ventricular hypertrophy. Left ventricular diastolic parameters are indeterminate. Right Ventricle: The right ventricular size is normal. No increase in right ventricular wall thickness. Right ventricular systolic function is normal. Left Atrium: Left atrial size was normal in size. Right Atrium: Right atrial size was normal in size. Pericardium: There is no evidence of pericardial effusion. Mitral Valve: The mitral valve is normal in structure. Mild mitral annular calcification. Mild mitral valve regurgitation. No evidence of mitral valve stenosis. MV peak gradient, 6.0 mmHg. The mean mitral valve gradient is 2.0 mmHg. Tricuspid Valve: The tricuspid valve is normal in structure. Tricuspid valve regurgitation is moderate . No evidence of tricuspid stenosis. Aortic Valve: The aortic valve is normal in structure. Aortic valve regurgitation is not visualized. Aortic valve sclerosis/calcification is present, without any evidence of aortic stenosis. Aortic valve mean gradient measures 7.0 mmHg. Aortic valve peak  gradient measures 15.4 mmHg. Aortic valve area, by VTI measures 1.43 cm. Pulmonic Valve: The pulmonic valve was normal in structure. Pulmonic valve regurgitation is not visualized. No evidence of pulmonic stenosis. Aorta: The aortic root is normal in size and structure. Venous: The inferior vena cava is normal in size with greater than 50%  respiratory variability, suggesting right atrial pressure of 3 mmHg. IAS/Shunts: No atrial level shunt detected by color flow Doppler.  LEFT VENTRICLE PLAX 2D LVIDd:         4.10 cm     Diastology LVIDs:         2.40 cm     LV e' medial:    6.50 cm/s LV PW:         1.00 cm     LV E/e' medial:  20.9 LV IVS:        0.70 cm     LV e' lateral:   5.88 cm/s LVOT diam:     1.50 cm     LV E/e' lateral: 23.1 LV SV:         58 LV SV Index:   42 LVOT Area:     1.77 cm  LV Volumes (MOD) LV vol d, MOD A2C: 56.5 ml LV vol d, MOD A4C: 57.4 ml LV vol s, MOD A2C: 14.4 ml LV vol s, MOD A4C: 13.7 ml LV SV MOD A2C:     42.1 ml LV SV MOD A4C:     57.4 ml LV SV MOD BP:      42.9 ml RIGHT VENTRICLE  IVC RV Basal diam:  2.90 cm     IVC diam: 2.00 cm RV S prime:     14.30 cm/s TAPSE (M-mode): 1.8 cm LEFT ATRIUM             Index        RIGHT ATRIUM           Index LA diam:        3.40 cm 2.49 cm/m   RA Area:     13.90 cm LA Vol (A2C):   31.7 ml 23.22 ml/m  RA Volume:   31.50 ml  23.07 ml/m LA Vol (A4C):   30.8 ml 22.56 ml/m LA Biplane Vol: 33.7 ml 24.68 ml/m  AORTIC VALVE AV Area (Vmax):    1.26 cm AV Area (Vmean):   1.37 cm AV Area (VTI):     1.43 cm AV Vmax:           196.50 cm/s AV Vmean:          123.500 cm/s AV VTI:            0.405 m AV Peak Grad:      15.4 mmHg AV Mean Grad:      7.0 mmHg LVOT Vmax:         140.00 cm/s LVOT Vmean:        95.800 cm/s LVOT VTI:          0.328 m LVOT/AV VTI ratio: 0.81  AORTA Ao Root diam: 3.00 cm Ao Asc diam:  3.60 cm MITRAL VALVE                TRICUSPID VALVE MV Area (PHT): 3.51 cm     TR Peak grad:   43.0 mmHg MV Area VTI:   1.24 cm     TR Mean grad:   29.0 mmHg MV Peak grad:  6.0 mmHg     TR Vmax:        328.00 cm/s MV Mean grad:  2.0 mmHg     TR Vmean:       257.0 cm/s MV Vmax:       1.22 m/s MV Vmean:      58.0 cm/s    SHUNTS MV Decel Time: 216 msec     Systemic VTI:  0.33 m MV E velocity: 136.00 cm/s  Systemic Diam: 1.50 cm MV A velocity: 102.00 cm/s MV E/A ratio:  1.33  Kardie Tobb DO Electronically signed by Thomasene Ripple DO Signature Date/Time: 06/15/2023/3:57:30 PM    Final    DG Chest Port 1 View  Result Date: 06/15/2023 CLINICAL DATA:  Weakness and feeling cold.  Possible sepsis. EXAM: PORTABLE CHEST 1 VIEW COMPARISON:  Chest radiograph 08/08/2021 FINDINGS: The heart is enlarged, unchanged. The upper mediastinal contours are normal. There is hazy opacity over both lower lobes likely reflecting layering fluid and adjacent atelectasis. The upper lobes are clear. There is no overt pulmonary edema. There is no pneumothorax There is no acute osseous abnormality. IMPRESSION: Cardiomegaly with hazy opacities over both lower lobes likely reflecting layering pleural fluid and adjacent atelectasis. Electronically Signed   By: Lesia Hausen M.D.   On: 06/15/2023 13:40     Assessment and Plan:   Elevated BNP:  -BNP 1495.  Patient does not appears to be volume overloaded on physical exam.  Chest x-ray showed cardiomegaly with hazy opacities over both lower lobes likely reflecting layering pleural fluid and adjacent atelectasis.  On exam, her lung is clear.  She has no  JVD.  -Echocardiogram obtained in the ED showed EF 65 to 70%, no regional wall motion abnormality, moderate TR, mild MR.  -Apparently she has been feeling weak for the past 2 weeks, worse she normally cooks by herself and her niece and nephew will come to help her to wash dishes, however recently her niece and nephew went to the beach.  For the past 3 days, she tries to cook for herself, however due to nausea and vomiting she has not been able to keep anything down.  -During the interview, she requested multiple times for food and saying she is very very hungry.  -I question if the elevated BNP could be a sign of high-output heart failure in the setting of malnourishment.  She does not seem to be significantly volume overloaded on physical exam, I would be in favor of holding off on aggressive diuresis.  AKI:  Creatinine 2.87.  He received 200 cc of of IV fluid by EMS and received another 500 cc of lactated ringer in the emergency room.  Additional IV fluid being held due to chest x-ray showing possible pleural effusion and elevated BNP.  Hypertension: There is reported that she is on amlodipine at home, she says she follows a Dr. Selena Batten however does not know which medication she takes at home.  She says she is on 5 different medications to keep the blood pressure down  DM2  Hyponatremia: Sodium 128.  Suspect due to poor oral intake  Subdural hematoma: Occurred in August 2022 after a fall  History of falls   Risk Assessment/Risk Scores:          For questions or updates, please contact [facility] Please consult [email or web address] for contact info under    Ramond Dial, [place]  06/15/2023 5:07 PM  Patient seen, examined. Available data reviewed. Agree with findings, assessment, and plan as outlined by Azalee Course, PA.  The patient is independently interviewed and examined.  She is a delightful 89 year old woman.  There is a significant language barrier even with use of a Nurse, learning disability.  She has complained of weakness, nausea, and poor appetite.  At the time of my interview with her, she is sitting up in bed eating food.  HEENT is normal, JVP is flat, lungs are clear bilaterally, heart is regular rate and rhythm with a 2/6 ejection murmur best heard along the left sternal border, abdomen is soft, thin, nontender, extremities have no edema.  We are asked to see the patient for elevated BNP.  Her presentation is not consistent with acute heart failure.  In fact, she has acute kidney injury, poor p.o. intake, and appears dry on exam.  She has received some IV fluid today.  Recommend trending her creatinine.  Avoid diuresis.  Her echocardiogram is reviewed and her LVEF is 65 to 70%.  There is no RV dysfunction.  Hopefully her creatinine will improve with fluids and adequate p.o. intake.  No cardiology  follow-up indicated at this time.  Tonny Bollman, M.D. 06/15/2023 5:57 PM

## 2023-06-15 NOTE — H&P (Signed)
History and Physical   HAIDY KACKLEY UJW:[id number] DOB: 02-15-1931 DOA: 06/15/2023  PCP: Pearson Grippe, MD   Patient coming from: Home  Chief Complaint: Weakness, cool  HPI: Robin Andrews is a 89 y.o. female with medical history significant of hypertension, hyperlipidemia, diabetes, SDH, chronic pain presenting with weakness and coolness.  History obtained with assistance of chart review and family due to patient's difficulty with language barrier and difficulty with getting translation through trance later given patient's difficulty and translator difficulty understanding each other.  EMS was called today unclear if by patient or by neighbor.  When EMS arrived patient was ambulatory and gestured to her chest and abdomen.  Noted to have hypotension in the 90s and bradycardia.  Received 200 cc of IV fluids and route.  On medications found in the home amlodipine, ibuprofen, Tylenol.  Patient also reporting leg pain.  Unable to obtain full/accurate ROS 2/2 language barrier.  ED Course: Signs in the ED improved with blood pressure in the 90s to 120 systolic, heart rate in the 50s, respiratory rate in the 20s.  Lab workup included CMP with sodium 128 which improved considering glucose 262, chloride 96, bicarb 15, BUN 60, creatinine elevated to 2.87 from baseline of 1, gap 17, calcium 7.8, albumin 3.4.  CBC with hemoglobin of 8.7 down from baseline a year ago of 9-10.  PT and INR normal.  FOBT pending.  Lactic acid 2.4.  BNP elevated to 1495.  Troponin normal with repeat pending.  Blood cultures pending.  Urinalysis pending.  Magnesium elevated at 3.1.  Chest x-ray with cardiomegaly, hazy opacities bilaterally consistent with pleural fluid and atelectasis.  Patient did receive 500 cc IV fluid in the ED.  Review of Systems: As per HPI otherwise all other systems reviewed and are negative.  Past Medical History:  Diagnosis Date   Degenerative disc disease    Diabetes mellitus without complication (HCC)     Hypertension    Pruritus     Past Surgical History:  Procedure Laterality Date   ANTERIOR CERVICAL DECOMP/DISCECTOMY FUSION  02/2010   C3-6/notes 03/09/2010   BACK SURGERY     DILATION AND CURETTAGE, DIAGNOSTIC / THERAPEUTIC      Social History  reports that she has never smoked. She has never used smokeless tobacco. She reports that she does not drink alcohol and does not use drugs.  Allergies  Allergen Reactions   Hydrochlorothiazide     Severe hyponatremia resulting in hospitalization   Lisinopril     Family History  Family history unknown: Yes  Reviewed on admission  Prior to Admission medications   Medication Sig Start Date End Date Taking? Authorizing Provider  Blood Pressure Monitoring (BLOOD PRESSURE KIT) DEVI 1 Device by Does not apply route daily. 10/17/18   Benjiman Core D, PA-C  gabapentin (NEURONTIN) 100 MG capsule Take 1 capsule (100 mg total) by mouth at bedtime. 12/17/22   LampteyBritta Mccreedy, MD  Multiple Vitamin (MULTIVITAMIN WITH MINERALS) TABS tablet Take 1 tablet by mouth daily. 08/13/21   Narda Bonds, MD    Physical Exam: Vitals:   06/15/23 1231 06/15/23 1233  BP:  (!) 127/47  Pulse:  (!) 58  Resp:  (!) 22  Temp:  97.6 F (36.4 C)  TempSrc:  Axillary  SpO2:  100%  Weight: 40 kg   Height: 5\' 3"  (1.6 m)     Physical Exam Constitutional:      General: She is not in acute distress.  Appearance: Normal appearance.  HENT:     Head: Normocephalic and atraumatic.     Mouth/Throat:     Mouth: Mucous membranes are moist.     Pharynx: Oropharynx is clear.  Eyes:     Extraocular Movements: Extraocular movements intact.     Pupils: Pupils are equal, round, and reactive to light.  Cardiovascular:     Rate and Rhythm: Normal rate and regular rhythm.     Pulses: Normal pulses.     Heart sounds: Normal heart sounds.  Pulmonary:     Effort: Pulmonary effort is normal. No respiratory distress.     Breath sounds: Normal breath sounds.   Abdominal:     General: Bowel sounds are normal. There is no distension.     Palpations: Abdomen is soft.     Tenderness: There is no abdominal tenderness.  Musculoskeletal:        General: No swelling or deformity.  Skin:    General: Skin is warm and dry.  Neurological:     General: No focal deficit present.     Mental Status: Mental status is at baseline.     Labs on Admission: I have personally reviewed following labs and imaging studies  CBC: Recent Labs  Lab 06/15/23 1248  WBC 7.6  NEUTROABS 6.2  HGB 8.7*  HCT 27.0*  MCV 88.8  PLT 354    Basic Metabolic Panel: Recent Labs  Lab 06/15/23 1248  NA 128*  K 4.0  CL 96*  CO2 15*  GLUCOSE 262*  BUN 68*  CREATININE 2.87*  CALCIUM 7.8*  MG 3.1*    GFR: Estimated Creatinine Clearance: 8.2 mL/min (A) (by C-G formula based on SCr of 2.87 mg/dL (H)).  Liver Function Tests: Recent Labs  Lab 06/15/23 1248  AST 37  ALT 23  ALKPHOS 97  BILITOT 0.8  PROT 6.5  ALBUMIN 3.4*    Urine analysis:    Component Value Date/Time   COLORURINE STRAW (A) 09/05/2021 0700   APPEARANCEUR CLEAR 09/05/2021 0700   LABSPEC 1.008 09/05/2021 0700   PHURINE 8.0 09/05/2021 0700   GLUCOSEU NEGATIVE 09/05/2021 0700   HGBUR NEGATIVE 09/05/2021 0700   BILIRUBINUR NEGATIVE 09/05/2021 0700   BILIRUBINUR negative 11/08/2018 1136   BILIRUBINUR neg 10/16/2013 1122   KETONESUR NEGATIVE 09/05/2021 0700   PROTEINUR 30 (A) 09/05/2021 0700   UROBILINOGEN 0.2 11/08/2018 1136   UROBILINOGEN 0.2 06/01/2013 1135   NITRITE NEGATIVE 09/05/2021 0700   LEUKOCYTESUR NEGATIVE 09/05/2021 0700    Radiological Exams on Admission: DG Chest Port 1 View  Result Date: 06/15/2023 CLINICAL DATA:  Weakness and feeling cold.  Possible sepsis. EXAM: PORTABLE CHEST 1 VIEW COMPARISON:  Chest radiograph 08/08/2021 FINDINGS: The heart is enlarged, unchanged. The upper mediastinal contours are normal. There is hazy opacity over both lower lobes likely  reflecting layering fluid and adjacent atelectasis. The upper lobes are clear. There is no overt pulmonary edema. There is no pneumothorax There is no acute osseous abnormality. IMPRESSION: Cardiomegaly with hazy opacities over both lower lobes likely reflecting layering pleural fluid and adjacent atelectasis. Electronically Signed   By: Lesia Hausen M.D.   On: 06/15/2023 13:40    EKG: Either not yet performed or not yet resulted in the ED.  Assessment/Plan Principal Problem:   Acute on chronic systolic (congestive) heart failure (HCC) Active Problems:   Hyperlipidemia   HYPERTENSION, BENIGN ESSENTIAL   Chronic pain syndrome   Diabetes mellitus type 2, controlled, without complications (HCC)   Acute CHF >  Patient presenting with nonspecific complaints of weakness and cold.  Found to have BNP of 1495, AKI with creatinine of 2.87.  Evidence of fluid on chest x-ray. > Given low normal blood pressure and acute nature, some concern for acute low output heart failure. > Magnesium actually high at 3.1. - Cardiology consult  - Appreciate cardiology recommendations - Will hold off on Lasix or fluids pending cardiology recs given low blood pressure on presentation and concern for acute CHF. - Formal echocardiogram, STAT, to guide management - Trend renal function and electrolytes  AKI Hypervolemic hyponatremia > Creatinine elevated to 2.87 from baseline 1.  Sodium 128 which corrects to around 01/20/1930 with glucose of 262.  Otherwise she is not too far from baseline.  Bicarb of 15 and gap of 17 likely secondary  to BUN of 68.  Decreased effective arterial volume could also be playing a role.  Sodium likely low secondary to increased volume. - Trend renal function and electrolytes - Will need to diurese but will wait for cardiology input as above  Hypertension - Hold off on any antihypertensives  Hyperlipidemia Diabetes - Not currently on any medications for this that we can tell  DVT  prophylaxis: Heparin Code Status:   Full at this time.  Family Communication:  EDP spoke with one grandson. I was unable to reach further family by phone.  Disposition Plan:   Patient is from:  Home  Anticipated DC to:  Pending clinical course  Anticipated DC date:  1 to 5 days  Anticipated DC barriers: None  Consults called:  Cardiology Admission status:  Observation, progressive  Severity of Illness: The appropriate patient status for this patient is OBSERVATION. Observation status is judged to be reasonable and necessary in order to provide the required intensity of service to ensure the patient's safety. The patient's presenting symptoms, physical exam findings, and initial radiographic and laboratory data in the context of their medical condition is felt to place them at decreased risk for further clinical deterioration. Furthermore, it is anticipated that the patient will be medically stable for discharge from the hospital within 2 midnights of admission.    Synetta Fail MD [facility]  How to contact the [facility] Attending or Consulting provider 7A - 7P or covering provider during after hours 7P -7A, for this patient?   Check the care team in [facility] and look for a) attending/consulting [facility] provider listed and b) the [facility] team listed Log into [email or web address] and use [place]'s universal password to access. If you do not have the password, please contact the hospital operator. Locate the [facility] provider you are looking for under [facility] and page to a number that you can be directly reached. If you still have difficulty reaching the provider, please page the [facility] (Director on Call) for the Hospitalists listed on amion for assistance.  06/15/2023, 2:35 PM

## 2023-06-15 NOTE — ED Provider Notes (Signed)
[place] EMERGENCY DEPARTMENT AT [facility] Provider Note   CSN: [id number] Arrival date & time: 06/15/23  1214     History  Chief Complaint  Patient presents with   Weakness    Robin Andrews is a 89 y.o. female history of hypertension, type 2 diabetes, subdural hematoma, metabolic cephalopathy presented for feeling cold and tired.  EMS obtain limited history due to language barrier but were told that she was having abdominal pain along with some chest pain.  EMS also states patient was hypotensive and bradycardic with tachypnea.  CBG was elevated in the 200-300s and patient received 500 mL of fluid.  With the patient in the ER a translator was used however patient was unable to fully hear the translator and so history was difficult.  Patient is that her legs hurt but cannot tell where.  EMS did state that patient was walking around when they got there.  Patient is unable to answer if she is having saddle anesthesia or urinary/bowel incontinence.  Patient continually says that she feels cold and weak all over but denies any sick contacts.  ROS was unable to be obtained  Home Medications Prior to Admission medications   Medication Sig Start Date End Date Taking? Authorizing Provider  Blood Pressure Monitoring (BLOOD PRESSURE KIT) DEVI 1 Device by Does not apply route daily. 10/17/18   Benjiman Core D, PA-C  gabapentin (NEURONTIN) 100 MG capsule Take 1 capsule (100 mg total) by mouth at bedtime. 12/17/22   LampteyBritta Mccreedy, MD  Multiple Vitamin (MULTIVITAMIN WITH MINERALS) TABS tablet Take 1 tablet by mouth daily. 08/13/21   Narda Bonds, MD      Allergies    Hydrochlorothiazide and Lisinopril    Review of Systems   Review of Systems  Neurological:  Positive for weakness.    Physical Exam Updated Vital Signs BP (!) 127/47   Pulse (!) 58   Temp 97.6 F (36.4 C) (Axillary)   Resp (!) 22   Ht 5\' 3"  (1.6 m)   Wt 40 kg   SpO2 100%   BMI 15.62 kg/m  Physical  Exam Constitutional:      General: She is not in acute distress.    Appearance: She is not ill-appearing.  Eyes:     Extraocular Movements: Extraocular movements intact.     Conjunctiva/sclera: Conjunctivae normal.     Pupils: Pupils are equal, round, and reactive to light.  Cardiovascular:     Rate and Rhythm: Regular rhythm. Bradycardia present.     Pulses: Normal pulses.     Heart sounds: Normal heart sounds.  Pulmonary:     Breath sounds: Normal breath sounds.     Comments: Tachypnea with increased work of breathing Abdominal:     General: There is no distension.     Palpations: Abdomen is soft. There is no mass.     Tenderness: There is no abdominal tenderness. There is no guarding or rebound.  Musculoskeletal:        General: Normal range of motion.     Cervical back: Normal range of motion.  Skin:    General: Skin is warm and dry.     Capillary Refill: Capillary refill takes 2 to 3 seconds.  Neurological:     Mental Status: She is alert.     Comments: AAO x 1 place     ED Results / Procedures / Treatments   Labs (all labs ordered are listed, but only abnormal results are displayed)  Labs Reviewed  LACTIC ACID, PLASMA - Abnormal; Notable for the following components:      Result Value   Lactic Acid, Venous 2.4 (*)    All other components within normal limits  COMPREHENSIVE METABOLIC PANEL - Abnormal; Notable for the following components:   Sodium 128 (*)    Chloride 96 (*)    CO2 15 (*)    Glucose, Bld 262 (*)    BUN 68 (*)    Creatinine, Ser 2.87 (*)    Calcium 7.8 (*)    Albumin 3.4 (*)    GFR, Estimated 15 (*)    Anion gap 17 (*)    All other components within normal limits  CBC WITH DIFFERENTIAL/PLATELET - Abnormal; Notable for the following components:   RBC 3.04 (*)    Hemoglobin 8.7 (*)    HCT 27.0 (*)    All other components within normal limits  BRAIN NATRIURETIC PEPTIDE - Abnormal; Notable for the following components:   B Natriuretic Peptide  1,495.1 (*)    All other components within normal limits  MAGNESIUM - Abnormal; Notable for the following components:   Magnesium 3.1 (*)    All other components within normal limits  CULTURE, BLOOD (ROUTINE X 2)  CULTURE, BLOOD (ROUTINE X 2)  PROTIME-INR  LACTIC ACID, PLASMA  URINALYSIS, W/ REFLEX TO CULTURE (INFECTION SUSPECTED)  I-STAT CHEM 8, ED  I-STAT VENOUS BLOOD GAS, ED  POC OCCULT BLOOD, ED  TROPONIN I (HIGH SENSITIVITY)  TROPONIN I (HIGH SENSITIVITY)    EKG None  Radiology DG Chest Port 1 View  Result Date: 06/15/2023 CLINICAL DATA:  Weakness and feeling cold.  Possible sepsis. EXAM: PORTABLE CHEST 1 VIEW COMPARISON:  Chest radiograph 08/08/2021 FINDINGS: The heart is enlarged, unchanged. The upper mediastinal contours are normal. There is hazy opacity over both lower lobes likely reflecting layering fluid and adjacent atelectasis. The upper lobes are clear. There is no overt pulmonary edema. There is no pneumothorax There is no acute osseous abnormality. IMPRESSION: Cardiomegaly with hazy opacities over both lower lobes likely reflecting layering pleural fluid and adjacent atelectasis. Electronically Signed   By: Lesia Hausen M.D.   On: 06/15/2023 13:40    Procedures .Critical Care  Performed by: Netta Corrigan, PA-C Authorized by: Netta Corrigan, PA-C   Critical care provider statement:    Critical care time (minutes):  40   Critical care time was exclusive of:  Separately billable procedures and treating other patients   Critical care was necessary to treat or prevent imminent or life-threatening deterioration of the following conditions:  Renal failure and cardiac failure   Critical care was time spent personally by me on the following activities:  Blood draw for specimens, development of treatment plan with patient or surrogate, discussions with consultants, evaluation of patient's response to treatment, examination of patient, obtaining history from patient or  surrogate, review of old charts, re-evaluation of patient's condition, pulse oximetry, ordering and review of radiographic studies, ordering and review of laboratory studies and ordering and performing treatments and interventions   I assumed direction of critical care for this patient from another provider in my specialty: no     Care discussed with: admitting provider       Medications Ordered in ED Medications  lactated ringers bolus 500 mL (500 mLs Intravenous New Bag/Given 06/15/23 1315)    ED Course/ Medical Decision Making/ A&P Clinical Course as of 06/15/23 1431  Tue Jun 15, 2023  1420 B Natriuretic Peptide(!): 1,495.1 [  JS]    Clinical Course User Index [JS] Netta Corrigan, PA-C                             Medical Decision Making Amount and/or Complexity of Data Reviewed Labs: ordered. Radiology: ordered. ECG/medicine tests: ordered.   NYSA SARIN 89 y.o. presented today for sepsis. Working DDx that I considered at this time includes, but not limited to, electrolyte abnormalities, pneumonia, sepsis, critical dehydration, AKI, bacteremia, CHF exacerbation, cirrhosis, arrhythmia, ACS.  R/o DDx: pneumonia, sepsis, critical dehydration, bacteremia, cirrhosis, arrhythmia, ACS: These are considered less likely due to history of present illness and physical exam findings  Review of prior external notes: 12/17/2022 ED  Unique Tests and My Interpretation:  CBC: Hemoglobin 8.7, this is down from last year; the rest unremarkable PT/INR: Negative BNP: Elevated 1495.1 Magnesium: Elevated 3.1 Troponin: pending CMP: Elevated creatinine 2.87, elevated BUN 68, hypocalcemia 7.8, anion gap 17, GFR 15 Lactic acid: 2.4 [id number] Chem-8:pending POC occult blood:pending UA: pending Blood cultures: Pending Chest x-ray: Possible pleural fluid and layering atelectasis in the lower lobes bilaterally EKG: pending  Discussion with Independent Historian: None  Discussion of  Management of Tests:  Alinda Money, MD Hospitalist  Risk: Medium: prescription drug management and High: hospitalization or escalation of hospital-level care  Risk Stratification Score: None  Staffed with Durwin Nora, MD  Plan: On exam patient was septic with bradycardia and tachypnea.  Patient's blood pressure with EMS was 90/30 however in the ER her blood pressure was 127/47.  Patient was given 500 mL lactated Ringer's.  Patient's physical exam did not show any obvious signs of infection however patient was unable to follow commands for a neuro exam or MSK exam.  History is difficult to obtain as patient cannot fully hear the translator but I repeated that she felt cold and fatigued.  Sepsis workup was initiated.  Chest x-ray initially showed possible pneumonia and so antibiotics were ordered.  Pending labs anticipate patient admission.  Patient stable at this time.  Patient checks x-ray was read as having pleural effusions.  Along with patient's elevated BNP suspect patient has CHF exacerbation causing her weakness.  Patient also has AKI and slightly elevated gap possibly due to dehydration elevated sugar.  Pending VBG at this time but unable to give fluids as patient does have elevated BNP.  Fluids were stopped.  Hospitalist was consulted for admission.  Patient stable at this time.  I spoke to the nurses about stopping the fluids and patient had already received 500 mL of fluid at this time.  At this time of the suspicion of infection as patient does not have white count and with the chest x-ray being read as pleural effusions and so antibiotics were stopped at this time as well.  I spoke to the hospitalist and patient was accepted for admission.  While we are talking patient's lactate was 2.4 however due to her BNP being elevated with pleural effusions fluid we will be withheld at this time.  Patient stable for admission at this time.         Final Clinical Impression(s) / ED Diagnoses Final  diagnoses:  AKI (acute kidney injury) (HCC)  Acute congestive heart failure, unspecified heart failure type [facility])    Rx / DC Orders ED Discharge Orders     None         Remi Deter 06/15/23 1431    Gloris Manchester, MD 06/16/23 1004

## 2023-06-15 NOTE — ED Triage Notes (Signed)
Pt bib EMS for complaints of weakness and being very cold.   EMS VS  113/42 54 96% ra 30 RR 293 CBG from finger 317 CBG from IV Using interpreter   Pt complains of high blood pressure and if I stand up I will fall down. Says difficulty standing without feeling like she will fall.  Complains of weakness and leg pain.

## 2023-06-15 NOTE — ED Notes (Signed)
ED TO INPATIENT HANDOFF REPORT  ED Nurse Name and Phone #: Grover Canavan [phone]  S Name/Age/Gender Robin Andrews 89 y.o. female Room/Bed: 007C/007C  Code Status   Code Status: Full Code  Home/SNF/Other Home Patient oriented to: self, place, time, and situation Is this baseline? Yes   Triage Complete: Triage complete  Chief Complaint Acute on chronic systolic (congestive) heart failure (HCC) [I50.23]  Triage Note Pt bib EMS for complaints of weakness and being very cold.   EMS VS  113/42 54 96% ra 30 RR 293 CBG from finger 317 CBG from IV Using interpreter   Pt complains of high blood pressure and if I stand up I will fall down. Says difficulty standing without feeling like she will fall.  Complains of weakness and leg pain.   Allergies Allergies  Allergen Reactions   Hydrochlorothiazide     Severe hyponatremia resulting in hospitalization   Lisinopril     Level of Care/Admitting Diagnosis ED Disposition   ED Disposition: Admit Condition: None Comment: Hospital Area: [facility] [[id number]]  Level of Care: Progressive [102]  Admit to Progressive based on following criteria: CARDIOVASCULAR & THORACIC of moderate stability with acute coronary syndrome symptoms/low risk myocardial infarction/hypertensive urgency/arrhythmias/heart failure potentially compromising stability and stable post cardiovascular intervention patients.  May place patient in observation at [facility] or [facility] if equivalent level of care is available:: No  Covid Evaluation: Asymptomatic - no recent exposure (last 10 days) testing not required  Diagnosis: Acute on chronic systolic (congestive) heart failure [facility]) [[id number]]  Admitting Physician: Synetta Fail [[id number]]  Attending Physician: Synetta Fail [[id number]]      B Medical/Surgery History Past Medical History:  Diagnosis Date   Degenerative disc disease    Diabetes mellitus without complication (HCC)     Hypertension    Pruritus    Past Surgical History:  Procedure Laterality Date   ANTERIOR CERVICAL DECOMP/DISCECTOMY FUSION  02/2010   C3-6/notes 03/09/2010   BACK SURGERY     DILATION AND CURETTAGE, DIAGNOSTIC / THERAPEUTIC       A IV Location/Drains/Wounds Patient Lines/Drains/Airways Status     Active Line/Drains/Airways     Name Placement date Placement time Site Days   Peripheral IV 09/05/21 22 G Anterior;Left Forearm 09/05/21  0445  Forearm  648   Peripheral IV 06/15/23 20 G Left Antecubital 06/15/23  1229  Antecubital  less than 1            Intake/Output Last 24 hours No intake or output data in the 24 hours ending 06/15/23 1528  Labs/Imaging Results for orders placed or performed during the hospital encounter of 06/15/23 (from the past 48 hour(s))  Lactic acid, plasma     Status: Abnormal   Collection Time: 06/15/23 12:48 PM  Result Value Ref Range   Lactic Acid, Venous 2.4 (HH) 0.5 - 1.9 mmol/L    Comment: RESULT CALLED TO, READ BACK BY AND VERIFIED WITH Dalia Heading, RN @1420  [phone] JLASIGAN Performed at [facility], [address], [place], [place] [address]   Comprehensive metabolic panel     Status: Abnormal   Collection Time: 06/15/23 12:48 PM  Result Value Ref Range   Sodium 128 (L) 135 - 145 mmol/L   Potassium 4.0 3.5 - 5.1 mmol/L   Chloride 96 (L) 98 - 111 mmol/L   CO2 15 (L) 22 - 32 mmol/L   Glucose, Bld 262 (H) 70 - 99 mg/dL    Comment: Glucose reference  range applies only to samples taken after fasting for at least 8 hours.   BUN 68 (H) 8 - 23 mg/dL   Creatinine, Ser 1.61 (H) 0.44 - 1.00 mg/dL   Calcium 7.8 (L) 8.9 - 10.3 mg/dL   Total Protein 6.5 6.5 - 8.1 g/dL   Albumin 3.4 (L) 3.5 - 5.0 g/dL   AST 37 15 - 41 U/L   ALT 23 0 - 44 U/L   Alkaline Phosphatase 97 38 - 126 U/L   Total Bilirubin 0.8 0.3 - 1.2 mg/dL   GFR, Estimated 15 (L) >60 mL/min    Comment: (NOTE) Calculated using the CKD-EPI Creatinine Equation (2021)    Anion  gap 17 (H) 5 - 15    Comment: Performed at [facility], [address] [place], [place] [address]  CBC with Differential     Status: Abnormal   Collection Time: 06/15/23 12:48 PM  Result Value Ref Range   WBC 7.6 4.0 - 10.5 K/uL   RBC 3.04 (L) 3.87 - 5.11 MIL/uL   Hemoglobin 8.7 (L) 12.0 - 15.0 g/dL   HCT 54.0 (L) 98.1 - 19.1 %   MCV 88.8 80.0 - 100.0 fL   MCH 28.6 26.0 - 34.0 pg   MCHC 32.2 30.0 - 36.0 g/dL   RDW 47.8 29.5 - 62.1 %   Platelets 354 150 - 400 K/uL   nRBC 0.0 0.0 - 0.2 %   Neutrophils Relative % 81 %   Neutro Abs 6.2 1.7 - 7.7 K/uL   Lymphocytes Relative 9 %   Lymphs Abs 0.7 0.7 - 4.0 K/uL   Monocytes Relative 8 %   Monocytes Absolute 0.6 0.1 - 1.0 K/uL   Eosinophils Relative 1 %   Eosinophils Absolute 0.1 0.0 - 0.5 K/uL   Basophils Relative 1 %   Basophils Absolute 0.0 0.0 - 0.1 K/uL   Immature Granulocytes 0 %   Abs Immature Granulocytes 0.03 0.00 - 0.07 K/uL    Comment: Performed at [facility], [address], [place], [place] [address]  Protime-INR     Status: None   Collection Time: 06/15/23 12:48 PM  Result Value Ref Range   Prothrombin Time 14.8 11.4 - 15.2 seconds   INR 1.1 0.8 - 1.2    Comment: (NOTE) INR goal varies based on device and disease states. Performed at [facility], [address], [place], [place] [address]   Brain natriuretic peptide     Status: Abnormal   Collection Time: 06/15/23 12:48 PM  Result Value Ref Range   B Natriuretic Peptide 1,495.1 (H) 0.0 - 100.0 pg/mL    Comment: Performed at [facility], [address] [place], [place] [address]  Troponin I (High Sensitivity)     Status: None   Collection Time: 06/15/23 12:48 PM  Result Value Ref Range   Troponin I (High Sensitivity) 13 <18 ng/L    Comment: (NOTE) Elevated high sensitivity troponin I (hsTnI) values and significant  changes across serial measurements may suggest ACS but many other  chronic and acute conditions are known to elevate  hsTnI results.  Refer to the "Links" section for chest pain algorithms and additional  guidance. Performed at [facility], [address], [place], [place] [address]   Magnesium     Status: Abnormal   Collection Time: 06/15/23 12:48 PM  Result Value Ref Range   Magnesium 3.1 (H) 1.7 - 2.4 mg/dL    Comment: Performed  at [facility], [address] [place], [place] [address]  Urinalysis, w/ Reflex to Culture (Infection Suspected) -Urine, Clean Catch     Status: None   Collection Time: 06/15/23  2:07 PM  Result Value Ref Range   Specimen Source URINE, CLEAN CATCH    Color, Urine YELLOW YELLOW   APPearance CLEAR CLEAR   Specific Gravity, Urine 1.016 1.005 - 1.030   pH 5.0 5.0 - 8.0   Glucose, UA NEGATIVE NEGATIVE mg/dL   Hgb urine dipstick NEGATIVE NEGATIVE   Bilirubin Urine NEGATIVE NEGATIVE   Ketones, ur NEGATIVE NEGATIVE mg/dL   Protein, ur NEGATIVE NEGATIVE mg/dL   Nitrite NEGATIVE NEGATIVE   Leukocytes,Ua NEGATIVE NEGATIVE   RBC / HPF 0-5 0 - 5 RBC/hpf   WBC, UA 0-5 0 - 5 WBC/hpf    Comment:        Reflex urine culture not performed if WBC <=10, OR if Squamous epithelial cells >5. If Squamous epithelial cells >5 suggest recollection.    Bacteria, UA NONE SEEN NONE SEEN   Squamous Epithelial / HPF 0-5 0 - 5 /HPF   Mucus PRESENT    Hyaline Casts, UA PRESENT     Comment: Performed at [facility], [address], [place], [place] [address]   DG Chest Port 1 View  Result Date: 06/15/2023 CLINICAL DATA:  Weakness and feeling cold.  Possible sepsis. EXAM: PORTABLE CHEST 1 VIEW COMPARISON:  Chest radiograph 08/08/2021 FINDINGS: The heart is enlarged, unchanged. The upper mediastinal contours are normal. There is hazy opacity over both lower lobes likely reflecting layering fluid and adjacent atelectasis. The upper lobes are clear. There is no overt pulmonary edema. There is no pneumothorax There is no acute osseous abnormality. IMPRESSION: Cardiomegaly with  hazy opacities over both lower lobes likely reflecting layering pleural fluid and adjacent atelectasis. Electronically Signed   By: Lesia Hausen M.D.   On: 06/15/2023 13:40    Pending Labs Unresulted Labs (From admission, onward)     Start     Ordered   06/16/23 0500  Comprehensive metabolic panel  Tomorrow morning,   R        06/15/23 1434   06/16/23 0500  CBC  Tomorrow morning,   R        06/15/23 1434   06/16/23 0500  Magnesium  Tomorrow morning,   R        06/15/23 1434   06/15/23 1222  Lactic acid, plasma  (Undifferentiated presentation (screening labs and basic nursing orders))  Now then every 2 hours,   R (with STAT occurrences)      06/15/23 1221   06/15/23 1222  Blood Culture (routine x 2)  (Undifferentiated presentation (screening labs and basic nursing orders))  BLOOD CULTURE X 2,   STAT      06/15/23 1221            Vitals/Pain Today's Vitals   06/15/23 1231 06/15/23 1233  BP:  (Abnormal) 127/47  Pulse:  (Abnormal) 58  Resp:  (Abnormal) 22  Temp:  97.6 F (36.4 C)  TempSrc:  Axillary  SpO2:  100%  Weight: 40 kg   Height: 5\' 3"  (1.6 m)   PainSc: 5      Isolation Precautions No active isolations  Medications Medications  heparin injection 5,000 Units (has no administration in time range)  sodium chloride flush (NS) 0.9 % injection 3 mL (has no administration in time range)  acetaminophen (TYLENOL) tablet 650 mg (has no  administration in time range)    Or  acetaminophen (TYLENOL) suppository 650 mg (has no administration in time range)  polyethylene glycol (MIRALAX / GLYCOLAX) packet 17 g (has no administration in time range)  lactated ringers bolus 500 mL (0 mLs Intravenous Stopped 06/15/23 1420)    Mobility walks with person assist     Focused Assessments Cardiac Assessment Handoff:  Cardiac Rhythm: Sinus bradycardia Lab Results  Component Value Date   CKTOTAL 117 08/08/2021   TROPONINI <0.30 06/01/2013   No results found for: "DDIMER" Does  the Patient currently have chest pain? No   , Pulmonary Assessment Handoff:  Lung sounds: Bilateral Breath Sounds: Fine crackles L Breath Sounds: Fine crackles R Breath Sounds: Fine crackles O2 Device: Nasal Cannula      R Recommendations: See Admitting Provider Note  Report given to:   Additional Notes:  Speaks [place], may understand some english

## 2023-06-16 ENCOUNTER — Inpatient Hospital Stay (HOSPITAL_COMMUNITY): Payer: 59

## 2023-06-16 DIAGNOSIS — E119 Type 2 diabetes mellitus without complications: Secondary | ICD-10-CM | POA: Diagnosis not present

## 2023-06-16 DIAGNOSIS — E785 Hyperlipidemia, unspecified: Secondary | ICD-10-CM | POA: Diagnosis present

## 2023-06-16 DIAGNOSIS — D649 Anemia, unspecified: Secondary | ICD-10-CM

## 2023-06-16 DIAGNOSIS — E860 Dehydration: Secondary | ICD-10-CM | POA: Diagnosis present

## 2023-06-16 DIAGNOSIS — I10 Essential (primary) hypertension: Secondary | ICD-10-CM | POA: Diagnosis present

## 2023-06-16 DIAGNOSIS — I5021 Acute systolic (congestive) heart failure: Secondary | ICD-10-CM | POA: Diagnosis present

## 2023-06-16 DIAGNOSIS — E872 Acidosis, unspecified: Secondary | ICD-10-CM | POA: Diagnosis present

## 2023-06-16 DIAGNOSIS — G894 Chronic pain syndrome: Secondary | ICD-10-CM | POA: Diagnosis present

## 2023-06-16 DIAGNOSIS — E1165 Type 2 diabetes mellitus with hyperglycemia: Secondary | ICD-10-CM | POA: Diagnosis present

## 2023-06-16 DIAGNOSIS — Z9181 History of falling: Secondary | ICD-10-CM | POA: Diagnosis not present

## 2023-06-16 DIAGNOSIS — I081 Rheumatic disorders of both mitral and tricuspid valves: Secondary | ICD-10-CM | POA: Diagnosis present

## 2023-06-16 DIAGNOSIS — D638 Anemia in other chronic diseases classified elsewhere: Secondary | ICD-10-CM | POA: Diagnosis present

## 2023-06-16 DIAGNOSIS — Z981 Arthrodesis status: Secondary | ICD-10-CM | POA: Diagnosis not present

## 2023-06-16 DIAGNOSIS — N179 Acute kidney failure, unspecified: Secondary | ICD-10-CM

## 2023-06-16 DIAGNOSIS — E871 Hypo-osmolality and hyponatremia: Secondary | ICD-10-CM | POA: Diagnosis present

## 2023-06-16 DIAGNOSIS — R001 Bradycardia, unspecified: Secondary | ICD-10-CM | POA: Diagnosis present

## 2023-06-16 DIAGNOSIS — E1169 Type 2 diabetes mellitus with other specified complication: Secondary | ICD-10-CM | POA: Diagnosis not present

## 2023-06-16 DIAGNOSIS — Z79899 Other long term (current) drug therapy: Secondary | ICD-10-CM | POA: Diagnosis not present

## 2023-06-16 DIAGNOSIS — D509 Iron deficiency anemia, unspecified: Secondary | ICD-10-CM | POA: Insufficient documentation

## 2023-06-16 DIAGNOSIS — G9341 Metabolic encephalopathy: Secondary | ICD-10-CM | POA: Diagnosis present

## 2023-06-16 DIAGNOSIS — T461X5A Adverse effect of calcium-channel blockers, initial encounter: Secondary | ICD-10-CM | POA: Diagnosis present

## 2023-06-16 DIAGNOSIS — K8020 Calculus of gallbladder without cholecystitis without obstruction: Secondary | ICD-10-CM | POA: Diagnosis present

## 2023-06-16 DIAGNOSIS — Z888 Allergy status to other drugs, medicaments and biological substances status: Secondary | ICD-10-CM | POA: Diagnosis not present

## 2023-06-16 LAB — CBC
HCT: 26.7 % — ABNORMAL LOW (ref 36.0–46.0)
Hemoglobin: 8.7 g/dL — ABNORMAL LOW (ref 12.0–15.0)
MCH: 27.5 pg (ref 26.0–34.0)
MCHC: 32.6 g/dL (ref 30.0–36.0)
MCV: 84.5 fL (ref 80.0–100.0)
Platelets: 366 10*3/uL (ref 150–400)
RBC: 3.16 MIL/uL — ABNORMAL LOW (ref 3.87–5.11)
RDW: 13.4 % (ref 11.5–15.5)
WBC: 10.2 10*3/uL (ref 4.0–10.5)
nRBC: 0 % (ref 0.0–0.2)

## 2023-06-16 LAB — COMPREHENSIVE METABOLIC PANEL
ALT: 25 U/L (ref 0–44)
AST: 32 U/L (ref 15–41)
Albumin: 3.4 g/dL — ABNORMAL LOW (ref 3.5–5.0)
Alkaline Phosphatase: 103 U/L (ref 38–126)
Anion gap: 12 (ref 5–15)
BUN: 64 mg/dL — ABNORMAL HIGH (ref 8–23)
CO2: 16 mmol/L — ABNORMAL LOW (ref 22–32)
Calcium: 7.8 mg/dL — ABNORMAL LOW (ref 8.9–10.3)
Chloride: 99 mmol/L (ref 98–111)
Creatinine, Ser: 2.94 mg/dL — ABNORMAL HIGH (ref 0.44–1.00)
GFR, Estimated: 15 mL/min — ABNORMAL LOW (ref >60)
Glucose, Bld: 178 mg/dL — ABNORMAL HIGH (ref 70–99)
Potassium: 3.9 mmol/L (ref 3.5–5.1)
Sodium: 127 mmol/L — ABNORMAL LOW (ref 135–145)
Total Bilirubin: 0.5 mg/dL (ref 0.3–1.2)
Total Protein: 6.6 g/dL (ref 6.5–8.1)

## 2023-06-16 LAB — GLUCOSE, CAPILLARY
Glucose-Capillary: 139 mg/dL — ABNORMAL HIGH (ref 70–99)
Glucose-Capillary: 66 mg/dL — ABNORMAL LOW (ref 70–99)
Glucose-Capillary: 108 mg/dL — ABNORMAL HIGH (ref 70–99)

## 2023-06-16 LAB — MAGNESIUM: Magnesium: 3.2 mg/dL — ABNORMAL HIGH (ref 1.7–2.4)

## 2023-06-16 LAB — CULTURE, BLOOD (ROUTINE X 2): Special Requests: ADEQUATE

## 2023-06-16 LAB — HEMOGLOBIN A1C
Hgb A1c MFr Bld: 5.6 % (ref 4.8–5.6)
Mean Plasma Glucose: 114 mg/dL

## 2023-06-16 MED ORDER — INSULIN ASPART 100 UNIT/ML IJ SOLN
0.0000 [IU] | Freq: Three times a day (TID) | INTRAMUSCULAR | Status: DC
  Administered 2023-06-16: 1 [IU] via SUBCUTANEOUS

## 2023-06-16 MED ORDER — SODIUM CHLORIDE 0.9 % IV SOLN: INTRAVENOUS | Status: DC

## 2023-06-16 NOTE — Hospital Course (Addendum)
Mrs. Fiddler was admitted to the hospital with the working diagnosis of dehydration, hypotension and hyponatremia.   89 yo female with the past medical history of hypertension, hyperlipidemia, T2DM, and subdural hematoma who presented with weakness. Difficult to obtain history due to language barrier. Patient not feeling well at home, her neighbor called EMS. She was found hypotensive with systolic blood pressure in the 90's, received 200 cc IV fluid bolus and was transported to the ED. Apparently she took at home unintentionally 5 tablets of amlodipine 5 mg at once.   On her initial physical examination her blood pressure was 127/47, HR 58, RR 22 and 02 saturation 100%, lungs with no wheezing or rales, heart with S1 and S2 present and regular with no gallops, rubs or murmurs, abdomen with no distention and no lower extremity edema.   VBG 7,44/ 22/ 115/ 16/ 99%   Na 128, K 4,0 Cl 96, bicarbonate 15, glucose 262, bun 68 cr 2,87.  Anion gap 17  Mg 3.1  BNP 1,495 High sensitive troponin 13 Lactic acid 2,4  Wbc 7,6 hgb 8,7 plt 354  Urine analysis SG 1,016, negative protein, negative glucose, negative hgb and negative leukocytes. pH 5.0   Chest radiograph with cardiomegaly, bilateral hilar vascular congestion and mild cephalization of the vasculature, fluid in the right fissure.    EKG 54 bpm, normal axis, normal intervals, sinus rhythm with low voltage, no significant ST segment changes, negative T wave V3 to V6.   Echocardiogram with preserved LV systolic function. Heart failure was ruled out.  Patient responded well to IV fluids.   Patient has recovered well with improvement in blood pressure, electrolytes and renal function.  Continue very weak and deconditioned, plan to transfer to SNF.

## 2023-06-16 NOTE — Progress Notes (Signed)
Heart Failure Navigator Progress Note  Assessed for Heart & Vascular TOC clinic readiness.  Patient does not meet criteria due to EF 65-70%, No HF TOC per Dr. Ella Jubilee. .   Navigator will sign off at this time.   Rhae Hammock, BSN, Scientist, clinical (histocompatibility and immunogenetics) Only

## 2023-06-16 NOTE — Assessment & Plan Note (Deleted)
Continue statin therapy.

## 2023-06-16 NOTE — Progress Notes (Signed)
Progress Note   Patient: Robin Andrews UJW:[id number] DOB: [birth year] DOA: 06/15/2023     0 DOS: the patient was seen and examined on 06/16/2023   Brief hospital course: Robin Andrews was admitted to the hospital with the working diagnosis of heart failure decompensation.   89 yo female with the past medical history of hypertension, hyperlipidemia, T2DM, and subdural hematoma who presented with weakness. Difficult to obtain history due to language barrier. Patient not feeling well at home, her neighbor called EMS. She was found hypotensive with systolic blood pressure in the 90's, received 200 cc IV fluid bolus and was transported to the ED. On her initial physical examination her blood pressure was 127/47, HR 58, RR 22 and 02 saturation 100%, lungs with no wheezing or rales, heart with S1 and S2 present and regular with no gallops, rubs or murmurs, abdomen with no distention and no lower extremity edema.   VBG 7,44/ 22/ 115/ 16/ 99%   Na 128, K 4,0 Cl 96, bicarbonate 15, glucose 262, bun 68 cr 2,87.  Anion gap 17  Mg 3.1  BNP 1,495 High sensitive troponin 13 Lactic acid 2,4  Wbc 7,6 hgb 8,7 plt 354  Urine analysis SG 1,016, negative protein, negative glucose, negative hgb and negative leukocytes. pH 5.0   Chest radiograph with cardiomegaly, bilateral hilar vascular congestion and mild cephalization of the vasculature, fluid in the right fissure.    EKG 54 bpm, normal axis, normal intervals, sinus rhythm with low voltage, no significant ST segment changes, negative T wave V3 to V6.   Assessment and Plan: * AKI (acute kidney injury) (HCC) Hyponatremia, anion gap metabolic acidosis.   Renal function with serum cr at 2,94 with K at 3,9 and serum bicarbonate at 16. Systolic blood pressure 97 to 562 mmHg.  Possible pre renal renal failure.   Plan to resume IV fluids with isotonic saline at 75 ml per hr.  Regular diet with no restrictions.  Further work up with renal US and urinary  electrolytes.  Pt and Ot   Heart failure has been ruled out.  Echocardiogram with preserved LV systolic function with EF 65 to 70%, with no LVH, RV systolic function preserved. No atrial dilatation, moderate tricuspid regurgitation.   Diabetes mellitus type 2, controlled, without complications (HCC) Fasting glucose is 179, plan to continue insulin sliding scale for glucose cover and monitoring.   Hyperlipidemia Continue statin therapy.  HYPERTENSION, BENIGN ESSENTIAL Continue blood pressure monitoring.  At home patient on amlodipine.         Subjective: Patient continue to feel very weak and deconditioned, at home with no diarrhea and reported appropriate po intake.  Used [place] interpreter.   Physical Exam: Vitals:   06/15/23 1945 06/16/23 0011 06/16/23 0327 06/16/23 1100  BP: (!) 128/46 (!) 121/40 (!) 97/33 (!) 134/49  Pulse: (!) 58 (!) 51 (!) 54   Resp: 15 17 17  (!) 23  Temp: 98.6 F (37 C) 98.6 F (37 C) 98.6 F (37 C) 97.8 F (36.6 C)  TempSrc: Oral Oral Oral   SpO2: 93% 90% 93%   Weight:   42.3 kg   Height:       Neurology awake and alert, I suspect she has some cognitive impairment.  ENT with mild pallor with no icterus Cardiovascular with S1 and S2 present and rhythmic with no gallops, rubs or murmurs No JVD No lower extremity edema Respiratory with no rales or wheezing Abdomen with no distention, non tender  Data Reviewed:  Family Communication: no family at the bedside   Disposition: Status is: Observation The patient will require care spanning > 2 midnights and should be moved to inpatient because: IV fluids   Planned Discharge Destination: Home  Author: Coralie Keens, MD 06/16/2023 1:13 PM  For on call review [email or web address].

## 2023-06-16 NOTE — TOC Initial Note (Addendum)
Transition of Care [facility]) - Initial/Assessment Note    Patient Details  Name: Robin Andrews MRN: [id number] Date of Birth: 05/14/1931  Transition of Care [facility]) CM/SW Contact:    Leone Haven, RN Phone Number: 06/16/2023, 12:25 PM  Clinical Narrative:                 Patient speaks [place] NCM used Video interpreter to speak with patient, she states she lives alone and she has PCP and insurance on file.  She currently does not have any HH services or DME at home. She states she is indep.  She has no one to transport her home, she will need assistance with transportation at dc.  She states the pharmacy sends her medications to her in the mail and she does not know the name of the pharmacy.  Will benefit from PT eval.   Expected Discharge Plan: Home/Self Care Barriers to Discharge: Continued Medical Work up   Patient Goals and CMS Choice Patient states their goals for this hospitalization and ongoing recovery are:: return home   Choice offered to / list presented to : NA      Expected Discharge Plan and Services In-house Referral: NA Discharge Planning Services: CM Consult Post Acute Care Choice: NA Living arrangements for the past 2 months: Single Family Home                 DME Arranged: N/A DME Agency: NA       HH Arranged: NA          Prior Living Arrangements/Services Living arrangements for the past 2 months: Single Family Home Lives with:: Self Patient language and need for interpreter reviewed:: Yes Do you feel safe going back to the place where you live?: Yes      Need for Family Participation in Patient Care: No (Comment) Care giver support system in place?: No (comment)   Criminal Activity/Legal Involvement Pertinent to Current Situation/Hospitalization: No - Comment as needed  Activities of Daily Living      Permission Sought/Granted Permission sought to share information with : Case Manager                Emotional  Assessment Appearance:: Appears stated age Attitude/Demeanor/Rapport: Engaged Affect (typically observed): Appropriate Orientation: : Oriented to Self, Oriented to Place, Oriented to  Time, Oriented to Situation   Psych Involvement: No (comment)  Admission diagnosis:  Acute systolic congestive heart failure (HCC) [I50.21] AKI (acute kidney injury) (HCC) [N17.9] Acute on chronic systolic (congestive) heart failure (HCC) [I50.23] Acute congestive heart failure, unspecified heart failure type (HCC) [I50.9] Patient Active Problem List   Diagnosis Date Noted   Acute CHF (congestive heart failure) (HCC) 06/15/2023   Elevated brain natriuretic peptide (BNP) level 06/15/2023   Hematoma of occipital region of scalp 08/11/2021   SDH (subdural hematoma) (HCC) 08/08/2021   Hyponatremia 01/29/2021   Acute metabolic encephalopathy 01/29/2021   Vertigo 04/18/2020   Noninfectious otitis externa of right ear 04/18/2020   Chronic neck pain 11/09/2019   Chronic bilateral low back pain without sciatica 11/09/2019   Hypokalemia    Diabetes mellitus type 2, controlled, without complications (HCC) 12/18/2017   Cardiac murmur 12/18/2017   Cervical disc disease 05/25/2012   Chronic pain syndrome 05/25/2012   Osteoarthritis 05/25/2012   Hyperlipidemia 05/30/2007   HYPERTENSION, BENIGN ESSENTIAL 05/30/2007   ASTEATOTIC ECZEMA 05/30/2007   PCP:  Pearson Grippe, MD Pharmacy:   [facility] [phone] [address], [place] - [phone] AT [facility] & [address] [place] [address] Phone: [phone] Fax: [phone]  [facility] - [place], [place] - [address] [place] [address] Phone: [phone] Fax: [phone]     Social Determinants of Health (SDOH) Social History: SDOH Screenings   Depression (PHQ2-9): Low Risk  (06/27/2020)  Tobacco Use: Low Risk  (12/28/2022)   SDOH Interventions:     Readmission Risk  Interventions     No data to display

## 2023-06-16 NOTE — Assessment & Plan Note (Addendum)
Hyponatremia, anion gap metabolic acidosis, hyperphosphatemia.  Pre renal renal failure, she may have some component of chronic renal disease.   Urine osmolality 410, with urinary Na less than 10. Urine K 23 and Cl <15.  Renal US with no hydronephrosis, bilateral pleural effusions.  Incidental sludge and stones in the gallbladder.   Patient was placed on IV isotonic saline with improvement in her volume status and electrolytes. Follow up renal function in am.   Heart failure has been ruled out.  Echocardiogram with preserved LV systolic function with EF 65 to 70%, with no LVH, RV systolic function preserved. No atrial dilatation, moderate tricuspid regurgitation.   Acute metabolic encephalopathy likely multifactorial, including hyponatremia and dehydration.  Her mentation is back to he baseline.

## 2023-06-16 NOTE — Evaluation (Signed)
Occupational Therapy Evaluation Patient Details Name: Robin Andrews MRN: [id number] DOB: 1931-08-29 Today's Date: 06/16/2023   History of Present Illness Pt is a 89 y/o F presenting to ED on 6/25 with weakness, admitted for AKI. PMH incldues HTN, HLD, diabetes, SDH, chronic pain   Clinical Impression   Pt reports independence at baseline with ADLs and mobility, per chart review, lives alone and has assist from niece/nephew PRN for IADLs. Pt currently needing set up - mod A for ADLs, min guard-mod A for bed mobility. Pt reporting feeling very fatigued/SOB with sitting EOB, declines standing and scooting attempts, requests to lay back down. Pt presenting with impairments listed below, will follow acutely. Patient will benefit from continued inpatient follow up therapy, <3 hours/day to maximize safety/ind with ADLs/functional mobility.       Recommendations for follow up therapy are one component of a multi-disciplinary discharge planning process, led by the attending physician.  Recommendations may be updated based on patient status, additional functional criteria and insurance authorization.   Assistance Recommended at Discharge Frequent or constant Supervision/Assistance  Patient can return home with the following A lot of help with walking and/or transfers;A lot of help with bathing/dressing/bathroom;Direct supervision/assist for medications management;Direct supervision/assist for financial management;Help with stairs or ramp for entrance;Assistance with cooking/housework;Assist for transportation    Functional Status Assessment  Patient has had a recent decline in their functional status and demonstrates the ability to make significant improvements in function in a reasonable and predictable amount of time.  Equipment Recommendations  Other (comment) (defer)    Recommendations for Other Services PT consult     Precautions / Restrictions Precautions Precautions:  Fall Restrictions Weight Bearing Restrictions: No      Mobility Bed Mobility Overal bed mobility: Needs Assistance Bed Mobility: Supine to Sit, Sit to Supine     Supine to sit: Mod assist Sit to supine: Min guard   General bed mobility comments: assist for trunk elevation    Transfers                   General transfer comment: pt declines standing or scooting EOB, becoming very anxious and stating "lay down". Per RN, pt transferred to bathroom with 1 person assist with strong grip on staff      Balance Overall balance assessment: Needs assistance Sitting-balance support: Feet supported Sitting balance-Leahy Scale: Fair Sitting balance - Comments: reaches minimally outside BOS                                   ADL either performed or assessed with clinical judgement   ADL Overall ADL's : Needs assistance/impaired Eating/Feeding: Set up;Sitting   Grooming: Minimal assistance;Sitting   Upper Body Bathing: Moderate assistance;Sitting   Lower Body Bathing: Moderate assistance;Sitting/lateral leans   Upper Body Dressing : Moderate assistance;Sitting   Lower Body Dressing: Moderate assistance;Sitting/lateral leans   Toilet Transfer: Moderate assistance           Functional mobility during ADLs: Moderate assistance       Vision   Additional Comments: unable to read words that interpreter typed on screen     Perception Perception Perception Tested?: No   Praxis Praxis Praxis tested?: Not tested    Pertinent Vitals/Pain Pain Assessment Pain Assessment: No/denies pain     Hand Dominance     Extremity/Trunk Assessment Upper Extremity Assessment Upper Extremity Assessment: Generalized weakness   Lower Extremity Assessment Lower  Extremity Assessment: Defer to PT evaluation       Communication Communication Communication: Prefers language other than [place];Interpreter utilized [phone])   Cognition Arousal/Alertness:  Awake/alert Behavior During Therapy: Anxious, Restless Overall Cognitive Status: Difficult to assess                                 General Comments: pt aware she is at the hospital, states "[place]", understand commands but is very anxious regarding mobility/fatigue/falls     General Comments  VSS on RA    Exercises     Shoulder Instructions      Home Living Family/patient expects to be discharged to:: Private residence Living Arrangements: Alone Available Help at Discharge: Family;Available PRN/intermittently (niece and nephew per chart review)                         Home Equipment: None   Additional Comments: info obtained per chart review, pt HOH and unable to state home setup despite use of virtual interpreter      Prior Functioning/Environment               Mobility Comments: pt reports ind without AD ADLs Comments: pt reports ind, per chart review has PRN assist from nieces/nephews for IADL        OT Problem List: Decreased strength;Decreased range of motion;Decreased activity tolerance      OT Treatment/Interventions: Self-care/ADL training;Therapeutic exercise;Energy conservation;DME and/or AE instruction;Therapeutic activities;Balance training;Patient/family education    OT Goals(Current goals can be found in the care plan section) Acute Rehab OT Goals Patient Stated Goal: none stated OT Goal Formulation: With patient Time For Goal Achievement: 06/30/23 Potential to Achieve Goals: Good ADL Goals Pt Will Perform Grooming: with supervision;standing Pt Will Perform Upper Body Dressing: with min assist;sitting Pt Will Perform Lower Body Dressing: with min assist;sit to/from stand;sitting/lateral leans Pt Will Transfer to Toilet: with min assist;ambulating;regular height toilet  OT Frequency: Min 1X/week    Co-evaluation              AM-PAC OT "6 Clicks" Daily Activity     Outcome Measure Help from another person  eating meals?: A Little Help from another person taking care of personal grooming?: A Little Help from another person toileting, which includes using toliet, bedpan, or urinal?: A Lot Help from another person bathing (including washing, rinsing, drying)?: A Lot Help from another person to put on and taking off regular upper body clothing?: A Lot Help from another person to put on and taking off regular lower body clothing?: A Lot 6 Click Score: 14   End of Session Nurse Communication: Mobility status  Activity Tolerance: Patient tolerated treatment well Patient left: in bed;with call bell/phone within reach;with bed alarm set  OT Visit Diagnosis: Unsteadiness on feet (R26.81);Other abnormalities of gait and mobility (R26.89);Muscle weakness (generalized) (M62.81);History of falling (Z91.81)                Time: 0865-7846 OT Time Calculation (min): 18 min Charges:  OT General Charges $OT Visit: 1 Visit OT Evaluation $OT Eval Moderate Complexity: 1 Mod  Doratha Mcswain K, OTD, OTR/L SecureChat Preferred Acute Rehab [phone]  Carver Fila Koonce 06/16/2023, 4:17 PM

## 2023-06-16 NOTE — Evaluation (Signed)
Physical Therapy Evaluation Patient Details Name: Robin Andrews MRN: [id number] DOB: 10-25-1931 Today's Date: 06/16/2023  History of Present Illness  Pt is a 89 y/o F presenting to ED on 6/25 with weakness, admitted for AKI; initially working dx of CHF but has been ruled out. PMH incldues HTN, HLD, diabetes, SDH, chronic pain  Clinical Impression  Pt admitted with above diagnosis. Pt is somewhat questionable historian - limited by [facility], language barrier (did utilize AMN interpreter), fatigued, and distracted.  She does report living alone and independent with adls, driving, and ambulation.  States limited support at d/c.  Does report "I have something like that at home" in regards to RW.  Today, pt requiring min guard to min A and was able to ambulate 12'x2 with RW.  She fatigued very easily and only agreeable to walk to bathroom to urinate.  Pt was impulsive with transfers due to lines and difficulty understanding cues at times.   Her VSS on RA but she had significant DOE with short distance ambulation.  At this time Patient will benefit from continued inpatient follow up therapy, <3 hours/day due to decreased safety, easy fatigue, and limited support; however, pt with good rehab potential and could progress to lower level of care with acute therapy. Pt currently with functional limitations due to the deficits listed below (see PT Problem List). Pt will benefit from acute skilled PT to increase their independence and safety with mobility to allow discharge.          Recommendations for follow up therapy are one component of a multi-disciplinary discharge planning process, led by the attending physician.  Recommendations may be updated based on patient status, additional functional criteria and insurance authorization.  Follow Up Recommendations Can patient physically be transported by private vehicle: Yes     Assistance Recommended at Discharge Intermittent Supervision/Assistance  Patient can return  home with the following  A little help with walking and/or transfers;A little help with bathing/dressing/bathroom;Assistance with cooking/housework;Help with stairs or ramp for entrance    Equipment Recommendations Rolling walker (2 wheels) (need to confirm pt has one - if not needs RW)  Recommendations for Other Services       Functional Status Assessment Patient has had a recent decline in their functional status and demonstrates the ability to make significant improvements in function in a reasonable and predictable amount of time.     Precautions / Restrictions Precautions Precautions: Fall Restrictions Weight Bearing Restrictions: No      Mobility  Bed Mobility Overal bed mobility: Needs Assistance Bed Mobility: Supine to Sit, Sit to Supine     Supine to sit: Supervision Sit to supine: Supervision   General bed mobility comments: Pt very impulsive with lines needing to go to bathroom and then fatigued and wanting to lay back down with return to bed.  Pt limited by language barrier and HOH- not following cues for turns and ending up tangled in lines while trying to lay down.    Transfers Overall transfer level: Needs assistance Equipment used: Rolling walker (2 wheels) Transfers: Sit to/from Stand Sit to Stand: Min guard, Min assist           General transfer comment: Min guard from bed and min A from lower toilet    Ambulation/Gait Ambulation/Gait assistance: Min assist Gait Distance (Feet): 12 Feet (12'x2) Assistive device: Rolling walker (2 wheels) Gait Pattern/deviations: Step-through pattern, Decreased stride length Gait velocity: decreased     General Gait Details: Min A to steady  and guide RW  Stairs            Wheelchair Mobility    Modified Rankin (Stroke Patients Only)       Balance Overall balance assessment: Needs assistance Sitting-balance support: No upper extremity supported Sitting balance-Leahy Scale: Fair     Standing  balance support: Bilateral upper extremity supported Standing balance-Leahy Scale: Poor Standing balance comment: REquriing RW                             Pertinent Vitals/Pain Pain Assessment Pain Assessment: No/denies pain    Home Living Family/patient expects to be discharged to:: Private residence Living Arrangements: Alone Available Help at Discharge: Family;Available PRN/intermittently (niece/nephew per chart review but pt reports no assist) Type of Home: Independent living facility (reports senior living apt) Home Access: Stairs to enter   Entrance Stairs-Number of Steps: 1   Home Layout: One level Home Equipment: Agricultural consultant (2 wheels) Additional Comments: Somewhat questionable historian due to [facility], anxious, requiring interpreter, distracted    Prior Function Prior Level of Function : Independent/Modified Independent;Driving             Mobility Comments: Pt reports has RW at home but independent.  States she could ambulate in community; denies falls ADLs Comments: pt reports ind, per chart review has PRN assist from nieces/nephews for IADL     Hand Dominance        Extremity/Trunk Assessment   Upper Extremity Assessment Upper Extremity Assessment: Generalized weakness;Difficult to assess due to impaired cognition    Lower Extremity Assessment Lower Extremity Assessment: Generalized weakness;Difficult to assess due to impaired cognition (at least 3/5 but not folllowing further commands)    Cervical / Trunk Assessment Cervical / Trunk Assessment: Normal  Communication   Communication: Prefers language other than [place];Interpreter utilized (AMN interpretor Trinna Post [phone])  Cognition Arousal/Alertness: Awake/alert Behavior During Therapy: Anxious, Impulsive Overall Cognitive Status: Difficult to assess                                 General Comments: Pt aware in hospital but keeps asking for her MD..  States she is very  fatigued and just needs treatment        General Comments General comments (skin integrity, edema, etc.): Pt on RA with O2 sats 95% and HR 70's with activity.  She did have DOE of 3/4 with walking resolved with rest    Exercises     Assessment/Plan    PT Assessment Patient needs continued PT services  PT Problem List Decreased strength;Decreased coordination;Cardiopulmonary status limiting activity;Decreased cognition;Decreased activity tolerance;Decreased safety awareness;Decreased balance;Decreased mobility;Decreased knowledge of precautions;Decreased knowledge of use of DME       PT Treatment Interventions DME instruction;Therapeutic exercise;Gait training;Stair training;Balance training;Therapeutic activities;Patient/family education;Functional mobility training    PT Goals (Current goals can be found in the Care Plan section)  Acute Rehab PT Goals Patient Stated Goal: "get treatment" PT Goal Formulation: With patient Time For Goal Achievement: 06/30/23 Potential to Achieve Goals: Good    Frequency Min 1X/week     Co-evaluation               AM-PAC PT "6 Clicks" Mobility  Outcome Measure Help needed turning from your back to your side while in a flat bed without using bedrails?: A Little Help needed moving from lying on your back to sitting on the side  of a flat bed without using bedrails?: A Little Help needed moving to and from a bed to a chair (including a wheelchair)?: A Little Help needed standing up from a chair using your arms (e.g., wheelchair or bedside chair)?: A Little Help needed to walk in hospital room?: A Little Help needed climbing 3-5 steps with a railing? : A Little 6 Click Score: 18    End of Session Equipment Utilized During Treatment: Gait belt Activity Tolerance: Patient limited by fatigue Patient left: in bed;with call bell/phone within reach;with bed alarm set;Other (comment) (U/S tech in room) Nurse Communication: Mobility status PT  Visit Diagnosis: Other abnormalities of gait and mobility (R26.89)    Time: 4098-1191 PT Time Calculation (min) (ACUTE ONLY): 18 min   Charges:   PT Evaluation $PT Eval Low Complexity: 1 Low          Jane Birkel, PT Acute Rehab [facility] [phone]   Rayetta Humphrey 06/16/2023, 4:55 PM

## 2023-06-16 NOTE — Assessment & Plan Note (Addendum)
Initially hyperglycemic, with improvement with insulin sliding scale.   It is possible she had some component of ketosis on admission. Will need close follow up as outpatient.   Today fasting glucose is 141 mg/dl.   Continue with statin therapy.

## 2023-06-16 NOTE — Assessment & Plan Note (Addendum)
Continue amlodipine 

## 2023-06-16 NOTE — Assessment & Plan Note (Addendum)
Iron panel with serum iron 26, TIBC 265, ferritin 100, transferrin 190 and transferrin saturation 10.  Consistent with anemia of chronic disease with iron deficiency.   SP IV iron infusion with ferumoxytol 500 mg. Follow up iron panel as outpatient.

## 2023-06-17 DIAGNOSIS — D509 Iron deficiency anemia, unspecified: Secondary | ICD-10-CM

## 2023-06-17 LAB — GLUCOSE, CAPILLARY
Glucose-Capillary: 87 mg/dL (ref 70–99)
Glucose-Capillary: 151 mg/dL — ABNORMAL HIGH (ref 70–99)
Glucose-Capillary: 140 mg/dL — ABNORMAL HIGH (ref 70–99)

## 2023-06-17 LAB — IRON AND TIBC
Iron: 26 ug/dL — ABNORMAL LOW (ref 28–170)
Saturation Ratios: 10 % — ABNORMAL LOW (ref 10.4–31.8)
TIBC: 265 ug/dL (ref 250–450)
UIBC: 239 ug/dL

## 2023-06-17 LAB — OSMOLALITY, URINE: Osmolality, Ur: 410 mOsm/kg (ref 300–900)

## 2023-06-17 LAB — FERRITIN: Ferritin: 100 ng/mL (ref 11–307)

## 2023-06-17 LAB — RENAL FUNCTION PANEL
Albumin: 3.2 g/dL — ABNORMAL LOW (ref 3.5–5.0)
Anion gap: 10 (ref 5–15)
BUN: 57 mg/dL — ABNORMAL HIGH (ref 8–23)
CO2: 17 mmol/L — ABNORMAL LOW (ref 22–32)
Calcium: 7.8 mg/dL — ABNORMAL LOW (ref 8.9–10.3)
Chloride: 108 mmol/L (ref 98–111)
Creatinine, Ser: 2.22 mg/dL — ABNORMAL HIGH (ref 0.44–1.00)
GFR, Estimated: 21 mL/min — ABNORMAL LOW (ref >60)
Glucose, Bld: 92 mg/dL (ref 70–99)
Phosphorus: 5.6 mg/dL — ABNORMAL HIGH (ref 2.5–4.6)
Potassium: 4.2 mmol/L (ref 3.5–5.1)
Sodium: 135 mmol/L (ref 135–145)

## 2023-06-17 LAB — TRANSFERRIN: Transferrin: 190 mg/dL — ABNORMAL LOW (ref 192–382)

## 2023-06-17 LAB — CHLORIDE, URINE, RANDOM: Chloride Urine: <15 mmol/L

## 2023-06-17 LAB — NA AND K (SODIUM & POTASSIUM), RAND UR
Potassium Urine: 23 mmol/L
Sodium, Ur: <10 mmol/L

## 2023-06-17 MED ORDER — SODIUM CHLORIDE 0.9 % IV SOLN
510.0000 mg | Freq: Once | INTRAVENOUS | Status: AC
  Administered 2023-06-17: 510 mg via INTRAVENOUS
  Filled 2023-06-17: qty 17

## 2023-06-17 MED ORDER — INSULIN ASPART 100 UNIT/ML IJ SOLN
0.0000 [IU] | Freq: Three times a day (TID) | INTRAMUSCULAR | Status: DC
  Administered 2023-06-17: 1 [IU] via SUBCUTANEOUS

## 2023-06-17 NOTE — Progress Notes (Signed)
Physical Therapy Treatment Patient Details Name: Robin Andrews MRN: [id number] DOB: Mar 26, 1931 Today's Date: 06/17/2023   History of Present Illness Pt is a 89 y/o F presenting to ED on 6/25 with weakness, admitted for AKI; initially working dx of CHF but has been ruled out. PMH incldues HTN, HLD, diabetes, SDH, chronic pain    PT Comments    Pt received in supine asleep and is easily awoken. Pt stating she is cold and hungry, repeating "I eat" multiple times. However, there is not a food tray in pt's room, so applesauce was provided. Pt able to sit to EOB x3 with min A and eat applesauce with supervision. Pt returning to sidelying quickly and requires cues to return to sitting EOB to finish eating. Pt very adamant about not returning home, repeating "I don't want to die at home, I want to die here" multiple times. Pt returning to sidelying despite cues to participate in mobility, stating that she needs to rest after eating. Pt continues to benefit from PT services to progress toward functional mobility goals.    Recommendations for follow up therapy are one component of a multi-disciplinary discharge planning process, led by the attending physician.  Recommendations may be updated based on patient status, additional functional criteria and insurance authorization.  Follow Up Recommendations  Can patient physically be transported by private vehicle: Yes    Assistance Recommended at Discharge Intermittent Supervision/Assistance  Patient can return home with the following A little help with walking and/or transfers;A little help with bathing/dressing/bathroom;Assistance with cooking/housework;Help with stairs or ramp for entrance   Equipment Recommendations  Rolling walker (2 wheels)    Recommendations for Other Services       Precautions / Restrictions Precautions Precautions: Fall Restrictions Weight Bearing Restrictions: No     Mobility  Bed Mobility Overal bed mobility: Needs  Assistance Bed Mobility: Supine to Sit, Sit to Supine     Supine to sit: Min assist Sit to supine: Supervision   General bed mobility comments: Min A via HHA to sit to EOB x3. Pt quickly returning to sidelying each time with supervision.    Transfers                   General transfer comment: Pt declining OOB mobility        Balance Overall balance assessment: Needs assistance Sitting-balance support: No upper extremity supported Sitting balance-Leahy Scale: Fair Sitting balance - Comments: sitting EOB                                    Cognition Arousal/Alertness: Awake/alert Behavior During Therapy: Anxious, Impulsive Overall Cognitive Status: Difficult to assess                                 General Comments: Pt distracted by being cold and hungry and is unable to be redirected        Exercises      General Comments General comments (skin integrity, edema, etc.): Ipad interpreter Wilkie Aye [phone] utilized throughout session      Pertinent Vitals/Pain Pain Assessment Pain Assessment: No/denies pain     PT Goals (current goals can now be found in the care plan section) Acute Rehab PT Goals Patient Stated Goal: "get treatment" PT Goal Formulation: With patient Time For Goal Achievement: 06/30/23 Potential to Achieve Goals: Good Progress towards  PT goals: Progressing toward goals    Frequency    Min 1X/week      PT Plan Current plan remains appropriate       AM-PAC PT "6 Clicks" Mobility   Outcome Measure  Help needed turning from your back to your side while in a flat bed without using bedrails?: None Help needed moving from lying on your back to sitting on the side of a flat bed without using bedrails?: A Little Help needed moving to and from a bed to a chair (including a wheelchair)?: A Little Help needed standing up from a chair using your arms (e.g., wheelchair or bedside chair)?: A Little Help needed  to walk in hospital room?: A Little Help needed climbing 3-5 steps with a railing? : A Little 6 Click Score: 19    End of Session   Activity Tolerance: Patient limited by fatigue;Other (comment) (Limited by being hungry) Patient left: in bed;with call bell/phone within reach;with bed alarm set Nurse Communication: Mobility status PT Visit Diagnosis: Other abnormalities of gait and mobility (R26.89)     Time: 8756-4332 PT Time Calculation (min) (ACUTE ONLY): 22 min  Charges:  $Therapeutic Activity: 8-22 mins                     Johny Shock, PTA Acute Rehabilitation Services Secure Chat Preferred  [phone]    Johny Shock 06/17/2023, 10:14 AM

## 2023-06-17 NOTE — Discharge Summary (Addendum)
Physician Discharge Summary   Patient: Robin Andrews MRN: [id number] DOB: 04/03/1931  Admit date:     06/15/2023  Discharge date: 06/21/23  Discharge Physician: Robin Andrews   PCP: Pearson Grippe, MD   Recommendations at discharge:    Patient will continue amlodipine 5 mg for blood pressure control. Follow up renal function and electrolytes in 7 days as outpatient.  Follow up iron panel in 2 to 3 weeks.  Follow up with Dr Selena Batten in 7 to 10 days.  Her discharge was delayed due to arrangements for SNF. I spoke with patient's grandson at the bedside, we talked in detail about patient's condition, plan of care and prognosis and all questions were addressed.    Discharge Diagnoses: Principal Problem:   AKI (acute kidney injury) (HCC) Active Problems:   Diabetes mellitus type 2, controlled, without complications (HCC)   Hyperlipidemia   HYPERTENSION, BENIGN ESSENTIAL   Iron deficiency anemia  Resolved Problems:   * No resolved hospital problems. [facility] Course: [name] was admitted to the hospital with the working diagnosis of dehydration, hypotension and hyponatremia.   89 yo female with the past medical history of hypertension, hyperlipidemia, T2DM, and subdural hematoma who presented with weakness. Difficult to obtain history due to language barrier. Patient not feeling well at home, her neighbor called EMS. She was found hypotensive with systolic blood pressure in the 90's, received 200 cc IV fluid bolus and was transported to the ED. Apparently she took at home unintentionally 5 tablets of amlodipine 5 mg at once.   On her initial physical examination her blood pressure was 127/47, HR 58, RR 22 and 02 saturation 100%, lungs with no wheezing or rales, heart with S1 and S2 present and regular with no gallops, rubs or murmurs, abdomen with no distention and no lower extremity edema.   VBG 7,44/ 22/ 115/ 16/ 99%   Na 128, K 4,0 Cl 96, bicarbonate 15, glucose 262, bun 68 cr  2,87.  Anion gap 17  Mg 3.1  BNP 1,495 High sensitive troponin 13 Lactic acid 2,4  Wbc 7,6 hgb 8,7 plt 354  Urine analysis SG 1,016, negative protein, negative glucose, negative hgb and negative leukocytes. pH 5.0   Chest radiograph with cardiomegaly, bilateral hilar vascular congestion and mild cephalization of the vasculature, fluid in the right fissure.    EKG 54 bpm, normal axis, normal intervals, sinus rhythm with low voltage, no significant ST segment changes, negative T wave V3 to V6.   Echocardiogram with preserved LV systolic function. Heart failure was ruled out.  Patient responded well to IV fluids.   Patient has recovered well with improvement in blood pressure, electrolytes and renal function.  Continue very weak and deconditioned, plan to transfer to SNF.   Assessment and Plan: * AKI (acute kidney injury) (HCC) Hyponatremia, anion gap metabolic acidosis, hyperphosphatemia.  Pre renal renal failure, she may have some component of chronic renal disease.   Urine osmolality 410, with urinary Na less than 10. Urine K 23 and Cl <15.  Renal US with no hydronephrosis, bilateral pleural effusions.  Incidental sludge and stones in the gallbladder.   Patient was placed on IV isotonic saline with improvement in her volume status and electrolytes.  Heart failure has been ruled out.  Echocardiogram with preserved LV systolic function with EF 65 to 70%, with no LVH, RV systolic function preserved. No atrial dilatation, moderate tricuspid regurgitation.   Acute metabolic encephalopathy likely multifactorial, including hyponatremia and dehydration, now  resolved.   Type 2 diabetes mellitus with hyperlipidemia (HCC) Initially hyperglycemic, with improvement with insulin sliding scale.   It is possible she had some component of ketosis on admission. Will need close follow up as outpatient.   Continue with statin therapy.   HYPERTENSION, BENIGN ESSENTIAL Continue amlodipine for  blood pressure control.   Iron deficiency anemia Iron panel with serum iron 26, TIBC 265, ferritin 100, transferrin 190 and transferrin saturation 10.  Consistent with anemia of chronic disease with iron deficiency.   SP IV iron infusion with ferumoxytol 500 mg. Follow up iron panel as outpatient.     Consultants: none  Procedures performed: none   Disposition: Home Diet recommendation:  Discharge Diet Orders (From admission, onward)     Start     Ordered   06/17/23 0000  Diet - low sodium heart healthy        06/17/23 1244           Carb modified diet DISCHARGE MEDICATION: Allergies as of 06/21/2023       Reactions   Hydrochlorothiazide Hives, Itching   Severe hyponatremia resulting in hospitalization   Lisinopril Other (See Comments)   Unknown per grandson         Medication List     STOP taking these medications    acetaminophen-codeine 300-30 MG tablet Commonly known as: TYLENOL #3   gabapentin 100 MG capsule Commonly known as: Neurontin       TAKE these medications    amLODipine 5 MG tablet Commonly known as: NORVASC Take 5 mg by mouth daily.   feeding supplement Liqd Take 237 mLs by mouth daily.   multivitamin with minerals Tabs tablet Take 1 tablet by mouth daily.        Discharge Exam: Filed Weights   06/15/23 1231 06/16/23 0327 06/17/23 0500  Weight: 40 kg 42.3 kg 49 kg   BP (!) 143/58 (BP Location: Right Arm)   Pulse 85   Temp 98.6 F (37 C) (Oral)   Resp (!) 21   Ht 5\' 3"  (1.6 m)   Wt 46.9 kg   SpO2 94%   BMI 18.30 kg/m   Patient is feeling better, no chest pain or dyspnea.   Neurology awake and alert ENT with mild pallor Cardiovascular with S1 and S2 present and rhythmic with no gallops, rubs or murmurs Respiratory with no rales or wheezing, no rhonchi Abdomen with no distention  No lower extremity edema   Condition at discharge: stable  The results of significant diagnostics from this hospitalization (including  imaging, microbiology, ancillary and laboratory) are listed below for reference.   Imaging Studies: US RENAL  Result Date: 06/16/2023 CLINICAL DATA:  Acute kidney injury EXAM: RENAL / URINARY TRACT ULTRASOUND COMPLETE COMPARISON:  None Available. FINDINGS: Right Kidney: Renal measurements: 9.4 x 4.7 x 4.4 cm = volume: 100 mL. Echogenicity within normal limits. No mass or hydronephrosis visualized. Left Kidney: Renal measurements: 9.7 x 3.7 x 4.6 cm = volume: 85.8 mL. Echogenicity within normal limits. No mass or hydronephrosis visualized. Bladder: Appears normal for degree of bladder distention. Other: Incidental note is made of sludge and stones in the gallbladder and bilateral pleural effusions. IMPRESSION: 1. No hydronephrosis. 2. Incidental note is made of sludge and stones in the gallbladder. Dedicated gallbladder ultrasound could performed for better evaluation. 3. Bilateral pleural effusions. Electronically Signed   By: Allegra Lai M.D.   On: 06/16/2023 17:31   ECHOCARDIOGRAM COMPLETE  Result Date: 06/15/2023    ECHOCARDIOGRAM  REPORT   Patient Name:   MAKO KHERA [facility] Date of Exam: 06/15/2023 Medical Rec #:  [id number]    Height:       63.0 in Accession #:    [id number]   Weight:       88.2 lb Date of Birth:  03-15-1931     BSA:          1.365 m Patient Age:    89 years     BP:           127/47 mmHg Patient Gender: F            HR:           55 bpm. Exam Location:  Inpatient Procedure: 2D Echo, Cardiac Doppler and Color Doppler STAT ECHO Indications:    Congestive Heart Failure  History:        Patient has no prior history of Echocardiogram examinations.                 Arrythmias:Bradycardia, Signs/Symptoms:Fatigue; Risk                 Factors:Dyslipidemia, Hypertension and Diabetes.  Sonographer:    Wallie Char Referring Phys: [id number] Cecille Po MELVIN  Sonographer Comments: Messaged MD @ 3:23pm IMPRESSIONS  1. Left ventricular ejection fraction, by estimation, is 65 to 70%. The left ventricle  has normal function. The left ventricle has no regional wall motion abnormalities. Left ventricular diastolic parameters are indeterminate.  2. Right ventricular systolic function is normal. The right ventricular size is normal.  3. The mitral valve is normal in structure. Mild mitral valve regurgitation. No evidence of mitral stenosis.  4. Tricuspid valve regurgitation is moderate.  5. The aortic valve is normal in structure. Aortic valve regurgitation is not visualized. Aortic valve sclerosis/calcification is present, without any evidence of aortic stenosis.  6. The inferior vena cava is normal in size with greater than 50% respiratory variability, suggesting right atrial pressure of 3 mmHg. FINDINGS  Left Ventricle: Left ventricular ejection fraction, by estimation, is 65 to 70%. The left ventricle has normal function. The left ventricle has no regional wall motion abnormalities. The left ventricular internal cavity size was normal in size. There is  no left ventricular hypertrophy. Left ventricular diastolic parameters are indeterminate. Right Ventricle: The right ventricular size is normal. No increase in right ventricular wall thickness. Right ventricular systolic function is normal. Left Atrium: Left atrial size was normal in size. Right Atrium: Right atrial size was normal in size. Pericardium: There is no evidence of pericardial effusion. Mitral Valve: The mitral valve is normal in structure. Mild mitral annular calcification. Mild mitral valve regurgitation. No evidence of mitral valve stenosis. MV peak gradient, 6.0 mmHg. The mean mitral valve gradient is 2.0 mmHg. Tricuspid Valve: The tricuspid valve is normal in structure. Tricuspid valve regurgitation is moderate . No evidence of tricuspid stenosis. Aortic Valve: The aortic valve is normal in structure. Aortic valve regurgitation is not visualized. Aortic valve sclerosis/calcification is present, without any evidence of aortic stenosis. Aortic valve  mean gradient measures 7.0 mmHg. Aortic valve peak  gradient measures 15.4 mmHg. Aortic valve area, by VTI measures 1.43 cm. Pulmonic Valve: The pulmonic valve was normal in structure. Pulmonic valve regurgitation is not visualized. No evidence of pulmonic stenosis. Aorta: The aortic root is normal in size and structure. Venous: The inferior vena cava is normal in size with greater than 50% respiratory variability, suggesting right atrial pressure of 3 mmHg. IAS/Shunts: No atrial  level shunt detected by color flow Doppler.  LEFT VENTRICLE PLAX 2D LVIDd:         4.10 cm     Diastology LVIDs:         2.40 cm     LV e' medial:    6.50 cm/s LV PW:         1.00 cm     LV E/e' medial:  20.9 LV IVS:        0.70 cm     LV e' lateral:   5.88 cm/s LVOT diam:     1.50 cm     LV E/e' lateral: 23.1 LV SV:         58 LV SV Index:   42 LVOT Area:     1.77 cm  LV Volumes (MOD) LV vol d, MOD A2C: 56.5 ml LV vol d, MOD A4C: 57.4 ml LV vol s, MOD A2C: 14.4 ml LV vol s, MOD A4C: 13.7 ml LV SV MOD A2C:     42.1 ml LV SV MOD A4C:     57.4 ml LV SV MOD BP:      42.9 ml RIGHT VENTRICLE             IVC RV Basal diam:  2.90 cm     IVC diam: 2.00 cm RV S prime:     14.30 cm/s TAPSE (M-mode): 1.8 cm LEFT ATRIUM             Index        RIGHT ATRIUM           Index LA diam:        3.40 cm 2.49 cm/m   RA Area:     13.90 cm LA Vol (A2C):   31.7 ml 23.22 ml/m  RA Volume:   31.50 ml  23.07 ml/m LA Vol (A4C):   30.8 ml 22.56 ml/m LA Biplane Vol: 33.7 ml 24.68 ml/m  AORTIC VALVE AV Area (Vmax):    1.26 cm AV Area (Vmean):   1.37 cm AV Area (VTI):     1.43 cm AV Vmax:           196.50 cm/s AV Vmean:          123.500 cm/s AV VTI:            0.405 m AV Peak Grad:      15.4 mmHg AV Mean Grad:      7.0 mmHg LVOT Vmax:         140.00 cm/s LVOT Vmean:        95.800 cm/s LVOT VTI:          0.328 m LVOT/AV VTI ratio: 0.81  AORTA Ao Root diam: 3.00 cm Ao Asc diam:  3.60 cm MITRAL VALVE                TRICUSPID VALVE MV Area (PHT): 3.51 cm     TR  Peak grad:   43.0 mmHg MV Area VTI:   1.24 cm     TR Mean grad:   29.0 mmHg MV Peak grad:  6.0 mmHg     TR Vmax:        328.00 cm/s MV Mean grad:  2.0 mmHg     TR Vmean:       257.0 cm/s MV Vmax:       1.22 m/s MV Vmean:      58.0 cm/s    SHUNTS MV Decel Time: 216 msec  Systemic VTI:  0.33 m MV E velocity: 136.00 cm/s  Systemic Diam: 1.50 cm MV A velocity: 102.00 cm/s MV E/A ratio:  1.33 Kardie Tobb DO Electronically signed by Thomasene Ripple DO Signature Date/Time: 06/15/2023/3:57:30 PM    Final    DG Chest Port 1 View  Result Date: 06/15/2023 CLINICAL DATA:  Weakness and feeling cold.  Possible sepsis. EXAM: PORTABLE CHEST 1 VIEW COMPARISON:  Chest radiograph 08/08/2021 FINDINGS: The heart is enlarged, unchanged. The upper mediastinal contours are normal. There is hazy opacity over both lower lobes likely reflecting layering fluid and adjacent atelectasis. The upper lobes are clear. There is no overt pulmonary edema. There is no pneumothorax There is no acute osseous abnormality. IMPRESSION: Cardiomegaly with hazy opacities over both lower lobes likely reflecting layering pleural fluid and adjacent atelectasis. Electronically Signed   By: Lesia Hausen M.D.   On: 06/15/2023 13:40    Microbiology: Results for orders placed or performed during the hospital encounter of 06/15/23  Blood Culture (routine x 2)     Status: None (Preliminary result)   Collection Time: 06/15/23  2:07 PM   Specimen: BLOOD RIGHT ARM  Result Value Ref Range Status   Specimen Description BLOOD RIGHT ARM  Final   Special Requests   Final    BOTTLES DRAWN AEROBIC AND ANAEROBIC Blood Culture adequate volume   Culture   Final    NO GROWTH 2 DAYS Performed at [facility], [address], [place], [place] [address]    Report Status PENDING  Incomplete  Blood Culture (routine x 2)     Status: None (Preliminary result)   Collection Time: 06/15/23  2:22 PM   Specimen: BLOOD LEFT HAND  Result Value Ref Range Status    Specimen Description BLOOD LEFT HAND  Final   Special Requests   Final    BOTTLES DRAWN AEROBIC AND ANAEROBIC Blood Culture results may not be optimal due to an inadequate volume of blood received in culture bottles   Culture   Final    NO GROWTH 2 DAYS Performed at [facility], [address], [place], [place] [address]    Report Status PENDING  Incomplete    Labs: CBC: Recent Labs  Lab 06/15/23 1248 06/15/23 1259 06/15/23 1300 06/16/23 0033  WBC 7.6  --   --  10.2  NEUTROABS 6.2  --   --   --   HGB 8.7* 9.2* 10.2* 8.7*  HCT 27.0* 27.0* 30.0* 26.7*  MCV 88.8  --   --  84.5  PLT 354  --   --  366   Basic Metabolic Panel: Recent Labs  Lab 06/15/23 1248 06/15/23 1259 06/15/23 1300 06/16/23 0033 06/17/23 0126  NA 128* 128* 129* 127* 135  K 4.0 3.8 3.7 3.9 4.2  CL 96*  --  101 99 108  CO2 15*  --   --  16* 17*  GLUCOSE 262*  --  265* 178* 92  BUN 68*  --  65* 64* 57*  CREATININE 2.87*  --  2.90* 2.94* 2.22*  CALCIUM 7.8*  --   --  7.8* 7.8*  MG 3.1*  --   --  3.2*  --   PHOS  --   --   --   --  5.6*   Liver Function Tests: Recent Labs  Lab 06/15/23 1248 06/16/23 0033 06/17/23 0126  AST 37 32  --   ALT 23 25  --   ALKPHOS 97 103  --  BILITOT 0.8 0.5  --   PROT 6.5 6.6  --   ALBUMIN 3.4* 3.4* 3.2*   CBG: Recent Labs  Lab 06/16/23 1704 06/16/23 2111 06/16/23 2148 06/17/23 0608 06/17/23 1111  GLUCAP 139* 66* 108* 87 151*    Discharge time spent: greater than 30 minutes.  Signed: Coralie Keens, MD [facility] 06/17/2023

## 2023-06-17 NOTE — Progress Notes (Signed)
Pt refusing to allow finger stick CBG to be taken.  No insulin coverage ordered at this time. Will continue to monitor.

## 2023-06-17 NOTE — TOC Progression Note (Addendum)
Transition of Care [facility]) - Progression Note    Patient Details  Name: Robin Andrews MRN: [id number] Date of Birth: 1931-07-27  [facility] CM/SW Contact  Leone Haven, RN Phone Number: 06/17/2023, 2:57 PM  Clinical Narrative:    NCM spoke with patient and her grandson , Phi, he asked her if she wanted to go to a SNF, she says yes, she does not want to go home. She states she can go to SNF for 2-3 weeks for short term therapy.  Per Lucila Maine she was staying with her daughter but she left and now lives by her self,  she told her grandson to pay for her rent for July since she will be at the SNF , he said he would do this for her and he will go visit her at the SNF and CSW to call him to let him know which SNF she will be going to, she states it is ok for the CSW to fax out her information.  Lucila Maine is Phi at [phone].    Expected Discharge Plan: Home/Self Care Barriers to Discharge: Continued Medical Work up  Expected Discharge Plan and Services In-house Referral: NA Discharge Planning Services: CM Consult Post Acute Care Choice: NA Living arrangements for the past 2 months: Single Family Home Expected Discharge Date: 06/17/23               DME Arranged: N/A DME Agency: NA       HH Arranged: NA           Social Determinants of Health (SDOH) Interventions SDOH Screenings   Depression (PHQ2-9): Low Risk  (06/27/2020)  Tobacco Use: Low Risk  (12/28/2022)    Readmission Risk Interventions     No data to display

## 2023-06-17 NOTE — Progress Notes (Signed)
Initial Nutrition Assessment  DOCUMENTATION CODES:   Not applicable  INTERVENTION:  Liberalize diet to regular in setting of advanced age and nutrition risk Ensure Enlive po once, each supplement provides 350 kcal and 20 grams of protein. MVI with minerals daily  NUTRITION DIAGNOSIS:   Inadequate oral intake related to acute illness as evidenced by meal completion < 50%.  GOAL:   Patient will meet greater than or equal to 90% of their needs  MONITOR:   PO intake, Supplement acceptance, Labs, Weight trends, Diet advancement  REASON FOR ASSESSMENT:   Consult Assessment of nutrition requirement/status  ASSESSMENT:   Pt admitted with weakness x2 weeks and nausea/vomiting x3 days. PMH significant for HTN, HLD, T2DM and SDH.  Pending SNF placement.  Pt admitted with diagnosis of hyponatremia and dehydration. Workup with findings of AKI, pre-renal.   Attempted to speak with pt at bedside utilizing iPad interpreter [phone]). Initially pt motioning for ipad to be moved closer d/t hearing difficulty. Interpreter typed out information/questions however pt remained with her eyes closed and tearful by the end of visit. Unable to obtain any detailed nutrition related history at this time.   Spoke with pt's RN and NT in hallway. NT reports that 2 days ago she was not eating well. She was eating better yesterday and today per report. Review of documented meal completions reflects pt had 1 good meal for breakfast this morning.   Meal completions:  6/27: 25% lunch, 10% dinner 6/28: 85% breakfast, 25% lunch  Unfortunately, there is limited weight history on file to review within the last year. Pt's weight in 2022 was 39.5 kg.  Pt's weight today documented to be 47.7 kg however has varied widely since day of admit. Question accuracy of documented weights.   Medications: SSI 0-6 units TID  Labs: BUN 57, Cr 2.22, corrected Ca 8.44, Phos 5.6, GFR 21, CBG's 87-151 x24 hours, HgbA1c  5.6%  NUTRITION - FOCUSED PHYSICAL EXAM: Deferred to follow up. Unable to communicate with pt.   Diet Order:   Diet Order             Diet - low sodium heart healthy           Diet Carb Modified Fluid consistency: Thin; Room service appropriate? Yes  Diet effective now                   EDUCATION NEEDS:   Not appropriate for education at this time  Skin:  Skin Assessment: Reviewed RN Assessment  Last BM:  6/28 (type 5 small)  Height:   Ht Readings from Last 1 Encounters:  06/15/23 5\' 3"  (1.6 m)    Weight:   Wt Readings from Last 1 Encounters:  06/18/23 47.7 kg   BMI:  Body mass index is 18.63 kg/m.  Estimated Nutritional Needs:   Kcal:  1300-1500  Protein:  65-75g  Fluid:  >/=1.5L  Drusilla Kanner, RDN, LDN Clinical Nutrition

## 2023-06-17 NOTE — Progress Notes (Signed)
CSW met with pt at bedside. CSW utilized the translator device to communicate with pt regarding STR at SNF. Pt would not engage in conversation about SNF. Pt appeared to be very irritable and continue to say she was cold. CSW made multiple attempts to engage in conversation with pt via translator. CSW will return at a later time to discuss STR at [facility].

## 2023-06-17 NOTE — Inpatient Diabetes Management (Signed)
Inpatient Diabetes Program Recommendations  AACE/ADA: New Consensus Statement on Inpatient Glycemic Control (2015)  Target Ranges:  Prepandial:   less than 140 mg/dL      Peak postprandial:   less than 180 mg/dL (1-2 hours)      Critically ill patients:  140 - 180 mg/dL   Lab Results  Component Value Date   GLUCAP 87 06/17/2023   HGBA1C 5.6 06/16/2023    Review of Glycemic Control  Latest Reference Range & Units 06/16/23 17:04 06/16/23 21:11 06/16/23 21:48 06/17/23 06:08  Glucose-Capillary 70 - 99 mg/dL 161 (H)  Novolog 1 unit 66 (L) 108 (H) 87  (H): Data is abnormally high (L): Data is abnormally low  Latest Reference Range & Units 06/17/23 01:26  GFR, Estimated >60 mL/min 21 (L)  (L): Data is abnormally low  Diabetes history: DM2 Outpatient Diabetes medications: Diet controlled Current orders for Inpatient glycemic control: Novolog 0-9 units TID  Inpatient Diabetes Program Recommendations:    Please consider decreasing correction:  Novolog 0-6 units TID  Will continue to follow while inpatient.  Thank you, Dulce Sellar, MSN, CDCES Diabetes Coordinator Inpatient Diabetes Program [phone] (team pager from 8a-5p)

## 2023-06-18 LAB — GLUCOSE, CAPILLARY
Glucose-Capillary: 150 mg/dL — ABNORMAL HIGH (ref 70–99)
Glucose-Capillary: 147 mg/dL — ABNORMAL HIGH (ref 70–99)
Glucose-Capillary: 180 mg/dL — ABNORMAL HIGH (ref 70–99)

## 2023-06-18 LAB — CULTURE, BLOOD (ROUTINE X 2): Culture: NO GROWTH

## 2023-06-18 MED ORDER — ENSURE ENLIVE PO LIQD
237.0000 mL | ORAL | Status: DC
  Administered 2023-06-18 – 2023-06-20 (×3): 237 mL via ORAL

## 2023-06-18 MED ORDER — AMLODIPINE BESYLATE 5 MG PO TABS
5.0000 mg | ORAL_TABLET | Freq: Every day | ORAL | Status: DC
  Administered 2023-06-19 – 2023-06-21 (×3): 5 mg via ORAL
  Filled 2023-06-18 (×3): qty 1

## 2023-06-18 MED ORDER — ENOXAPARIN SODIUM 30 MG/0.3ML IJ SOSY
30.0000 mg | PREFILLED_SYRINGE | INTRAMUSCULAR | Status: DC
  Administered 2023-06-18 – 2023-06-20 (×3): 30 mg via SUBCUTANEOUS
  Filled 2023-06-18 (×3): qty 0.3

## 2023-06-18 MED ORDER — ADULT MULTIVITAMIN W/MINERALS CH
1.0000 | ORAL_TABLET | Freq: Every day | ORAL | Status: DC
  Administered 2023-06-18 – 2023-06-21 (×4): 1 via ORAL
  Filled 2023-06-18 (×4): qty 1

## 2023-06-18 NOTE — [place] FL2 (Addendum)
[place] MEDICAID FL2 LEVEL OF CARE FORM     IDENTIFICATION  Patient Name: Robin Andrews Birthdate: 01/10/1931 Sex: female Admission Date (Current Location): 06/15/2023  [facility] and [place] Number:  Producer, television/film/video and Address:  [facility], [address] [place], [place] [address]      Provider Number: [id number]  Attending Physician Name and Address:  Coralie Keens,*  Relative Name and Phone Number:  Phing [phone]    Current Level of Care: Hospital Recommended Level of Care: Skilled Nursing Facility Prior Approval Number:    Date Approved/Denied:   PASRR Number: [id number]  Discharge Plan: SNF    Current Diagnoses: Patient Active Problem List   Diagnosis Date Noted   AKI (acute kidney injury) (HCC) 06/16/2023   Iron deficiency anemia 06/16/2023   Acute CHF (congestive heart failure) (HCC) 06/15/2023   Elevated brain natriuretic peptide (BNP) level 06/15/2023   Hematoma of occipital region of scalp 08/11/2021   SDH (subdural hematoma) (HCC) 08/08/2021   Hyponatremia 01/29/2021   Acute metabolic encephalopathy 01/29/2021   Vertigo 04/18/2020   Noninfectious otitis externa of right ear 04/18/2020   Chronic neck pain 11/09/2019   Chronic bilateral low back pain without sciatica 11/09/2019   Hypokalemia    Diabetes mellitus type 2, controlled, without complications (HCC) 12/18/2017   Cardiac murmur 12/18/2017   Cervical disc disease 05/25/2012   Chronic pain syndrome 05/25/2012   Osteoarthritis 05/25/2012   Hyperlipidemia 05/30/2007   HYPERTENSION, BENIGN ESSENTIAL 05/30/2007   ASTEATOTIC ECZEMA 05/30/2007    Orientation RESPIRATION BLADDER Height & Weight     Self, Time, Situation, Place  O2 (2 liters) Continent Weight: 105 lb 2.6 oz (47.7 kg) Height:  5\' 3"  (160 cm)  BEHAVIORAL SYMPTOMS/MOOD NEUROLOGICAL BOWEL NUTRITION STATUS      Continent Diet (See dc summary)  AMBULATORY STATUS COMMUNICATION OF NEEDS Skin    Limited Assist Verbally (Speaks [place] Normal                       Personal Care Assistance Level of Assistance  Feeding, Bathing, Dressing Bathing Assistance: Limited assistance Feeding assistance: Independent Dressing Assistance: Limited assistance     Functional Limitations Info  Sight, Hearing, Speech Sight Info: Adequate Hearing Info: Adequate Speech Info: Adequate    SPECIAL CARE FACTORS FREQUENCY  PT (By licensed PT), OT (By licensed OT)     PT Frequency: 5xweek OT Frequency: 5xweek            Contractures Contractures Info: Not present    Additional Factors Info  Code Status, Allergies Code Status Info: Full Allergies Info: Hydrochlorothiazide  Lisinopril           Current Medications (06/18/2023):  This is the current hospital active medication list Current Facility-Administered Medications  Medication Dose Route Frequency Provider Last Rate Last Admin   acetaminophen (TYLENOL) tablet 650 mg  650 mg Oral Q6H PRN Synetta Fail, MD   650 mg at 06/16/23 1210   Or   acetaminophen (TYLENOL) suppository 650 mg  650 mg Rectal Q6H PRN Synetta Fail, MD       heparin injection 5,000 Units  5,000 Units Subcutaneous Q8H Synetta Fail, MD   5,000 Units at 06/17/23 2134   insulin aspart (novoLOG) injection 0-6 Units  0-6 Units Subcutaneous TID WC Arrien, York Ram, MD   1 Units at 06/17/23 1130   polyethylene glycol (MIRALAX / GLYCOLAX) packet 17 g  17 g  Oral Daily PRN Synetta Fail, MD       sodium chloride flush (NS) 0.9 % injection 3 mL  3 mL Intravenous Q12H Synetta Fail, MD   3 mL at 06/18/23 1610     Discharge Medications: Please see discharge summary for a list of discharge medications.  Relevant Imaging Results:  Relevant Lab Results:   Additional Information SSN: [id number] Speaks Vietnamese   Oletta Lamas, MSW, LCSWA, LCASA Transitions of Care  Clinical Social Worker I

## 2023-06-18 NOTE — Progress Notes (Addendum)
Progress Note   Patient: Robin Andrews GNF:[id number] DOB: [birth year] DOA: 06/15/2023     2 DOS: the patient was seen and examined on 06/18/2023   Brief hospital course: Robin Andrews was admitted to the hospital with the working diagnosis of dehydration and hyponatremia.   89 yo female with the past medical history of hypertension, hyperlipidemia, T2DM, and subdural hematoma who presented with weakness. Difficult to obtain history due to language barrier. Patient not feeling well at home, her neighbor called EMS. She was found hypotensive with systolic blood pressure in the 90's, received 200 cc IV fluid bolus and was transported to the ED. Apparently she took at home unintentionally 5 tablets of amlodipine 5 mg at once.   On her initial physical examination her blood pressure was 127/47, HR 58, RR 22 and 02 saturation 100%, lungs with no wheezing or rales, heart with S1 and S2 present and regular with no gallops, rubs or murmurs, abdomen with no distention and no lower extremity edema.   VBG 7,44/ 22/ 115/ 16/ 99%   Na 128, K 4,0 Cl 96, bicarbonate 15, glucose 262, bun 68 cr 2,87.  Anion gap 17  Mg 3.1  BNP 1,495 High sensitive troponin 13 Lactic acid 2,4  Wbc 7,6 hgb 8,7 plt 354  Urine analysis SG 1,016, negative protein, negative glucose, negative hgb and negative leukocytes. pH 5.0   Chest radiograph with cardiomegaly, bilateral hilar vascular congestion and mild cephalization of the vasculature, fluid in the right fissure.    EKG 54 bpm, normal axis, normal intervals, sinus rhythm with low voltage, no significant ST segment changes, negative T wave V3 to V6.   Echocardiogram with preserved LV systolic function. Heart failure was ruled out.  Patient responded well to IV fluids.   Patient has recovered well with improvement in blood pressure, electrolytes and renal function.  Continue very weak and deconditioned, plan to transfer to SNF.   Assessment and Plan: * AKI (acute kidney  injury) (HCC) Hyponatremia, anion gap metabolic acidosis, hyperphosphatemia.  Pre renal renal failure, she may have some component of chronic renal disease.   Urine osmolality 410, with urinary Na less than 10. Urine K 23 and Cl <15.  Renal US with no hydronephrosis, bilateral pleural effusions.  Incidental sludge and stones in the gallbladder.   Patient was placed on IV isotonic saline with improvement in her volume status and electrolytes. Follow up renal function in am.   Heart failure has been ruled out.  Echocardiogram with preserved LV systolic function with EF 65 to 70%, with no LVH, RV systolic function preserved. No atrial dilatation, moderate tricuspid regurgitation.   Acute metabolic encephalopathy likely multifactorial, including hyponatremia and dehydration.  Her mentation is back to he baseline.    Diabetes mellitus type 2, controlled, without complications (HCC) Initially hyperglycemic, with improvement with insulin sliding scale.  Fasting glucose at the time of her discharge is 92 mg/dl.  It is possible she had some component of ketosis on admission. Will need close follow up as outpatient.   Hyperlipidemia Continue statin therapy.  HYPERTENSION, BENIGN ESSENTIAL Will resume amlodipine for blood pressure control.   Iron deficiency anemia Iron panel with serum iron 26, TIBC 265, ferritin 100, transferrin 190 and transferrin saturation 10.  Consistent with anemia of chronic disease with iron deficiency.   AP IV iron infusion with ferumoxytol 500 mg. Follow up iron panel as outpatient.         Subjective: Patient with no chest pain or dyspnea,  tolerating po well, continue very weak and deconditioned   Physical Exam: Vitals:   06/18/23 0429 06/18/23 0500 06/18/23 0722 06/18/23 1132  BP:   (!) 143/86 (!) 155/54  Pulse:   72 76  Resp: 20  20 (!) 21  Temp:   98.5 F (36.9 C) 98.3 F (36.8 C)  TempSrc:   Oral Oral  SpO2:    92%  Weight:  47.7 kg     Height:       Neurology awake and alert ENT with mild pallor Cardiovascular with S1 and S2 present and rhythmic Respiratory with no rales or wheezing Abdomen with no distention  No lower extremity edema  Data Reviewed:    Family Communication: no family at the bedside   Disposition: Status is: Inpatient Remains inpatient appropriate because: pending placement.   Planned Discharge Destination: Home    Author: Coralie Keens, MD 06/18/2023 3:23 PM  For on call review [email or web address].

## 2023-06-18 NOTE — Care Management Important Message (Signed)
Important Message  Patient Details  Name: Robin Andrews MRN: [id number] Date of Birth: July 10, 1931   Medicare Important Message Given:  Yes     Renie Ora 06/18/2023, 8:40 AM

## 2023-06-18 NOTE — TOC Progression Note (Signed)
Transition of Care [facility]) - Progression Note    Patient Details  Name: Robin Andrews MRN: [id number] Date of Birth: 07/25/1931  [facility]) CM/SW Contact  Leander Rams, LCSW Phone Number: 06/18/2023, 12:29 PM  Clinical Narrative:    CSW spoke with pt grandson Phing. Phing states they strongly report pt remain in [place] for STR.   CSW completed fl2 and faxed out. Pending bed offers. Phing states he can come to the hospital tomorrow to further discuss SNF. Bed offers are pending and will need to be given to pt/family tomorrow.   TOC will continue to follow.   Expected Discharge Plan: Home/Self Care Barriers to Discharge: Continued Medical Work up  Expected Discharge Plan and Services In-house Referral: NA Discharge Planning Services: CM Consult Post Acute Care Choice: NA Living arrangements for the past 2 months: Single Family Home Expected Discharge Date: 06/17/23               DME Arranged: N/A DME Agency: NA       HH Arranged: NA           Social Determinants of Health (SDOH) Interventions SDOH Screenings   Depression (PHQ2-9): Low Risk  (06/27/2020)  Tobacco Use: Low Risk  (12/28/2022)    Readmission Risk Interventions     No data to display         Oletta Lamas, MSW, LCSWA, LCASA Transitions of Care  Clinical Social Worker I

## 2023-06-18 NOTE — Progress Notes (Signed)
Physical Therapy Treatment Patient Details Name: Robin Andrews MRN: [id number] DOB: 08-Aug-1931 Today's Date: 06/18/2023   History of Present Illness Pt is a 89 y/o F presenting to ED on 6/25 with weakness, admitted for AKI; initially working dx of CHF but has been ruled out. PMH incldues HTN, HLD, diabetes, SDH, chronic pain    PT Comments    Pt continues to self limit on mobility. Assisted pt back to bed from bsc but refused any further mobility. Will continue attempts.    Recommendations for follow up therapy are one component of a multi-disciplinary discharge planning process, led by the attending physician.  Recommendations may be updated based on patient status, additional functional criteria and insurance authorization.  Follow Up Recommendations       Assistance Recommended at Discharge Frequent or constant Supervision/Assistance  Patient can return home with the following A little help with walking and/or transfers;A little help with bathing/dressing/bathroom;Direct supervision/assist for financial management;Help with stairs or ramp for entrance;Assist for transportation;Assistance with cooking/housework   Equipment Recommendations  Rolling walker (2 wheels)    Recommendations for Other Services       Precautions / Restrictions Precautions Precautions: Fall Restrictions Weight Bearing Restrictions: No     Mobility  Bed Mobility Overal bed mobility: Needs Assistance Bed Mobility: Sit to Supine       Sit to supine: Supervision        Transfers Overall transfer level: Needs assistance Equipment used: 1 person hand held assist Transfers: Sit to/from Stand, Bed to chair/wheelchair/BSC Sit to Stand: Min assist   Step pivot transfers: Min assist       General transfer comment: Assist for balance. BSC to be with assist for stability as stepping around    Ambulation/Gait               General Gait Details: Pt adamantly refusing any  ambulation   Stairs             Wheelchair Mobility    Modified Rankin (Stroke Patients Only)       Balance Overall balance assessment: Needs assistance Sitting-balance support: No upper extremity supported, Feet supported Sitting balance-Leahy Scale: Fair     Standing balance support: Single extremity supported Standing balance-Leahy Scale: Poor                              Cognition Arousal/Alertness: Awake/alert Behavior During Therapy: Impulsive Overall Cognitive Status: Difficult to assess                                 General Comments: Difficult to assess due to distraction even with use of interpreter.        Exercises      General Comments General comments (skin integrity, edema, etc.): Used video interpreter Dia Sitter [phone]      Pertinent Vitals/Pain Pain Assessment Pain Assessment: Faces Faces Pain Scale: Hurts a little bit Pain Location: Legs Pain Intervention(s): Patient requesting pain meds-RN notified, Repositioned    Home Living                          Prior Function            PT Goals (current goals can now be found in the care plan section) Progress towards PT goals: Not progressing toward goals - comment (self limiting)  Frequency    Min 1X/week      PT Plan Current plan remains appropriate    Co-evaluation              AM-PAC PT "6 Clicks" Mobility   Outcome Measure  Help needed turning from your back to your side while in a flat bed without using bedrails?: None Help needed moving from lying on your back to sitting on the side of a flat bed without using bedrails?: A Little Help needed moving to and from a bed to a chair (including a wheelchair)?: A Little Help needed standing up from a chair using your arms (e.g., wheelchair or bedside chair)?: A Little Help needed to walk in hospital room?: Total Help needed climbing 3-5 steps with a railing? : Total 6 Click Score:  15    End of Session   Activity Tolerance: Other (comment) (pt self limiting) Patient left: in bed;with call bell/phone within reach;with bed alarm set Nurse Communication: Mobility status;Patient requests pain meds PT Visit Diagnosis: Other abnormalities of gait and mobility (R26.89)     Time: 1610-9604 PT Time Calculation (min) (ACUTE ONLY): 12 min  Charges:  $Therapeutic Activity: 8-22 mins                     [facility] PT Acute Rehabilitation Services Office [phone]    Angelina Ok [facility] 06/18/2023, 12:46 PM

## 2023-06-19 DIAGNOSIS — E1169 Type 2 diabetes mellitus with other specified complication: Secondary | ICD-10-CM

## 2023-06-19 LAB — GLUCOSE, CAPILLARY
Glucose-Capillary: 114 mg/dL — ABNORMAL HIGH (ref 70–99)
Glucose-Capillary: 114 mg/dL — ABNORMAL HIGH (ref 70–99)
Glucose-Capillary: 140 mg/dL — ABNORMAL HIGH (ref 70–99)
Glucose-Capillary: 153 mg/dL — ABNORMAL HIGH (ref 70–99)

## 2023-06-19 LAB — BASIC METABOLIC PANEL
Anion gap: 10 (ref 5–15)
BUN: 50 mg/dL — ABNORMAL HIGH (ref 8–23)
CO2: 18 mmol/L — ABNORMAL LOW (ref 22–32)
Calcium: 8.6 mg/dL — ABNORMAL LOW (ref 8.9–10.3)
Chloride: 111 mmol/L (ref 98–111)
Creatinine, Ser: 2.04 mg/dL — ABNORMAL HIGH (ref 0.44–1.00)
GFR, Estimated: 23 mL/min — ABNORMAL LOW (ref >60)
Glucose, Bld: 141 mg/dL — ABNORMAL HIGH (ref 70–99)
Potassium: 4.2 mmol/L (ref 3.5–5.1)
Sodium: 139 mmol/L (ref 135–145)

## 2023-06-19 LAB — CULTURE, BLOOD (ROUTINE X 2)

## 2023-06-19 NOTE — Progress Notes (Addendum)
Patient's grandson and family at bedside. Grandson stated that he met with Child psychotherapist and was given a list of SNF to select from.

## 2023-06-19 NOTE — Progress Notes (Signed)
Progress Note   Patient: Robin Andrews:[id number] DOB: November 05, 1931 DOA: 06/15/2023     3 DOS: the patient was seen and examined on 06/19/2023   Brief hospital course: Robin Andrews was admitted to the hospital with the working diagnosis of dehydration and hyponatremia.   89 yo female with the past medical history of hypertension, hyperlipidemia, T2DM, and subdural hematoma who presented with weakness. Difficult to obtain history due to language barrier. Patient not feeling well at home, her neighbor called EMS. She was found hypotensive with systolic blood pressure in the 90's, received 200 cc IV fluid bolus and was transported to the ED. Apparently she took at home unintentionally 5 tablets of amlodipine 5 mg at once.   On her initial physical examination her blood pressure was 127/47, HR 58, RR 22 and 02 saturation 100%, lungs with no wheezing or rales, heart with S1 and S2 present and regular with no gallops, rubs or murmurs, abdomen with no distention and no lower extremity edema.   VBG 7,44/ 22/ 115/ 16/ 99%   Na 128, K 4,0 Cl 96, bicarbonate 15, glucose 262, bun 68 cr 2,87.  Anion gap 17  Mg 3.1  BNP 1,495 High sensitive troponin 13 Lactic acid 2,4  Wbc 7,6 hgb 8,7 plt 354  Urine analysis SG 1,016, negative protein, negative glucose, negative hgb and negative leukocytes. pH 5.0   Chest radiograph with cardiomegaly, bilateral hilar vascular congestion and mild cephalization of the vasculature, fluid in the right fissure.    EKG 54 bpm, normal axis, normal intervals, sinus rhythm with low voltage, no significant ST segment changes, negative T wave V3 to V6.   Echocardiogram with preserved LV systolic function. Heart failure was ruled out.  Patient responded well to IV fluids.   Patient has recovered well with improvement in blood pressure, electrolytes and renal function.  Continue very weak and deconditioned, plan to transfer to SNF.   Assessment and Plan: * AKI (acute kidney  injury) (HCC) Hyponatremia, anion gap metabolic acidosis, hyperphosphatemia.  Pre renal renal failure, she may have some component of chronic renal disease.   Urine osmolality 410, with urinary Na less than 10. Urine K 23 and Cl <15.  Renal US with no hydronephrosis, bilateral pleural effusions.  Incidental sludge and stones in the gallbladder.   Patient was placed on IV isotonic saline with improvement in her volume status and electrolytes.  Renal function with serum cr at 2.0 with K at 4,2 and serum bicarbonate at 18. Anion gap 10, Na 139   Heart failure has been ruled out.  Echocardiogram with preserved LV systolic function with EF 65 to 70%, with no LVH, RV systolic function preserved. No atrial dilatation, moderate tricuspid regurgitation.   Acute metabolic encephalopathy likely multifactorial, including hyponatremia and dehydration, now resolved.   Type 2 diabetes mellitus with hyperlipidemia (HCC) Initially hyperglycemic, with improvement with insulin sliding scale.   It is possible she had some component of ketosis on admission. Will need close follow up as outpatient.   Today fasting glucose is 141 mg/dl.   Continue with statin therapy.   HYPERTENSION, BENIGN ESSENTIAL Continue amlodipine for blood pressure control.   Iron deficiency anemia Iron panel with serum iron 26, TIBC 265, ferritin 100, transferrin 190 and transferrin saturation 10.  Consistent with anemia of chronic disease with iron deficiency.   AP IV iron infusion with ferumoxytol 500 mg. Follow up iron panel as outpatient.         Subjective: Patient with  no dyspnea, no pain and tolerating po well.   Physical Exam: Vitals:   06/18/23 2300 06/19/23 0500 06/19/23 0719 06/19/23 1130  BP: (!) 144/52 (!) 142/49 (!) 155/57 (!) 146/58  Pulse: 67 71 76 74  Resp: 17 18 20  (!) 25  Temp: 98.5 F (36.9 C) 98.5 F (36.9 C) 98 F (36.7 C)   TempSrc: Oral Oral Oral Oral  SpO2: 91% 91% 92% 92%  Weight:   47.3 kg    Height:       Neurology awake and alert ENT with no pallor Cardiovascular with S1 and S2 present and rhythmic Respiratory with no wheezing Abdomen with no distention  Data Reviewed:    Family Communication: no family at the bedside   Disposition: Status is: Inpatient Remains inpatient appropriate because: pending transfer to SNF  Planned Discharge Destination: Skilled nursing facility     Author: Coralie Keens, MD 06/19/2023 12:03 PM  For on call review [email or web address].

## 2023-06-19 NOTE — TOC Progression Note (Addendum)
Transition of Care [facility]) - Progression Note    Patient Details  Name: Robin Andrews MRN: [id number] Date of Birth: 02/21/1931  Transition of Care [facility]) CM/SW Contact  Donnalee Curry, LCSWA Phone Number: 06/19/2023, 12:00 PM  Clinical Narrative:     SW attempted to reach grandson Robin Andrews [phone]) but straight to VM. VM left.  Update 200pm SW met with pt, pt's daughter, SIL and grandson Robin Andrews at bedside. Robin Andrews provided translation. SW discussed current offers. Pt family requested Robin Andrews. SW explained insurance auth process. Robin Andrews verbalized understanding.   Per [employer] Portal Auth approved  Auth #:[id number] next review 7/3  SW spoke with Colin Mulders [facility] Lucas Mallow [phone]) will f/u with SW regarding availability.    Expected Discharge Plan: Home/Self Care Barriers to Discharge: Continued Medical Work up  Expected Discharge Plan and Services In-house Referral: NA Discharge Planning Services: CM Consult Post Acute Care Choice: NA Living arrangements for the past 2 months: Single Family Home Expected Discharge Date: 06/17/23               DME Arranged: N/A DME Agency: NA       HH Arranged: NA           Social Determinants of Health (SDOH) Interventions SDOH Screenings   Depression (PHQ2-9): Low Risk  (06/27/2020)  Tobacco Use: Low Risk  (12/28/2022)    Readmission Risk Interventions     No data to display

## 2023-06-19 NOTE — Progress Notes (Addendum)
Interpreter used for morning assessment and medication administration at this time.

## 2023-06-20 LAB — GLUCOSE, CAPILLARY
Glucose-Capillary: 112 mg/dL — ABNORMAL HIGH (ref 70–99)
Glucose-Capillary: 124 mg/dL — ABNORMAL HIGH (ref 70–99)
Glucose-Capillary: 124 mg/dL — ABNORMAL HIGH (ref 70–99)
Glucose-Capillary: 126 mg/dL — ABNORMAL HIGH (ref 70–99)

## 2023-06-20 LAB — CULTURE, BLOOD (ROUTINE X 2): Culture: NO GROWTH

## 2023-06-20 NOTE — Progress Notes (Signed)
Progress Note   Patient: Robin Andrews QIO:[id number] DOB: June 05, 1931 DOA: 06/15/2023     4 DOS: the patient was seen and examined on 06/20/2023   Brief hospital course: Mrs. Legate was admitted to the hospital with the working diagnosis of dehydration and hyponatremia.   89 yo female with the past medical history of hypertension, hyperlipidemia, T2DM, and subdural hematoma who presented with weakness. Difficult to obtain history due to language barrier. Patient not feeling well at home, her neighbor called EMS. She was found hypotensive with systolic blood pressure in the 90's, received 200 cc IV fluid bolus and was transported to the ED. Apparently she took at home unintentionally 5 tablets of amlodipine 5 mg at once.   On her initial physical examination her blood pressure was 127/47, HR 58, RR 22 and 02 saturation 100%, lungs with no wheezing or rales, heart with S1 and S2 present and regular with no gallops, rubs or murmurs, abdomen with no distention and no lower extremity edema.   VBG 7,44/ 22/ 115/ 16/ 99%   Na 128, K 4,0 Cl 96, bicarbonate 15, glucose 262, bun 68 cr 2,87.  Anion gap 17  Mg 3.1  BNP 1,495 High sensitive troponin 13 Lactic acid 2,4  Wbc 7,6 hgb 8,7 plt 354  Urine analysis SG 1,016, negative protein, negative glucose, negative hgb and negative leukocytes. pH 5.0   Chest radiograph with cardiomegaly, bilateral hilar vascular congestion and mild cephalization of the vasculature, fluid in the right fissure.    EKG 54 bpm, normal axis, normal intervals, sinus rhythm with low voltage, no significant ST segment changes, negative T wave V3 to V6.   Echocardiogram with preserved LV systolic function. Heart failure was ruled out.  Patient responded well to IV fluids.   Patient has recovered well with improvement in blood pressure, electrolytes and renal function.  Continue very weak and deconditioned, plan to transfer to SNF.   Assessment and Plan: * AKI (acute kidney  injury) (HCC) Hyponatremia, anion gap metabolic acidosis, hyperphosphatemia.  Pre renal renal failure, she may have some component of chronic renal disease.   Urine osmolality 410, with urinary Na less than 10. Urine K 23 and Cl <15.  Renal US with no hydronephrosis, bilateral pleural effusions.  Incidental sludge and stones in the gallbladder.   Patient was placed on IV isotonic saline with improvement in her volume status and electrolytes.  Heart failure has been ruled out.  Echocardiogram with preserved LV systolic function with EF 65 to 70%, with no LVH, RV systolic function preserved. No atrial dilatation, moderate tricuspid regurgitation.   Acute metabolic encephalopathy likely multifactorial, including hyponatremia and dehydration, now resolved.   Type 2 diabetes mellitus with hyperlipidemia (HCC) Initially hyperglycemic, with improvement with insulin sliding scale.   It is possible she had some component of ketosis on admission. Will need close follow up as outpatient.   Continue with statin therapy.   HYPERTENSION, BENIGN ESSENTIAL Continue amlodipine for blood pressure control.   Iron deficiency anemia Iron panel with serum iron 26, TIBC 265, ferritin 100, transferrin 190 and transferrin saturation 10.  Consistent with anemia of chronic disease with iron deficiency.   AP IV iron infusion with ferumoxytol 500 mg. Follow up iron panel as outpatient.         Subjective: Patient with no chest pain or dyspnea, she has been thirsty, apparently not eating very well.   Physical Exam: Vitals:   06/20/23 0000 06/20/23 0134 06/20/23 0310 06/20/23 0315  BP: Marland Kitchen)  146/52  (!) 160/63 (!) 149/47  Pulse: 75  80 81  Resp: 20  19 19   Temp: 99 F (37.2 C)  98.6 F (37 C) 98.6 F (37 C)  TempSrc: Oral   Oral  SpO2: 93%  90% 90%  Weight:  46.9 kg    Height:       Neurology awake and alert ENT with mild pallor Cardiovascular with S1 and S2 present and regular Respiratory  with no wheezing or rales Abdomen with no distention  No lower extremity edema  Data Reviewed:    Family Communication: no family at the bedside   Disposition: Status is: Inpatient Remains inpatient appropriate because: pending transfer to SNF  Planned Discharge Destination: Skilled nursing facility      Author: Coralie Keens, MD 06/20/2023 1:30 PM  For on call review [email or web address].

## 2023-06-21 LAB — GLUCOSE, CAPILLARY
Glucose-Capillary: 113 mg/dL — ABNORMAL HIGH (ref 70–99)
Glucose-Capillary: 129 mg/dL — ABNORMAL HIGH (ref 70–99)

## 2023-06-21 MED ORDER — ADULT MULTIVITAMIN W/MINERALS CH: 1.0000 | ORAL_TABLET | Freq: Every day | ORAL | 0 refills | Status: DC

## 2023-06-21 MED ORDER — ENSURE ENLIVE PO LIQD: 237.0000 mL | ORAL | 0 refills | Status: AC

## 2023-06-21 NOTE — TOC Transition Note (Signed)
Transition of Care [facility]) - CM/SW Discharge Note   Patient Details  Name: Robin Andrews MRN: [id number] Date of Birth: 1931-09-30  Transition of Care [facility]) CM/SW Contact:  Leander Rams, LCSW Phone Number: 06/21/2023, 11:51 AM   Clinical Narrative:    [facility] Anticipated DC date: 06/21/2023 Family notified: [facility] Transport by: Sharin Mons   Per MD patient ready for DC to [facility] SNF. RN, patient, patient's family, and facility notified of DC. Discharge Summary and FL2 sent to facility. RN to call report prior to discharge [phone]. DC packet on chart. Ambulance transport requested for patient.   CSW will sign off for now as social work intervention is no longer needed. Please consult [place] again if new needs arise.    Final next level of care: Skilled Nursing Facility Barriers to Discharge: No Barriers Identified   Patient Goals and CMS Choice   Choice offered to / list presented to : NA  Discharge Placement                Patient chooses bed at: [facility] Patient to be transferred to facility by: [facility] Name of family member notified: Phi Patient and family notified of of transfer: 06/21/23  Discharge Plan and Services Additional resources added to the After Visit Summary for   In-house Referral: NA Discharge Planning Services: CM Consult Post Acute Care Choice: NA          DME Arranged: N/A DME Agency: NA       HH Arranged: NA          Social Determinants of Health (SDOH) Interventions SDOH Screenings   Depression (PHQ2-9): Low Risk  (06/27/2020)  Tobacco Use: Low Risk  (12/28/2022)     Readmission Risk Interventions     No data to display          Oletta Lamas, MSW, LCSWA, LCASA Transitions of Care  Clinical Social Worker I

## 2023-06-21 NOTE — Progress Notes (Signed)
Patient ID: Robin Andrews, female   DOB: 1931-01-23, 89 y.o.   MRN: [id number]  Report called to [facility] and given to [facility] LPN. No further questions at this time.  Lidia Collum, RN

## 2023-06-21 NOTE — Progress Notes (Signed)
Patient medically ready for discharge to SNF. For details please see updated discharge summary.

## 2023-06-21 NOTE — Progress Notes (Signed)
Patient ID: Robin Andrews, female   DOB: May 17, 1931, 89 y.o.   MRN: [id number]   Attempted to call report to [facility]. No answer on nursing hall after call transferred.  Lidia Collum, RN

## 2023-06-21 NOTE — Plan of Care (Signed)
Patient ID: Robin Andrews, female   DOB: 02-05-1931, 89 y.o.   MRN: [id number]  Problem: Education: Goal: Ability to demonstrate management of disease process will improve Outcome: Adequate for Discharge Goal: Ability to verbalize understanding of medication therapies will improve Outcome: Adequate for Discharge Goal: Individualized Educational Video(s) Outcome: Adequate for Discharge   Problem: Activity: Goal: Capacity to carry out activities will improve Outcome: Adequate for Discharge   Problem: Cardiac: Goal: Ability to achieve and maintain adequate cardiopulmonary perfusion will improve Outcome: Adequate for Discharge   Problem: Education: Goal: Knowledge of General Education information will improve Description: Including pain rating scale, medication(s)/side effects and non-pharmacologic comfort measures Outcome: Adequate for Discharge   Problem: Health Behavior/Discharge Planning: Goal: Ability to manage health-related needs will improve Outcome: Adequate for Discharge   Problem: Clinical Measurements: Goal: Ability to maintain clinical measurements within normal limits will improve Outcome: Adequate for Discharge Goal: Will remain free from infection Outcome: Adequate for Discharge Goal: Diagnostic test results will improve Outcome: Adequate for Discharge Goal: Respiratory complications will improve Outcome: Adequate for Discharge Goal: Cardiovascular complication will be avoided Outcome: Adequate for Discharge   Problem: Activity: Goal: Risk for activity intolerance will decrease Outcome: Adequate for Discharge   Problem: Nutrition: Goal: Adequate nutrition will be maintained Outcome: Adequate for Discharge   Problem: Coping: Goal: Level of anxiety will decrease Outcome: Adequate for Discharge   Problem: Elimination: Goal: Will not experience complications related to bowel motility Outcome: Adequate for Discharge Goal: Will not experience complications  related to urinary retention Outcome: Adequate for Discharge   Problem: Pain Managment: Goal: General experience of comfort will improve Outcome: Adequate for Discharge   Problem: Safety: Goal: Ability to remain free from injury will improve Outcome: Adequate for Discharge   Problem: Skin Integrity: Goal: Risk for impaired skin integrity will decrease Outcome: Adequate for Discharge   Problem: Education: Goal: Ability to describe self-care measures that may prevent or decrease complications (Diabetes Survival Skills Education) will improve Outcome: Adequate for Discharge Goal: Individualized Educational Video(s) Outcome: Adequate for Discharge   Problem: Coping: Goal: Ability to adjust to condition or change in health will improve Outcome: Adequate for Discharge   Problem: Fluid Volume: Goal: Ability to maintain a balanced intake and output will improve Outcome: Adequate for Discharge   Problem: Health Behavior/Discharge Planning: Goal: Ability to identify and utilize available resources and services will improve Outcome: Adequate for Discharge Goal: Ability to manage health-related needs will improve Outcome: Adequate for Discharge   Problem: Metabolic: Goal: Ability to maintain appropriate glucose levels will improve Outcome: Adequate for Discharge   Problem: Nutritional: Goal: Maintenance of adequate nutrition will improve Outcome: Adequate for Discharge Goal: Progress toward achieving an optimal weight will improve Outcome: Adequate for Discharge   Problem: Skin Integrity: Goal: Risk for impaired skin integrity will decrease Outcome: Adequate for Discharge   Problem: Tissue Perfusion: Goal: Adequacy of tissue perfusion will improve Outcome: Adequate for Discharge   Problem: Acute Rehab OT Goals (only OT should resolve) Goal: Pt. Will Perform Grooming Outcome: Adequate for Discharge Goal: Pt. Will Perform Upper Body Dressing Outcome: Adequate for  Discharge Goal: Pt. Will Perform Lower Body Dressing Outcome: Adequate for Discharge Goal: Pt. Will Transfer To Toilet Outcome: Adequate for Discharge   Problem: Acute Rehab PT Goals(only PT should resolve) Goal: Pt Will Go Supine/Side To Sit Outcome: Adequate for Discharge Goal: Patient Will Transfer Sit To/From Stand Outcome: Adequate for Discharge Goal: Pt Will Perform Standing Balance Or Pre-Gait Outcome: Adequate for  Discharge Goal: Pt Will Ambulate Outcome: Adequate for Discharge   Problem: Inadequate Intake (NI-2.1) Goal: Food and/or nutrient delivery Description: Individualized approach for food/nutrient provision. Outcome: Adequate for Discharge    Lidia Collum, RN

## 2023-06-21 NOTE — Progress Notes (Signed)
  Mobility Specialist Progress Note:       06/21/23 1003  Mobility  Activity Transferred to/from [facility]  Level of Assistance Minimal assist, patient does 75% or more  Assistive Device Other (Comment) (HHA)  Activity Response Tolerated well  Mobility Referral Yes  $Mobility charge 1 Mobility  Mobility Specialist Start Time (ACUTE ONLY) [id number]  Mobility Specialist Stop Time (ACUTE ONLY) [id number]  Mobility Specialist Time Calculation (min) (ACUTE ONLY) 9 min    Pt found transitioningto BSC, bed alarm going off. MS assisted her to [facility] MinA, HHA. Pt was able to have a BM, assisted in pericare. Pt assisted back to bed w/ call bell in hand, all needs met. Bed alarm is on.

## 2023-06-21 NOTE — Progress Notes (Signed)
Physical Therapy Treatment Patient Details Name: Robin Andrews MRN: [id number] DOB: 1931-02-07 Today's Date: 06/21/2023   History of Present Illness Pt is a 89 y/o F presenting to ED on 6/25 with weakness, admitted for AKI; initially working dx of CHF but has been ruled out. PMH incldues HTN, HLD, diabetes, SDH, chronic pain    PT Comments    Pt received up in hallway with NT assisting, pt expressing she wants to walk. Pt mobilizing well with use of RW and steadying assist, pt reporting fatigue and dyspnea with mobility (VSS on RA). Pt tolerated UE and LE exercises well. Plan remains appropriate.      Recommendations for follow up therapy are one component of a multi-disciplinary discharge planning process, led by the attending physician.  Recommendations may be updated based on patient status, additional functional criteria and insurance authorization.  Follow Up Recommendations       Assistance Recommended at Discharge Frequent or constant Supervision/Assistance  Patient can return home with the following A little help with walking and/or transfers;A little help with bathing/dressing/bathroom;Direct supervision/assist for financial management;Help with stairs or ramp for entrance;Assist for transportation;Assistance with cooking/housework   Equipment Recommendations  Rolling walker (2 wheels)    Recommendations for Other Services       Precautions / Restrictions Precautions Precautions: Fall Restrictions Weight Bearing Restrictions: No     Mobility  Bed Mobility Overal bed mobility: Needs Assistance Bed Mobility: Sit to Supine       Sit to supine: Min assist   General bed mobility comments: light trunk assist to return to supine, repositioning    Transfers Overall transfer level: Needs assistance Equipment used: Rolling walker (2 wheels) Transfers: Sit to/from Stand Sit to Stand: Min assist           General transfer comment: light rise and steadying assist,  stand x2 from chair in room and hallway.    Ambulation/Gait Ambulation/Gait assistance: Min assist Gait Distance (Feet): 60 Feet (x2 - seated rest break) Assistive device: Rolling walker (2 wheels) Gait Pattern/deviations: Step-through pattern, Decreased stride length Gait velocity: decr     General Gait Details: steadying assist   Stairs             Wheelchair Mobility    Modified Rankin (Stroke Patients Only)       Balance Overall balance assessment: Needs assistance Sitting-balance support: No upper extremity supported, Feet supported Sitting balance-Leahy Scale: Fair     Standing balance support: Bilateral upper extremity supported, Reliant on assistive device for balance Standing balance-Leahy Scale: Poor                              Cognition Arousal/Alertness: Awake/alert Behavior During Therapy: Impulsive Overall Cognitive Status: Difficult to assess                                 General Comments: pt up in hallway upon arrival to room so no interpreter utilized, pt follows simple english commands        Exercises General Exercises - Lower Extremity Long Arc Quad: AROM, Both, 15 reps, Seated Shoulder Exercises Shoulder Flexion: AROM, Both, 10 reps, Seated    General Comments        Pertinent Vitals/Pain Pain Assessment Pain Assessment: Faces Faces Pain Scale: Hurts a little bit Pain Location: chest wall from dyspnea Pain Descriptors / Indicators: Discomfort Pain Intervention(s):  Monitored during session, Limited activity within patient's tolerance    Home Living                          Prior Function            PT Goals (current goals can now be found in the care plan section) Acute Rehab PT Goals PT Goal Formulation: With patient Time For Goal Achievement: 06/30/23 Potential to Achieve Goals: Good Progress towards PT goals: Progressing toward goals    Frequency    Min 1X/week       PT Plan Current plan remains appropriate    Co-evaluation              AM-PAC PT "6 Clicks" Mobility   Outcome Measure  Help needed turning from your back to your side while in a flat bed without using bedrails?: A Little Help needed moving from lying on your back to sitting on the side of a flat bed without using bedrails?: A Little Help needed moving to and from a bed to a chair (including a wheelchair)?: A Little Help needed standing up from a chair using your arms (e.g., wheelchair or bedside chair)?: A Little Help needed to walk in hospital room?: A Little Help needed climbing 3-5 steps with a railing? : A Lot 6 Click Score: 17    End of Session   Activity Tolerance: Patient limited by fatigue Patient left: in bed;with bed alarm set;with call bell/phone within reach Nurse Communication: Mobility status PT Visit Diagnosis: Other abnormalities of gait and mobility (R26.89)     Time: 3086-5784 PT Time Calculation (min) (ACUTE ONLY): 11 min  Charges:  $Therapeutic Activity: 8-22 mins                     Marye Round, PT DPT Acute Rehabilitation Services Secure Chat Preferred  Office [phone]    Verl Kitson E Christain [place] 06/21/2023, 11:28 AM

## 2023-09-06 ENCOUNTER — Encounter: Payer: Self-pay | Admitting: Podiatry

## 2023-09-06 ENCOUNTER — Telehealth: Payer: Self-pay

## 2023-09-06 ENCOUNTER — Ambulatory Visit (INDEPENDENT_AMBULATORY_CARE_PROVIDER_SITE_OTHER): Payer: 59 | Admitting: Podiatry

## 2023-09-06 DIAGNOSIS — B351 Tinea unguium: Secondary | ICD-10-CM | POA: Diagnosis not present

## 2023-09-06 DIAGNOSIS — L600 Ingrowing nail: Secondary | ICD-10-CM

## 2023-09-06 NOTE — Progress Notes (Signed)
Subjective:   Patient ID: Robin Andrews, female   DOB: 89 y.o.   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

## 2023-09-06 NOTE — Telephone Encounter (Signed)
Patient's friend called stating there was supposed to have been a prescription sent in after the visit today. He would like prescription sent to [address] [facility].

## 2023-09-10 NOTE — Telephone Encounter (Signed)
No prescription. Can pick up formula 7 at front desk

## 2023-12-06 ENCOUNTER — Ambulatory Visit (INDEPENDENT_AMBULATORY_CARE_PROVIDER_SITE_OTHER): Payer: 59 | Admitting: Podiatry

## 2023-12-06 ENCOUNTER — Encounter: Payer: Self-pay | Admitting: Podiatry

## 2023-12-06 VITALS — Ht 60.0 in | Wt 103.0 lb

## 2023-12-06 DIAGNOSIS — L600 Ingrowing nail: Secondary | ICD-10-CM

## 2023-12-06 NOTE — Progress Notes (Signed)
Subjective:   Patient ID: Robin Andrews, female   DOB: 89 y.o.   MRN: [id number]   HPI Patient presents with a darkened discolored painful second nail right with caregiver that she would like removed   ROS      Objective:  Physical Exam  Neurovascular status intact with discolored moderately discomforting second nailbed right foot     Assessment:  Injury to the nailbed with pain second digit right foot     Plan:  H&P reviewed I have recommended nail removal explained procedure risk patient wants the procedure and I went ahead today and I anesthetized the right second toe sterile prep applied using sterile instrumentation removed the second nail and flushed out the bed did not note any drainage applied sterile dressing reappoint as needed and begin home soaks

## 2025-01-04 ENCOUNTER — Inpatient Hospital Stay (HOSPITAL_COMMUNITY)
Admission: EM | Admit: 2025-01-04 | Source: Home / Self Care | Attending: Internal Medicine | Admitting: Internal Medicine

## 2025-01-04 ENCOUNTER — Other Ambulatory Visit: Payer: Self-pay

## 2025-01-04 ENCOUNTER — Encounter (HOSPITAL_COMMUNITY): Payer: Self-pay

## 2025-01-04 ENCOUNTER — Emergency Department (HOSPITAL_COMMUNITY)

## 2025-01-04 DIAGNOSIS — Z79899 Other long term (current) drug therapy: Secondary | ICD-10-CM

## 2025-01-04 DIAGNOSIS — I10 Essential (primary) hypertension: Secondary | ICD-10-CM | POA: Diagnosis present

## 2025-01-04 DIAGNOSIS — E871 Hypo-osmolality and hyponatremia: Secondary | ICD-10-CM | POA: Diagnosis present

## 2025-01-04 DIAGNOSIS — Z9183 Wandering in diseases classified elsewhere: Secondary | ICD-10-CM

## 2025-01-04 DIAGNOSIS — E785 Hyperlipidemia, unspecified: Secondary | ICD-10-CM | POA: Diagnosis present

## 2025-01-04 DIAGNOSIS — Z888 Allergy status to other drugs, medicaments and biological substances status: Secondary | ICD-10-CM

## 2025-01-04 DIAGNOSIS — R4182 Altered mental status, unspecified: Secondary | ICD-10-CM | POA: Diagnosis not present

## 2025-01-04 DIAGNOSIS — Z603 Acculturation difficulty: Secondary | ICD-10-CM | POA: Diagnosis present

## 2025-01-04 DIAGNOSIS — Z66 Do not resuscitate: Secondary | ICD-10-CM | POA: Diagnosis present

## 2025-01-04 DIAGNOSIS — Z981 Arthrodesis status: Secondary | ICD-10-CM

## 2025-01-04 DIAGNOSIS — E7849 Other hyperlipidemia: Secondary | ICD-10-CM | POA: Diagnosis present

## 2025-01-04 DIAGNOSIS — E1169 Type 2 diabetes mellitus with other specified complication: Secondary | ICD-10-CM | POA: Diagnosis present

## 2025-01-04 DIAGNOSIS — R410 Disorientation, unspecified: Principal | ICD-10-CM

## 2025-01-04 DIAGNOSIS — F0392 Unspecified dementia, unspecified severity, with psychotic disturbance: Principal | ICD-10-CM | POA: Diagnosis present

## 2025-01-04 DIAGNOSIS — Z9181 History of falling: Secondary | ICD-10-CM

## 2025-01-04 DIAGNOSIS — Z602 Problems related to living alone: Secondary | ICD-10-CM | POA: Diagnosis present

## 2025-01-04 DIAGNOSIS — N390 Urinary tract infection, site not specified: Secondary | ICD-10-CM | POA: Diagnosis present

## 2025-01-04 DIAGNOSIS — F03918 Unspecified dementia, unspecified severity, with other behavioral disturbance: Secondary | ICD-10-CM | POA: Diagnosis present

## 2025-01-04 DIAGNOSIS — F05 Delirium due to known physiological condition: Secondary | ICD-10-CM | POA: Diagnosis present

## 2025-01-04 LAB — URINALYSIS, W/ REFLEX TO CULTURE (INFECTION SUSPECTED)
Bilirubin Urine: NEGATIVE
Glucose, UA: NEGATIVE mg/dL
Hgb urine dipstick: NEGATIVE
Ketones, ur: NEGATIVE mg/dL
Nitrite: NEGATIVE
Protein, ur: 30 mg/dL — AB
Specific Gravity, Urine: 1.013 (ref 1.005–1.030)
pH: 5 (ref 5.0–8.0)

## 2025-01-04 LAB — CBC WITH DIFFERENTIAL/PLATELET
Abs Immature Granulocytes: 0.04 K/uL (ref 0.00–0.07)
Basophils Absolute: 0.1 K/uL (ref 0.0–0.1)
Basophils Relative: 1 %
Eosinophils Absolute: 0.3 K/uL (ref 0.0–0.5)
Eosinophils Relative: 3 %
HCT: 35.6 % — ABNORMAL LOW (ref 36.0–46.0)
Hemoglobin: 11.6 g/dL — ABNORMAL LOW (ref 12.0–15.0)
Immature Granulocytes: 0 %
Lymphocytes Relative: 11 %
Lymphs Abs: 1.2 K/uL (ref 0.7–4.0)
MCH: 29.7 pg (ref 26.0–34.0)
MCHC: 32.6 g/dL (ref 30.0–36.0)
MCV: 91 fL (ref 80.0–100.0)
Monocytes Absolute: 0.6 K/uL (ref 0.1–1.0)
Monocytes Relative: 5 %
Neutro Abs: 8.7 K/uL — ABNORMAL HIGH (ref 1.7–7.7)
Neutrophils Relative %: 80 %
Platelets: 379 K/uL (ref 150–400)
RBC: 3.91 MIL/uL (ref 3.87–5.11)
RDW: 11.9 % (ref 11.5–15.5)
WBC: 10.9 K/uL — ABNORMAL HIGH (ref 4.0–10.5)
nRBC: 0 % (ref 0.0–0.2)

## 2025-01-04 LAB — VITAMIN B12: Vitamin B-12: 181 pg/mL (ref 180–914)

## 2025-01-04 LAB — COMPREHENSIVE METABOLIC PANEL WITH GFR
ALT: 11 U/L (ref 0–44)
AST: 36 U/L (ref 15–41)
Albumin: 4.6 g/dL (ref 3.5–5.0)
Alkaline Phosphatase: 96 U/L (ref 38–126)
Anion gap: 15 (ref 5–15)
BUN: 16 mg/dL (ref 8–23)
CO2: 25 mmol/L (ref 22–32)
Calcium: 9.6 mg/dL (ref 8.9–10.3)
Chloride: 94 mmol/L — ABNORMAL LOW (ref 98–111)
Creatinine, Ser: 1.38 mg/dL — ABNORMAL HIGH (ref 0.44–1.00)
GFR, Estimated: 36 mL/min — ABNORMAL LOW
Glucose, Bld: 164 mg/dL — ABNORMAL HIGH (ref 70–99)
Potassium: 3.5 mmol/L (ref 3.5–5.1)
Sodium: 134 mmol/L — ABNORMAL LOW (ref 135–145)
Total Bilirubin: 0.5 mg/dL (ref 0.0–1.2)
Total Protein: 8.2 g/dL — ABNORMAL HIGH (ref 6.5–8.1)

## 2025-01-04 LAB — TSH: TSH: 0.378 u[IU]/mL (ref 0.350–4.500)

## 2025-01-04 LAB — HEMOGLOBIN A1C
Hgb A1c MFr Bld: 5.2 % (ref 4.8–5.6)
Mean Plasma Glucose: 102.54 mg/dL

## 2025-01-04 LAB — FOLATE: Folate: 12.6 ng/mL

## 2025-01-04 LAB — AMMONIA: Ammonia: <13 umol/L (ref 9–35)

## 2025-01-04 MED ORDER — POLYETHYLENE GLYCOL 3350 17 G PO PACK: 17.0000 g | PACK | Freq: Every day | ORAL | Status: DC | PRN

## 2025-01-04 MED ORDER — HYDRALAZINE HCL 20 MG/ML IJ SOLN: 5.0000 mg | Freq: Four times a day (QID) | INTRAMUSCULAR | Status: DC | PRN

## 2025-01-04 MED ORDER — SODIUM CHLORIDE 0.9 % IV SOLN: INTRAVENOUS | Status: AC

## 2025-01-04 MED ORDER — HALOPERIDOL LACTATE 5 MG/ML IJ SOLN
2.0000 mg | Freq: Once | INTRAMUSCULAR | Status: AC
  Administered 2025-01-04: 2 mg via INTRAVENOUS
  Filled 2025-01-04: qty 1

## 2025-01-04 MED ORDER — SODIUM CHLORIDE 0.9 % IV SOLN: 250.0000 mL | INTRAVENOUS | Status: AC | PRN

## 2025-01-04 MED ORDER — LORAZEPAM 2 MG/ML IJ SOLN
1.0000 mg | Freq: Once | INTRAMUSCULAR | Status: AC
  Administered 2025-01-04: 1 mg via INTRAVENOUS
  Filled 2025-01-04: qty 1

## 2025-01-04 MED ORDER — ENOXAPARIN SODIUM 30 MG/0.3ML IJ SOSY
30.0000 mg | PREFILLED_SYRINGE | INTRAMUSCULAR | Status: DC
  Administered 2025-01-04 – 2025-01-13 (×6): 30 mg via SUBCUTANEOUS
  Filled 2025-01-04 (×10): qty 0.3

## 2025-01-04 MED ORDER — THIAMINE MONONITRATE 100 MG PO TABS
100.0000 mg | ORAL_TABLET | Freq: Every day | ORAL | Status: DC
  Administered 2025-01-04 – 2025-01-16 (×10): 100 mg via ORAL
  Filled 2025-01-04 (×13): qty 1

## 2025-01-04 MED ORDER — ADULT MULTIVITAMIN W/MINERALS CH
1.0000 | ORAL_TABLET | Freq: Every day | ORAL | Status: DC
  Administered 2025-01-04 – 2025-01-16 (×11): 1 via ORAL
  Filled 2025-01-04 (×13): qty 1

## 2025-01-04 MED ORDER — SODIUM CHLORIDE 0.9% FLUSH: 3.0000 mL | INTRAVENOUS | Status: DC | PRN

## 2025-01-04 MED ORDER — ACETAMINOPHEN 650 MG RE SUPP: 650.0000 mg | Freq: Four times a day (QID) | RECTAL | Status: DC | PRN

## 2025-01-04 MED ORDER — QUETIAPINE FUMARATE 25 MG PO TABS
25.0000 mg | ORAL_TABLET | Freq: Every day | ORAL | Status: DC
  Administered 2025-01-04 – 2025-01-08 (×4): 25 mg via ORAL
  Filled 2025-01-04 (×6): qty 1

## 2025-01-04 MED ORDER — ONDANSETRON HCL 4 MG/2ML IJ SOLN: 4.0000 mg | Freq: Four times a day (QID) | INTRAMUSCULAR | Status: DC | PRN

## 2025-01-04 MED ORDER — AMLODIPINE BESYLATE 5 MG PO TABS
5.0000 mg | ORAL_TABLET | Freq: Every day | ORAL | Status: DC
  Administered 2025-01-04 – 2025-01-16 (×13): 5 mg via ORAL
  Filled 2025-01-04 (×14): qty 1

## 2025-01-04 MED ORDER — ACETAMINOPHEN 325 MG PO TABS
650.0000 mg | ORAL_TABLET | Freq: Four times a day (QID) | ORAL | Status: DC | PRN
  Administered 2025-01-05 – 2025-01-16 (×21): 650 mg via ORAL
  Filled 2025-01-04 (×27): qty 2

## 2025-01-04 MED ORDER — ONDANSETRON HCL 4 MG PO TABS
4.0000 mg | ORAL_TABLET | Freq: Four times a day (QID) | ORAL | Status: DC | PRN
  Administered 2025-01-07: 4 mg via ORAL
  Filled 2025-01-04: qty 1

## 2025-01-04 MED ORDER — SODIUM CHLORIDE 0.9% FLUSH
3.0000 mL | Freq: Two times a day (BID) | INTRAVENOUS | Status: DC
  Administered 2025-01-05: 3 mL via INTRAVENOUS

## 2025-01-04 NOTE — Hospital Course (Addendum)
 Robin Andrews

## 2025-01-04 NOTE — ED Triage Notes (Addendum)
 Pt came in EMS, pt appears to be confused.  Picked up wandering on the road near a [employer] bystanders called EMS bc pt was walking in traffic.  Pt c/o weakness. No pain.       EMS Vitals BP 130/60 HR 74 100% RA 97.5 CBG 191

## 2025-01-04 NOTE — ED Provider Notes (Signed)
 " [place] EMERGENCY DEPARTMENT AT [facility] Provider Note   CSN: [id number] Arrival date & time: 01/04/25  1245     Patient presents with: Weakness   Robin Andrews is a 89 y.o. female.   Patient is a 89 year old female with a history of diabetes, hypertension, hyperlipidemia, subdural hematoma who presents with confusion.  EMS found her wandering down the road near a [employer].  Apparently she was trying to walk into traffic.  She does have a language barrier.  History was obtained using a video language line.  She also has a hard time hearing and therefore the interpreter was typing sentences on the screen.  She says that she is not in any pain and that she is fine and not crazy.  She was hungry and was walking to try to find something to eat.  She just keeps yelling that she is hungry.  The RN was able to speak with the patient's grandson who states that the patient lives by herself and has been confused over the last 3 weeks.  I am unable to get in touch with the grandson.       Prior to Admission medications  Medication Sig Start Date End Date Taking? Authorizing Provider  amLODipine  (NORVASC ) 5 MG tablet Take 5 mg by mouth daily. 05/06/23   [provider]  Multiple Vitamin (MULTIVITAMIN WITH MINERALS) TABS tablet Take 1 tablet by mouth daily. 08/13/21   [name] LABOR, MD    Allergies: Hydrochlorothiazide  and Lisinopril    Review of Systems  Unable to perform ROS: Mental status change    Updated Vital Signs BP 130/83 (BP Location: Left Arm)   Pulse 73   Temp 98 F (36.7 C) (Oral)   Resp 18   Ht 5' (1.524 m)   Wt 46.7 kg   SpO2 100%   BMI 20.11 kg/m   Physical Exam Constitutional:      Appearance: She is well-developed.  HENT:     Head: Normocephalic and atraumatic.  Eyes:     Pupils: Pupils are equal, round, and reactive to light.  Cardiovascular:     Rate and Rhythm: Normal rate and regular rhythm.     Heart sounds: Normal heart  sounds.  Pulmonary:     Effort: Pulmonary effort is normal. No respiratory distress.     Breath sounds: Normal breath sounds. No wheezing or rales.  Chest:     Chest wall: No tenderness.  Abdominal:     General: Bowel sounds are normal.     Palpations: Abdomen is soft.     Tenderness: There is no abdominal tenderness. There is no guarding or rebound.  Musculoskeletal:        General: Normal range of motion.     Cervical back: Normal range of motion and neck supple.  Lymphadenopathy:     Cervical: No cervical adenopathy.  Skin:    General: Skin is warm and dry.     Findings: No rash.  Neurological:     Mental Status: She is alert.     Comments: Oriented to person only.  Confused to place, year and month.  She is moving all extremities symmetrically without focal deficits.     (all labs ordered are listed, but only abnormal results are displayed) Labs Reviewed  COMPREHENSIVE METABOLIC PANEL WITH GFR - Abnormal; Notable for the following components:      Result Value   Sodium 134 (*)    Chloride 94 (*)  Glucose, Bld 164 (*)    Creatinine, Ser 1.38 (*)    Total Protein 8.2 (*)    GFR, Estimated 36 (*)    All other components within normal limits  CBC WITH DIFFERENTIAL/PLATELET - Abnormal; Notable for the following components:   WBC 10.9 (*)    Hemoglobin 11.6 (*)    HCT 35.6 (*)    Neutro Abs 8.7 (*)    All other components within normal limits  URINALYSIS, W/ REFLEX TO CULTURE (INFECTION SUSPECTED) - Abnormal; Notable for the following components:   APPearance HAZY (*)    Protein, ur 30 (*)    Leukocytes,Ua SMALL (*)    Bacteria, UA RARE (*)    All other components within normal limits  TSH  AMMONIA    EKG: EKG Interpretation Date/Time:  Thursday January 04 2025 15:13:53 EST Ventricular Rate:  70 PR Interval:  168 QRS Duration:  91 QT Interval:  455 QTC Calculation: 491 R Axis:   42  Text Interpretation: Sinus rhythm Nonspecific T abnormalities, diffuse  leads Borderline prolonged QT interval NO SIGNIFICANT CHANGE SINCE LAST TRACING YESTERDAY Reconfirmed by Lenor Hollering [phone]) on 01/04/2025 3:16:49 PM  Radiology: CT Head Wo Contrast Result Date: 01/04/2025 EXAM: CT HEAD WITHOUT CONTRAST 01/04/2025 03:05:02 PM TECHNIQUE: CT of the head was performed without the administration of intravenous contrast. Automated exposure control, iterative reconstruction, and/or weight based adjustment of the mA/kV was utilized to reduce the radiation dose to as low as reasonably achievable. Motion limited study. COMPARISON: 09/05/2021 CLINICAL HISTORY: Mental status change, unknown cause. FINDINGS: BRAIN AND VENTRICLES: No acute hemorrhage. No evidence of acute infarct. No hydrocephalus. No extra-axial collection. No mass effect or midline shift. Mild chronic microvascular ischemic changes. ORBITS: Limited evaluation of the orbits due to motion artifact. SINUSES: No acute abnormality. SOFT TISSUES AND SKULL: No acute soft tissue abnormality. No skull fracture. IMPRESSION: 1. No acute intracranial abnormality. 2. Motion limited exam. Electronically signed by: Donnice Mania MD 01/04/2025 03:12 PM EST RP Workstation: [id number]     Procedures   Medications Ordered in the ED  LORazepam  (ATIVAN ) injection 1 mg (1 mg Intravenous Given 01/04/25 1433)                                    Medical Decision Making Amount and/or Complexity of Data Reviewed Labs: ordered. Radiology: ordered.  Risk Prescription drug management. Decision regarding hospitalization.   This patient presents to the ED for concern of confusion, this involves an extensive number of treatment options, and is a complaint that carries with it a high risk of complications and morbidity.  I considered the following differential and admission for this acute, potentially life threatening condition.  The differential diagnosis includes dementia, meningitis, urinary tract infection, other infection,  intracranial hemorrhage, stroke, electrolyte abnormality  MDM:    Patient is a 89 year old who presents with increased confusion.  She does not have any focal neurologic deficits.  She does not have any complaints of pain.  CBC shows mild elevation in her WBC count.  She is not febrile.  Her urine does not look concerning for infection.  Head CT does not show any acute abnormality.  I attempted to contact her grandson Phi, but he did not answer the phone number that was in the chart.  Called another number that was associated with her family.  They both limited English and asked to call back later this afternoon.  Her grandson did call a RN here at 1 point and advised the RN that this confusion started in the last 3 weeks and that she lives by herself.  They advised that someone would be at.  5:00 this afternoon.  Will plan admission for worsening confusion.  Discussed with the hospitalist, Dr. Sonjia.  (Labs, imaging, consults)  Labs: I Ordered, and personally interpreted labs.  The pertinent results include: Mild elevation of WBC count, urine not concerning for infection.   Imaging Studies ordered: I ordered imaging studies including head CT I independently visualized and interpreted imaging. I agree with the radiologist interpretation  Additional history obtained from  .  External records from outside source obtained and reviewed including    Cardiac Monitoring: The patient was not maintained on a cardiac monitor.  If on the cardiac monitor, I personally viewed and interpreted the cardiac monitored which showed an underlying rhythm of:    Reevaluation: After the interventions noted above, I reevaluated the patient and found that they have :stayed the same  Social Determinants of Health:  lives alone  Disposition: Admit to hospital  Co morbidities that complicate the patient evaluation  Past Medical History:  Diagnosis Date   Degenerative disc disease    Diabetes mellitus  without complication (HCC)    Hypertension    Pruritus      Medicines Meds ordered this encounter  Medications   LORazepam  (ATIVAN ) injection 1 mg    I have reviewed the patients home medicines and have made adjustments as needed  Problem List / ED Course: Problem List Items Addressed This Visit   None Visit Diagnoses       Confusion    -  Primary                Final diagnoses:  Confusion    ED Discharge Orders     None          Lenor Hollering, MD 01/04/25 1637  "

## 2025-01-04 NOTE — ED Notes (Signed)
 Pt IV pump was beeping. I walked in the room to find pt standing up & saying she was going home. Pt pulled out her IV. I helped pt back to bed, explained that she had to stay in bed.

## 2025-01-04 NOTE — H&P (Signed)
 " [facility] History and Physical  Robin Andrews FMW:[id number] DOB: 05-11-1931 DOA: 01/04/2025  Referring physician: ED  PCP: Luke Agent, MD (Inactive)   Patient is coming from: Home  Chief Complaint: Altered mental status  HPI:    Patient is a 89 year old female with a history of diabetes, hypertension, hyperlipidemia, subdural hematoma presented to the hospital with confusion and wandering on the road and bystanders called EMS since she was trying to cross into the traffic.  Patient denied any pain and stated that she was hungry and was just trying to walk to find something to eat.  She also complained of generalized weakness.  Spoke with her grandson at bedside who stated that grandmom wishes to live by herself and for the last 2 weeks or so she has been more confused.  There is also mention of some auditory hallucinations and today she thought that somebody was trying to kill her so that is why she was in the street and still was talking about it at the time of interview.  She is currently living apartment/elderly living.  Of note in the past she was admitted for few days in the hospital and was considered for assisted living facility due to safety reasons.  She had seen primary care provider in the past for issues with cognition as well. In the ED patient was noted to be alert awake but constantly fidgety and trying to get out of bed.  Patient's daughter and grandson at bedside.  In the ED, vitals were stable patient was afebrile.  Labs were notable for WBC at 10.9.  Urinalysis was negative for infection.  BMP was notable for mild hyponatremia with sodium of 134 with creatinine elevation at 1.3.  TSH was within normal range at 0.3.  Ammonia level was less than 13.  CT head scan did not show any acute findings.  In the ED patient received Ativan  1 mg x 1.  Patient was then admitted hospital for further evaluation and treatment.  Assessment and Plan Principal Problem:   Altered mental  status Active Problems:   Type 2 diabetes mellitus with hyperlipidemia (HCC)   Hyperlipidemia   HYPERTENSION, BENIGN ESSENTIAL  Altered mental status, confusion, wandering in the street,  auditory hallucinations.  Could be episode of worsening dementia with behavioral disturbances.  Episodes of agitation and trying to get out of the bed.  Looks like patient has been having delirium with underlying dementia with hallucinations.  History of subdural hematoma in the past but CT head scan without any acute findings..  No history documented of dementia in the chart but PCP with concern in the past that she might have it..    Preliminary workup in the ED negative so far.  Patient without any focal neurological deficits.  Will closely monitor overnight.  Will get psychiatry consultation given the possibility of psychosis and hallucinations.  Will put safety observation, start on low-dose Seroquel  for the night due to episodes of agitation.  Continue gentle hydration.  Will check  A1c, vitamin B1, B12, folic acid levels.  Empirically treat with thiamine  for now.  Will add IV Haldol  x 1 for agitation.  Diabetes mellitus type 2.   Will check hemoglobin A1c.  Med rec pending.  Consider sliding scale insulin  if needed.  Last blood glucose level was 164.  Hypertension.  Med rec shows amlodipine  in the past.  Will resume.  DVT Prophylaxis: Lovenox  subcu.  Review of Systems:  All systems were reviewed and were negative unless  otherwise mentioned in the HPI   Past Medical History:  Diagnosis Date   Degenerative disc disease    Diabetes mellitus without complication (HCC)    Hypertension    Pruritus    Past Surgical History:  Procedure Laterality Date   ANTERIOR CERVICAL DECOMP/DISCECTOMY FUSION  02/2010   C3-6/notes 03/09/2010   BACK SURGERY     DILATION AND CURETTAGE, DIAGNOSTIC / THERAPEUTIC      Social History:  reports that she has never smoked. She has never used smokeless tobacco. She  reports that she does not drink alcohol and does not use drugs.  Allergies[1]  Family History  Family history unknown: Yes     Prior to Admission medications  Medication Sig Start Date End Date Taking? Authorizing Provider  amLODipine  (NORVASC ) 5 MG tablet Take 5 mg by mouth daily. 05/06/23   [provider]  Multiple Vitamin (MULTIVITAMIN WITH MINERALS) TABS tablet Take 1 tablet by mouth daily. 08/13/21   [name] LABOR, MD    Physical Exam:  Vitals:   01/04/25 1301 01/04/25 1303 01/04/25 1640  BP:  130/83 136/88  Pulse:  73 80  Resp:  18 19  Temp:  98 F (36.7 C) 98.6 F (37 C)  TempSrc:  Oral Oral  SpO2:  100% 99%  Weight: 46.7 kg    Height: 5' (1.524 m)     Wt Readings from Last 3 Encounters:  01/04/25 46.7 kg  12/06/23 46.7 kg  06/20/23 46.9 kg   Body mass index is 20.11 kg/m.  General:  Average built, not in obvious distress, elderly female, hard of hearing, appears to be mildly agitated and fidgety. HENT: Normocephalic, No scleral pallor or icterus noted. Oral mucosa is moist.  Chest:  Clear breath sounds.  No crackles or wheezes.  CVS: S1 &S2 heard. No murmur.  Regular rate and rhythm. Abdomen: Soft, nontender, nondistended.  Bowel sounds are heard. No abdominal mass palpated Extremities: No cyanosis, clubbing or edema.  Peripheral pulses are palpable. Psych: Alert, awake and oriented to self only, ongoing hallucinations, agitated at times, confused CNS:  No cranial nerve deficits.  Moving all extremities, Skin: Warm and dry.  No rashes noted.  Labs on Admission:   CBC: Recent Labs  Lab 01/04/25 1430  WBC 10.9*  NEUTROABS 8.7*  HGB 11.6*  HCT 35.6*  MCV 91.0  PLT 379    Basic Metabolic Panel: Recent Labs  Lab 01/04/25 1430  NA 134*  K 3.5  CL 94*  CO2 25  GLUCOSE 164*  BUN 16  CREATININE 1.38*  CALCIUM 9.6    Liver Function Tests: Recent Labs  Lab 01/04/25 1430  AST 36  ALT 11  ALKPHOS 96  BILITOT 0.5  PROT 8.2*   ALBUMIN 4.6   No results for input(s): LIPASE, AMYLASE in the last 168 hours. Recent Labs  Lab 01/04/25 1631  AMMONIA <13    Cardiac Enzymes: No results for input(s): CKTOTAL, CKMB, CKMBINDEX, TROPONINI in the last 168 hours.  BNP (last 3 results) No results for input(s): BNP in the last 8760 hours.  ProBNP (last 3 results) No results for input(s): PROBNP in the last 8760 hours.  CBG: No results for input(s): GLUCAP in the last 168 hours.  Lipase     Component Value Date/Time   LIPASE 108 (H) 10/04/2018 1241     Urinalysis    Component Value Date/Time   COLORURINE YELLOW 01/04/2025 1349   APPEARANCEUR HAZY (A) 01/04/2025 1349   LABSPEC 1.013 01/04/2025  1349   PHURINE 5.0 01/04/2025 1349   GLUCOSEU NEGATIVE 01/04/2025 1349   HGBUR NEGATIVE 01/04/2025 1349   BILIRUBINUR NEGATIVE 01/04/2025 1349   BILIRUBINUR negative 11/08/2018 1136   BILIRUBINUR neg 10/16/2013 1122   KETONESUR NEGATIVE 01/04/2025 1349   PROTEINUR 30 (A) 01/04/2025 1349   UROBILINOGEN 0.2 11/08/2018 1136   UROBILINOGEN 0.2 06/01/2013 1135   NITRITE NEGATIVE 01/04/2025 1349   LEUKOCYTESUR SMALL (A) 01/04/2025 1349    Radiological Exams on Admission: CT Head Wo Contrast Result Date: 01/04/2025 EXAM: CT HEAD WITHOUT CONTRAST 01/04/2025 03:05:02 PM TECHNIQUE: CT of the head was performed without the administration of intravenous contrast. Automated exposure control, iterative reconstruction, and/or weight based adjustment of the mA/kV was utilized to reduce the radiation dose to as low as reasonably achievable. Motion limited study. COMPARISON: 09/05/2021 CLINICAL HISTORY: Mental status change, unknown cause. FINDINGS: BRAIN AND VENTRICLES: No acute hemorrhage. No evidence of acute infarct. No hydrocephalus. No extra-axial collection. No mass effect or midline shift. Mild chronic microvascular ischemic changes. ORBITS: Limited evaluation of the orbits due to motion artifact. SINUSES: No  acute abnormality. SOFT TISSUES AND SKULL: No acute soft tissue abnormality. No skull fracture. IMPRESSION: 1. No acute intracranial abnormality. 2. Motion limited exam. Electronically signed by: Donnice Mania MD 01/04/2025 03:12 PM EST RP Workstation: [id number]    EKG: Personally reviewed by me which shows normal sinus rhythm   Consultant: None  Code Status: DNR.  Discussed with the patient's daughter and grandson at bedside.  Microbiology none  Antibiotics: None  Family Communication:  Patients' condition and plan of care including tests being ordered have been discussed with the patient and the patient's daughter and grandson who indicate understanding and agree with the plan.   Status is: Observation  Severity of Illness: The appropriate patient status for this patient is OBSERVATION. Observation status is judged to be reasonable and necessary in order to provide the required intensity of service to ensure the patient's safety. The patient's presenting symptoms, physical exam findings, and initial radiographic and laboratory data in the context of their medical condition is felt to place them at decreased risk for further clinical deterioration. Furthermore, it is anticipated that the patient will be medically stable for discharge from the hospital within 2 midnights of admission.   Signed, Vernal Alstrom, MD [facility] 01/04/2025         [1]  Allergies Allergen Reactions   Hydrochlorothiazide  Hives and Itching    Severe hyponatremia resulting in hospitalization   Lisinopril Other (See Comments)    Unknown per grandson    "

## 2025-01-04 NOTE — ED Notes (Signed)
 This RN assisted pt to restroom via wheelchair w/o incident where pt voided. Returned pt to bed and reactivated bed alarm.

## 2025-01-04 NOTE — ED Notes (Signed)
 Staff passing by noted pt out of bed, IV removed by herself, bleeding.  Pt assisted to bed, bed alarm placed.  Bleeding controlled to IV site with dressing applied. Room cleaned and pt's hands washed.

## 2025-01-05 DIAGNOSIS — R443 Hallucinations, unspecified: Secondary | ICD-10-CM

## 2025-01-05 DIAGNOSIS — Z9183 Wandering in diseases classified elsewhere: Secondary | ICD-10-CM | POA: Diagnosis not present

## 2025-01-05 DIAGNOSIS — R410 Disorientation, unspecified: Secondary | ICD-10-CM | POA: Diagnosis not present

## 2025-01-05 DIAGNOSIS — R4182 Altered mental status, unspecified: Secondary | ICD-10-CM | POA: Diagnosis not present

## 2025-01-05 LAB — BASIC METABOLIC PANEL WITH GFR
Anion gap: 13 (ref 5–15)
BUN: 16 mg/dL (ref 8–23)
CO2: 26 mmol/L (ref 22–32)
Calcium: 9.7 mg/dL (ref 8.9–10.3)
Chloride: 99 mmol/L (ref 98–111)
Creatinine, Ser: 1.42 mg/dL — ABNORMAL HIGH (ref 0.44–1.00)
GFR, Estimated: 35 mL/min — ABNORMAL LOW
Glucose, Bld: 119 mg/dL — ABNORMAL HIGH (ref 70–99)
Potassium: 3.4 mmol/L — ABNORMAL LOW (ref 3.5–5.1)
Sodium: 138 mmol/L (ref 135–145)

## 2025-01-05 LAB — CBC
HCT: 30.1 % — ABNORMAL LOW (ref 36.0–46.0)
Hemoglobin: 10.1 g/dL — ABNORMAL LOW (ref 12.0–15.0)
MCH: 30.5 pg (ref 26.0–34.0)
MCHC: 33.6 g/dL (ref 30.0–36.0)
MCV: 90.9 fL (ref 80.0–100.0)
Platelets: 314 K/uL (ref 150–400)
RBC: 3.31 MIL/uL — ABNORMAL LOW (ref 3.87–5.11)
RDW: 11.8 % (ref 11.5–15.5)
WBC: 9.8 K/uL (ref 4.0–10.5)
nRBC: 0 % (ref 0.0–0.2)

## 2025-01-05 MED ORDER — SODIUM CHLORIDE 0.9 % IV SOLN
1.0000 g | INTRAVENOUS | Status: DC
  Administered 2025-01-05: 1 g via INTRAVENOUS
  Filled 2025-01-05: qty 10

## 2025-01-05 MED ORDER — POTASSIUM CHLORIDE CRYS ER 20 MEQ PO TBCR
40.0000 meq | EXTENDED_RELEASE_TABLET | Freq: Once | ORAL | Status: DC
  Filled 2025-01-05 (×2): qty 2

## 2025-01-05 NOTE — ED Notes (Signed)
 Pt sleeping no signs of distress

## 2025-01-05 NOTE — Evaluation (Signed)
 Occupational Therapy Evaluation Patient Details Name: Robin Andrews MRN: [id number] DOB: 10-06-1931 Today's Date: 01/05/2025   History of Present Illness   Patient is a 89 year old female presented to the hospital with confusion and wandering on the road and bystanders called EMS since she was trying to cross into the traffic. Taken to ED with dx of Altered mental status, confusion, wandering in the street,  auditory hallucinations PMH: with a history of diabetes, hypertension, hyperlipidemia, subdural hematoma     Clinical Impressions No family at bedside and patient a poor historian. Utilized Vietnamese interpreter for evaluation session. As per chart review, patient lives alone, independent in apt with intermittent family assist.  Currently, patient presents with deficits outlined below (see OT Problem List for details) most significantly decreased cognition, balance and activity tolerance with generalized muscle weakness limiting BADL's (min-mod A with cues) and functional mobility (min A/cues) performance. Patient will benefit from continued inpatient follow up therapy, <3 hours/day. Patient requires continued Acute care hospital level OT services to progress safety and functional performance and allow for discharge.       If plan is discharge home, recommend the following:   A lot of help with walking and/or transfers;A lot of help with bathing/dressing/bathroom;Assistance with cooking/housework;Direct supervision/assist for medications management;Direct supervision/assist for financial management;Assist for transportation;Supervision due to cognitive status;Help with stairs or ramp for entrance     Functional Status Assessment   Patient has had a recent decline in their functional status and demonstrates the ability to make significant improvements in function in a reasonable and predictable amount of time.     Equipment Recommendations   None recommended by OT       Precautions/Restrictions   Precautions Precautions: Fall Recall of Precautions/Restrictions: Impaired Precaution/Restrictions Comments: Financial Controller required, JABIL CIRCUIT Restrictions [employer] Bearing Restrictions Per Provider Order: No     Mobility Bed Mobility Overal bed mobility: Needs Assistance Bed Mobility: Supine to Sit, Sit to Supine     Supine to sit: Contact guard Sit to supine: Contact guard assist   General bed mobility comments: multi modal cues for safety    Transfers Overall transfer level: Needs assistance   Transfers: Sit to/from Stand, Bed to chair/wheelchair/BSC Sit to Stand: Min assist     Step pivot transfers: Min assist     General transfer comment: multi modal cues for all sequencing and safety during transfers and amb without AD      Balance Overall balance assessment: Needs assistance Sitting-balance support: Feet supported, No upper extremity supported Sitting balance-Leahy Scale: Fair     Standing balance support:  (steadying with gait belt) Standing balance-Leahy Scale: Poor Standing balance comment: use of gait belt to maintain balance                           ADL either performed or assessed with clinical judgement   ADL Overall ADL's : Needs assistance/impaired Eating/Feeding: Set up;Sitting   Grooming: Wash/dry hands;Wash/dry face;Cueing for sequencing;Cueing for safety;Standing;Minimal assistance   Upper Body Bathing: Minimal assistance;Sitting;Cueing for sequencing   Lower Body Bathing: Moderate assistance;Sit to/from stand;Cueing for sequencing;Cueing for safety   Upper Body Dressing : Moderate assistance;Sitting Upper Body Dressing Details (indicate cue type and reason): had multiple layers of cloting onand removed to don hospital gown with assist Lower Body Dressing: Moderate assistance;Cueing for safety;Cueing for sequencing;Sit to/from stand   Toilet Transfer: Minimal assistance;Ambulation;Regular  Toilet;Cueing for sequencing;Cueing for safety   Toileting- Clothing Manipulation and Hygiene: Minimal  assistance;Sitting/lateral lean;Cueing for sequencing;Cueing for safety       Functional mobility during ADLs: Minimal assistance;Cueing for sequencing;Cueing for safety General ADL Comments: cues throughout session for safety, highly impulsive     Vision Ability to See in Adequate Light: 0 Adequate Vision Assessment?: No apparent visual deficits            Pertinent Vitals/Pain Pain Assessment Pain Assessment: Faces Faces Pain Scale: No hurt     Extremity/Trunk Assessment Upper Extremity Assessment Upper Extremity Assessment: Generalized weakness;Right hand dominant   Lower Extremity Assessment Lower Extremity Assessment: Defer to PT evaluation   Cervical / Trunk Assessment Cervical / Trunk Assessment: Normal   Communication Communication Communication: Impaired Factors Affecting Communication: Hearing impaired;Non - English speaking, interpreter not available   Cognition Arousal: Alert Behavior During Therapy: Restless, Impulsive Cognition: History of cognitive impairments             OT - Cognition Comments: alert, Ox 1 self only, follows 1 step commands with demo and cues, poor STM, impulsivity, decreased safety, jusdgement and insight                 Following commands: Impaired Following commands impaired: Follows one step commands inconsistently     Cueing  General Comments   Cueing Techniques: Verbal cues;Gestural cues;Tactile cues;Visual cues  low BMI, tolerated light activity and amb on unit, pulled out IV in ED therefore some residual blood at the site, nursing aware   Exercises     Shoulder Instructions      Home Living Family/patient expects to be discharged to:: Private residence Living Arrangements: Alone                               Additional Comments: Patient is a poor historian and no family bedside       Prior Functioning/Environment Prior Level of Function : Patient poor historian/Family not available             Mobility Comments: chart review reveals patient had used a RW past admission but was found in traffic without AD ADLs Comments: chart reveals indep with prn assist from family    OT Problem List: Decreased strength;Decreased activity tolerance;Decreased cognition;Decreased safety awareness   OT Treatment/Interventions: Self-care/ADL training;Therapeutic exercise;Neuromuscular education;Energy conservation;DME and/or AE instruction;Therapeutic activities;Cognitive remediation/compensation;Patient/family education;Balance training      OT Goals(Current goals can be found in the care plan section)   Acute Rehab OT Goals Patient Stated Goal: unable to state OT Goal Formulation: Patient unable to participate in goal setting Time For Goal Achievement: 01/19/25 Potential to Achieve Goals: Fair ADL Goals Pt Will Perform Grooming: with supervision;standing Pt Will Perform Upper Body Bathing: with supervision;sitting Pt Will Perform Upper Body Dressing: with supervision;sitting Pt Will Perform Lower Body Dressing: with supervision;sit to/from stand Pt Will Transfer to Toilet: with supervision;ambulating   OT Frequency:  Min 2X/week       AM-PAC OT 6 Clicks Daily Activity     Outcome Measure Help from another person eating meals?: A Little Help from another person taking care of personal grooming?: A Little Help from another person toileting, which includes using toliet, bedpan, or urinal?: A Lot Help from another person bathing (including washing, rinsing, drying)?: A Lot Help from another person to put on and taking off regular upper body clothing?: A Lot Help from another person to put on and taking off regular lower body clothing?: A Little 6 Click Score: 15  End of Session Equipment Utilized During Treatment: Gait belt Nurse Communication: Mobility  status  Activity Tolerance: Patient tolerated treatment well Patient left: in chair  OT Visit Diagnosis: Unsteadiness on feet (R26.81);Muscle weakness (generalized) (M62.81);Cognitive communication deficit (R41.841);Adult, failure to thrive (R62.7)                Time: 8854-8784 OT Time Calculation (min): 30 min Charges:  OT General Charges $OT Visit: 1 Visit OT Evaluation $OT Eval Low Complexity: 1 Low OT Treatments $Self Care/Home Management : 8-22 mins  Otoniel Myhand OT/L Acute Rehabilitation Department  [phone]  01/05/2025, 1:23 PM

## 2025-01-05 NOTE — ED Notes (Signed)
 Pt assisted to bed side commode, had to be redirected back into bed. Pt given sandwich and water.

## 2025-01-05 NOTE — ED Notes (Signed)
 Informed the nurse that the pt vitals are do, nurse told me to not wake the pt up at this time

## 2025-01-05 NOTE — Evaluation (Signed)
 Physical Therapy Evaluation Patient Details Name: Robin Andrews MRN: [id number] DOB: 1931/11/07 Today's Date: 01/05/2025  History of Present Illness  Patient is a 89 year old female presented to the hospital with confusion and wandering on the road and bystanders called EMS since she was trying to cross into the traffic. Taken to ED with dx of Altered mental status, confusion, wandering in the street,  auditory hallucinations PMH: with a history of diabetes, hypertension, hyperlipidemia, subdural hematoma  Clinical Impression  No family at bedside and patient a poor historian. Utilized Vietnamese interpreter for evaluation session. As per chart review, patient lives alone, independent in apt with intermittent family assist.  Currently, patient presents with deficits outlined below (see PT Problem List for details) most significantly decreased cognition, balance and activity tolerance with generalized muscle weakness limiting functional mobility. Patient will benefit from continued inpatient follow up therapy, <3 hours/day. Patient requires continued Acute care hospital level PT services to progress safety and functional performance and allow for discharge.         If plan is discharge home, recommend the following: A little help with walking and/or transfers;A little help with bathing/dressing/bathroom;Assistance with cooking/housework;Assist for transportation;Help with stairs or ramp for entrance;Supervision due to cognitive status   Can travel by private vehicle   Yes    Equipment Recommendations None recommended by PT  Recommendations for Other Services       Functional Status Assessment Patient has had a recent decline in their functional status and demonstrates the ability to make significant improvements in function in a reasonable and predictable amount of time.     Precautions / Restrictions Precautions Precautions: Fall Recall of Precautions/Restrictions:  Impaired Precaution/Restrictions Comments: Financial Controller required, JABIL CIRCUIT Restrictions [employer] Bearing Restrictions Per Provider Order: No      Mobility  Bed Mobility Overal bed mobility: Needs Assistance Bed Mobility: Supine to Sit, Sit to Supine     Supine to sit: Contact guard Sit to supine: Contact guard assist   General bed mobility comments: multi modal cues for safety    Transfers Overall transfer level: Needs assistance   Transfers: Sit to/from Stand, Bed to chair/wheelchair/BSC Sit to Stand: Min assist   Step pivot transfers: Min assist       General transfer comment: multi modal cues for all sequencing and safety during transfers and amb without AD    Ambulation/Gait Ambulation/Gait assistance: Min assist Gait Distance (Feet): 185 Feet Assistive device: 1 person hand held assist Gait Pattern/deviations: Step-through pattern, Decreased step length - right, Decreased step length - left, Shuffle, Staggering left, Staggering right, Narrow base of support       General Gait Details: HHA for stability with pt at increased risk of falling 2* balance deficits  Stairs            Wheelchair Mobility     Tilt Bed    Modified Rankin (Stroke Patients Only)       Balance Overall balance assessment: Needs assistance Sitting-balance support: Feet supported, No upper extremity supported Sitting balance-Leahy Scale: Fair       Standing balance-Leahy Scale: Poor Standing balance comment: use of gait belt to maintain balance                             Pertinent Vitals/Pain Pain Assessment Pain Assessment: Faces Faces Pain Scale: No hurt    Home Living Family/patient expects to be discharged to:: Private residence Living Arrangements: Alone Available Help at Discharge:  Family;Available PRN/intermittently Type of Home:  (senior living apt) Home Access: Stairs to enter   [employer] of Steps: 1   Home Layout: One  level Home Equipment: Agricultural Consultant (2 wheels) Additional Comments: Patient is a poor historian and no family bedside - details of home set up taken from chart    Prior Function Prior Level of Function : Patient poor historian/Family not available             Mobility Comments: chart review reveals patient had used a RW past admission but was found in traffic without AD ADLs Comments: chart reveals indep with prn assist from family     Extremity/Trunk Assessment   Upper Extremity Assessment Upper Extremity Assessment: Generalized weakness    Lower Extremity Assessment Lower Extremity Assessment: Generalized weakness    Cervical / Trunk Assessment Cervical / Trunk Assessment: Normal  Communication   Communication Communication: Impaired Factors Affecting Communication: Hearing impaired    Cognition Arousal: Alert Behavior During Therapy: Restless, Impulsive                             Following commands: Impaired Following commands impaired: Follows one step commands inconsistently     Cueing Cueing Techniques: Verbal cues, Gestural cues, Tactile cues, Visual cues     General Comments General comments (skin integrity, edema, etc.): low BMI, tolerated light activity and amb on unit, pulled out IV in ED therefore some residual blood at the site, nursing aware    Exercises     Assessment/Plan    PT Assessment Patient needs continued PT services  PT Problem List Decreased strength;Decreased activity tolerance;Decreased balance;Decreased mobility;Decreased cognition;Decreased knowledge of use of DME       PT Treatment Interventions DME instruction;Gait training;Stair training;Functional mobility training;Therapeutic activities;Therapeutic exercise;Balance training;Patient/family education    PT Goals (Current goals can be found in the Care Plan section)  Acute Rehab PT Goals Patient Stated Goal: No specific goals expressed other than to eat soup and  bread PT Goal Formulation: With patient Time For Goal Achievement: 01/19/25 Potential to Achieve Goals: Fair    Frequency Min 2X/week     Co-evaluation PT/OT/SLP Co-Evaluation/Treatment: Yes Reason for Co-Treatment: To address functional/ADL transfers PT goals addressed during session: Mobility/safety with mobility OT goals addressed during session: ADL's and self-care       AM-PAC PT 6 Clicks Mobility  Outcome Measure Help needed turning from your back to your side while in a flat bed without using bedrails?: A Little Help needed moving from lying on your back to sitting on the side of a flat bed without using bedrails?: A Little Help needed moving to and from a bed to a chair (including a wheelchair)?: A Little Help needed standing up from a chair using your arms (e.g., wheelchair or bedside chair)?: A Little Help needed to walk in hospital room?: A Little Help needed climbing 3-5 steps with a railing? : A Lot 6 Click Score: 17    End of Session Equipment Utilized During Treatment: Gait belt Activity Tolerance: Patient tolerated treatment well Patient left: in chair;with call bell/phone within reach;with chair alarm set Nurse Communication: Mobility status PT Visit Diagnosis: Unsteadiness on feet (R26.81);Muscle weakness (generalized) (M62.81);Difficulty in walking, not elsewhere classified (R26.2)    Time: 1202-1217 PT Time Calculation (min) (ACUTE ONLY): 15 min   Charges:   PT Evaluation $PT Eval Low Complexity: 1 Low   PT General Charges $$ ACUTE PT VISIT: 1 Visit  [facility] PT Acute Rehabilitation Services Office [phone]   Aryaan Persichetti 01/05/2025, 2:12 PM

## 2025-01-05 NOTE — Consult Note (Signed)
" °  Psychiatry Consult Note  The patient is a 89 year old female with a medical history significant for diabetes mellitus, hypertension, hyperlipidemia, prior subdural hematoma, and progressive cognitive decline, who presented to the hospital after being found wandering in the roadway. Per report, bystanders contacted EMS after observing the patient attempting to cross into traffic. Collateral information obtained indicates that the patient lives alone by preference, though her grandson reports worsening confusion over the past two weeks. There are documented concerns for auditory hallucinations and paranoid ideation, including the belief that someone was trying to kill her, which appears to have precipitated her wandering behavior.  Chart review indicates a prior hospitalization during which assisted living placement was considered due to safety concerns and declining cognition. Psychiatry was consulted for evaluation of hallucinations, confusion, and wandering behaviors with concern for dementia with psychotic features. The patient was started on quetiapine  25 mg PO nightly; effectiveness is unclear due to limited documentation, though nursing notes indicate the patient rested overnight without signs of distress.  On evaluation today, the patient appeared pleasantly confused, calm, and cooperative. She was largely non-verbal but able to communicate brief phrases in English, including thank you and Im cold. She responded positively to comfort measures, including a warmed blanket, and was observed singing Happy Birthday when assisted back to bed. She was redirectable and did not demonstrate acute agitation, overt psychosis, or delirium at the time of assessment. While she appears largely independent with basic ADLs, her cognitive impairment remains evident.  Laboratory review revealed asymptomatic bacteriuria with trace leukocytes on urinalysis. While not currently symptomatic, this may be contributing  to fluctuating cognition or superimposed delirium in the setting of an underlying major neurocognitive disorder. At present, there are no clear signs of active delirium; however, given her age and vulnerability, mental status may fluctuate.  An Adult Protective Services (APS) referral was completed due to the patient being found wandering outdoors in freezing temperatures while wearing multiple layers of inappropriate clothing, including female garments. This provider spoke directly with APS intake Artie Cardinal), who completed the intake report. No additional safety concerns were identified beyond those already addressed.  Given the patients frequent attempts to ambulate independently, including multiple trips to the bathroom during evaluation, a one-to-one safety sitter was ordered due to high fall risk and wandering behaviors. At this time, there are no acute psychiatric indications for continued inpatient psychiatric involvement. 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 Recommend outpatient neurology consult for dementia workup and referral for ALF.   All questions and concerns were addressed. Psychiatry will sign off at this time. Re-consult as needed should mental status acutely worsen or new psychiatric concerns arise. "

## 2025-01-05 NOTE — Progress Notes (Addendum)
 " PROGRESS NOTE  Robin Andrews  DOB: 14-Nov-1931  PCP: Luke Agent, MD (Inactive) FMW:[id number]  DOA: 01/04/2025  LOS: 0 days  Hospital Day: 2  Subjective: Patient was seen and examined this morning. Afebrile, hemodynamically stable Lab this morning with potassium 3.4, BUN/creatinine 16/1.42  Brief narrative: TU Robin Andrews is a 89 y.o. female with PMH significant for DM2, HTN, HLD, subdural hematoma  1/15, patient was wandering on the road, seen by bystanders and called EMS.  Patient reportedly said she was hungry and was just trying to walk to find something to eat. Later in the ED, grandson stated that patient prefers to live by herself and has been confused for last 3 weeks.  In the ED, she also mentioned some auditory hallucinations, thought that somebody was trying to kill her so that is why she was in the street.  Vital signs stable Labs with WC count 10.9, sodium 134 Urinalysis showed hazy yellow urine with small leukocytes, rare bacteria Ammonia level low at 13, TSH within normal range. CT head scan did not show any acute findings.   In the ED patient received Ativan  1 mg x 1.   Admitted to [facility]  Assessment and plan: Acute delirium  Wandering elderly Underlying dementia  Brought after she was noticed to be wandering on the road into traffic. On exam, no focal deficits.  Labs and imagings did not show any clear organic or metabolic findings. Vitamin B12, folate, TSH normal This is probably progressive dementia with delirium and psychotic symptoms. On low-dose Seroquel  25 at night for agitation.  Continue to monitor. Continue safety observation  Possible UTI Urinalysis showed hazy yellow urine with small leukocytes, rare bacteria UTI might have precipitated delirium.  Will start on IV Rocephin  empirically for 3 days  Mobility: Pending PT eval  PT Orders: Active   PT Follow up Rec: Skilled Nursing-Short Term Rehab (<3 Hours/Day)01/05/2025 1400    Goals of care   Code  Status: Limited: Do not attempt resuscitation (DNR) -DNR-LIMITED -Do Not Intubate/DNI      DVT prophylaxis:  enoxaparin  (LOVENOX ) injection 30 mg Start: 01/04/25 2200   Antimicrobials: None Fluid: None Consultants: None Family Communication: I spoke with patient's grandson Mr. Nguyen this afternoon.  He is expecting SNF placement  Status: Observation Level of care:  Med-Surg   Patient is from: Lives alone Needs to continue in-hospital care: Ongoing workup, pending PT eval Anticipated d/c to: Family expects SNF placement      Diet:  Diet Order             DIET SOFT Room service appropriate? Yes; Fluid consistency: Thin  Diet effective now                   Scheduled Meds:  amLODipine   5 mg Oral Daily   enoxaparin  (LOVENOX ) injection  30 mg Subcutaneous Q24H   multivitamin with minerals  1 tablet Oral Daily   potassium chloride   40 mEq Oral Once   QUEtiapine   25 mg Oral QHS   sodium chloride  flush  3 mL Intravenous Q12H   thiamine   100 mg Oral Daily    PRN meds: sodium chloride , acetaminophen  **OR** acetaminophen , hydrALAZINE , ondansetron  **OR** ondansetron  (ZOFRAN ) IV, polyethylene glycol, sodium chloride  flush   Infusions:   sodium chloride  Stopped (01/05/25 0359)   sodium chloride      cefTRIAXone  (ROCEPHIN )  IV      Antimicrobials: Anti-infectives (From admission, onward)    Start     Dose/Rate Route  Frequency Ordered Stop   01/05/25 1600  cefTRIAXone  (ROCEPHIN ) 1 g in sodium chloride  0.9 % 100 mL IVPB        1 g 200 mL/hr over 30 Minutes Intravenous Every 24 hours 01/05/25 1515         Objective: Vitals:   01/05/25 0911 01/05/25 1059  BP: (!) 152/70 (!) 122/50  Pulse: 99 80  Resp: 17 16  Temp: (!) 97.4 F (36.3 C) (!) 97.5 F (36.4 C)  SpO2: 98% 99%   No intake or output data in the 24 hours ending 01/05/25 1515 Filed Weights   01/04/25 1301  Weight: 46.7 kg   Weight change:  Body mass index is 20.11 kg/m.   Physical  Exam: General exam: Pleasant, elderly female of [place] origin Skin: No rashes, lesions or ulcers. HEENT: Atraumatic, normocephalic, no obvious bleeding Lungs: Clear to auscultation bilaterally,  CVS: S1, S2, no murmur,   GI/Abd: Soft, nontender, nondistended, bowel sound present,   CNS: Alert, awake, keeps repeating the same words Extremities: No pedal edema, no calf tenderness,   Data Review: I have personally reviewed the laboratory data and studies available.  F/u labs ordered Unresulted Labs (From admission, onward)     Start     Ordered   01/04/25 1744  Vitamin B1  Add-on,   AD        01/04/25 1743            Signed, Chapman Rota, MD [facility] 01/05/2025  "

## 2025-01-06 DIAGNOSIS — I10 Essential (primary) hypertension: Secondary | ICD-10-CM | POA: Diagnosis not present

## 2025-01-06 DIAGNOSIS — R410 Disorientation, unspecified: Secondary | ICD-10-CM | POA: Diagnosis not present

## 2025-01-06 DIAGNOSIS — E785 Hyperlipidemia, unspecified: Secondary | ICD-10-CM | POA: Diagnosis not present

## 2025-01-06 DIAGNOSIS — E1169 Type 2 diabetes mellitus with other specified complication: Secondary | ICD-10-CM | POA: Diagnosis not present

## 2025-01-06 LAB — VITAMIN B1: Vitamin B1 (Thiamine): 80.8 nmol/L (ref 66.5–200.0)

## 2025-01-06 MED ORDER — ORAL CARE MOUTH RINSE: 15.0000 mL | OROMUCOSAL | Status: DC | PRN

## 2025-01-06 MED ORDER — CEPHALEXIN 500 MG PO CAPS
500.0000 mg | ORAL_CAPSULE | Freq: Three times a day (TID) | ORAL | Status: AC
  Administered 2025-01-06 – 2025-01-09 (×8): 500 mg via ORAL
  Filled 2025-01-06 (×9): qty 1

## 2025-01-06 NOTE — TOC Initial Note (Signed)
 Transition of Care [facility]) - Initial/Assessment Note    Patient Details  Name: Robin Andrews MRN: [id number] Date of Birth: Feb 21, 1931  Transition of Care [facility]) CM/SW Contact:    Doneta Glenys ONEIDA, RN Phone Number:   Clinical Narrative:                 MOON completed. CM spoke with Leontine Girtha Rom, Emergency Contact [phone] to discuss observation statues and agreeable to notice. Phi states that patient lives at [facility] a senior living community. DME-walker, Denies HH or SDOH needs. Phi will transport at discharge.  Expected Discharge Plan: Skilled Nursing Facility Barriers to Discharge: Continued Medical Work up   Patient Goals and CMS Choice   CMS Medicare.gov Compare Post Acute Care list provided to:: Patient Represenative (must comment) Choice offered to / list presented to : Adult Children [place] ownership interest in [facility].provided to:: Adult Children    Expected Discharge Plan and Services In-house Referral: NA Discharge Planning Services: CM Consult   Living arrangements for the past 2 months: Apartment                 DME Arranged: N/A DME Agency: NA       HH Arranged: NA HH Agency: NA        Prior Living Arrangements/Services Living arrangements for the past 2 months: Apartment Lives with:: Self Patient language and need for interpreter reviewed:: Yes (Vietnamese)        Need for Family Participation in Patient Care: Yes (Comment) Care giver support system in place?: Yes (comment) Current home services:  (NA) Criminal Activity/Legal Involvement Pertinent to Current Situation/Hospitalization: No - Comment as needed  Activities of Daily Living   ADL Screening (condition at time of admission) Independently performs ADLs?: Yes (appropriate for developmental age)  Permission Sought/Granted                  Emotional Assessment Appearance:: Appears stated age Attitude/Demeanor/Rapport: Engaged Affect (typically  observed): Happy Orientation: : Oriented to Self, Fluctuating Orientation (Suspected and/or reported Sundowners), Oriented to Place Alcohol / Substance Use: Not Applicable Psych Involvement: Yes (comment)  Admission diagnosis:  Confusion [R41.0] Altered mental status [R41.82] Patient Active Problem List   Diagnosis Date Noted   Confusion 01/06/2025   Altered mental status 01/04/2025   AKI (acute kidney injury) 06/16/2023   Iron deficiency anemia 06/16/2023   Acute CHF (congestive heart failure) (HCC) 06/15/2023   Elevated brain natriuretic peptide (BNP) level 06/15/2023   Hematoma of occipital region of scalp 08/11/2021   SDH (subdural hematoma) (HCC) 08/08/2021   Hyponatremia 01/29/2021   Acute metabolic encephalopathy 01/29/2021   Vertigo 04/18/2020   Noninfectious otitis externa of right ear 04/18/2020   Chronic neck pain 11/09/2019   Chronic bilateral low back pain without sciatica 11/09/2019   Hypokalemia    Type 2 diabetes mellitus with hyperlipidemia (HCC) 12/18/2017   Cardiac murmur 12/18/2017   Cervical disc disease 05/25/2012   Chronic pain syndrome 05/25/2012   Osteoarthritis 05/25/2012   Hyperlipidemia 05/30/2007   HYPERTENSION, BENIGN ESSENTIAL 05/30/2007   ASTEATOTIC ECZEMA 05/30/2007   PCP:  Luke Agent, MD (Inactive) Pharmacy:   [id number] [place], [place] - [address] AT [facility] OF [facility] & [address] [place] [address] Phone: [phone] Fax: [phone]  [facility] - [place], [place] - [address] [place] [address] Phone: [phone] Fax: [phone]  Social Drivers of Health (SDOH) Social History: SDOH Screenings   Housing: Low Risk (01/05/2025)  Social Connections: Unknown (01/05/2025)  Tobacco Use: Low Risk (01/04/2025)   SDOH Interventions:     Readmission Risk Interventions     No data to display

## 2025-01-06 NOTE — Progress Notes (Signed)
Patient refused AM vitals

## 2025-01-06 NOTE — Care Management Obs Status (Signed)
 MEDICARE OBSERVATION STATUS NOTIFICATION   Patient Details  Name: Robin Andrews MRN: [id number] Date of Birth: August 18, 1931   Medicare Observation Status Notification Given:  Yes    Jaylene Schrom, Glenys ONEIDA, RN

## 2025-01-06 NOTE — Progress Notes (Signed)
 " PROGRESS NOTE  Robin Andrews  DOB: 05/29/1931  PCP: Luke Agent, MD (Inactive) FMW:[id number]  DOA: 01/04/2025  LOS: 0 days  Hospital Day: 3  Brief narrative: Robin Andrews is a 89 y.o. female with PMH significant for DM2, HTN, HLD, subdural hematoma  1/15, patient was wandering on the road, seen by bystanders and called EMS.  Patient reportedly said she was hungry and was just trying to walk to find something to eat. Later in the ED, grandson stated that patient prefers to live by herself and has been confused for last 3 weeks.  In the ED, she also mentioned some auditory hallucinations, thought that somebody was trying to kill her so that is why she was in the street.  Vital signs stable Labs with WC count 10.9, sodium 134 Urinalysis showed hazy yellow urine with small leukocytes, rare bacteria Ammonia level low at 13, TSH within normal range. CT head scan did not show any acute findings.   In the ED patient received Ativan  1 mg x 1.   Admitted to [facility].  1/17: Hemodynamically stable, TOC is looking for placement as family is unable to take care of her.  Concern of advanced dementia.  Switching ceftriaxone  with Keflex , no urine cultures but patient was keep pulling IVs and does not want anymore.  Subjective: Patient was seen and examined today, she was pulling her IV out, multiple attempts and now refusing to put more IVs.  Ceftriaxone  was switched with Keflex , no urine cultures. APS case was filed and TOC is looking for placement.  Assessment and plan: Acute delirium  Wandering elderly Underlying dementia  Brought after she was noticed to be wandering on the road into traffic. On exam, no focal deficits.  Labs and imagings did not show any clear organic or metabolic findings. Vitamin B12, folate, TSH normal This is probably progressive dementia with delirium and psychotic symptoms. On low-dose Seroquel  25 at night for agitation.  Continue to monitor. Continue safety  observation  Possible UTI Urinalysis showed hazy yellow urine with small leukocytes, rare bacteria UTI might have precipitated delirium.  She was started on Rocephin  but keep losing IV so it switched with Keflex .  No cultures were obtained.  Will do a 3-day course.  Mobility: Pending PT eval-recommending SNF  PT Orders: Active   PT Follow up Rec: Skilled Nursing-Short Term Rehab (<3 Hours/Day)01/05/2025 1400    Goals of care   Code Status: Limited: Do not attempt resuscitation (DNR) -DNR-LIMITED -Do Not Intubate/DNI      DVT prophylaxis:  enoxaparin  (LOVENOX ) injection 30 mg Start: 01/04/25 2200   Antimicrobials: None Fluid: None Consultants: None Family Communication: Tried calling grandson with no response  Status: Observation Level of care:  Med-Surg   Patient is from: Lives alone Needs to continue in-hospital care: Needs SNF placement. Anticipated d/c to: SNF    Diet:  Diet Order             DIET SOFT Room service appropriate? Yes; Fluid consistency: Thin  Diet effective now                   Scheduled Meds:  amLODipine   5 mg Oral Daily   cephALEXin   500 mg Oral Q8H   enoxaparin  (LOVENOX ) injection  30 mg Subcutaneous Q24H   multivitamin with minerals  1 tablet Oral Daily   potassium chloride   40 mEq Oral Once   QUEtiapine   25 mg Oral QHS   sodium chloride  flush  3 mL  Intravenous Q12H   thiamine   100 mg Oral Daily    PRN meds: acetaminophen  **OR** acetaminophen , hydrALAZINE , ondansetron  **OR** ondansetron  (ZOFRAN ) IV, mouth rinse, polyethylene glycol, sodium chloride  flush   Infusions:   Antimicrobials: Anti-infectives (From admission, onward)    Start     Dose/Rate Route Frequency Ordered Stop   01/06/25 1415  cephALEXin  (KEFLEX ) capsule 500 mg        500 mg Oral Every 8 hours 01/06/25 1323 01/09/25 1359   01/05/25 1615  cefTRIAXone  (ROCEPHIN ) 1 g in sodium chloride  0.9 % 100 mL IVPB  Status:  Discontinued        1 g 200 mL/hr over 30  Minutes Intravenous Every 24 hours 01/05/25 1515 01/06/25 1323       Objective: Vitals:   01/05/25 1635 01/05/25 2000  BP: (!) 113/47 (!) 126/58  Pulse: 81 83  Resp: 16 18  Temp:  99.1 F (37.3 C)  SpO2: 100% 100%    Intake/Output Summary (Last 24 hours) at 01/06/2025 1323 Last data filed at 01/05/2025 1945 Gross per 24 hour  Intake 60 ml  Output --  Net 60 ml   Filed Weights   01/04/25 1301 01/06/25 0426  Weight: 46.7 kg 40.8 kg   Weight change: -5.9 kg Body mass index is 17.57 kg/m.   Physical Exam: General.  Frail and malnourished elderly lady, in no acute distress. Pulmonary.  Lungs clear bilaterally, normal respiratory effort. CV.  Regular rate and rhythm, no JVD, rub or murmur. Abdomen.  Soft, nontender, nondistended, BS positive. CNS.  Alert and oriented to self only.  No focal neurologic deficit. Extremities.  No edema, pulses intact and symmetrical.  Data Review: I have personally reviewed the laboratory data and studies available.  F/u labs ordered Unresulted Labs (From admission, onward)     Start     Ordered   01/04/25 1744  Vitamin B1  Add-on,   AD        01/04/25 1743           This record has been created using Dragon voice recognition software. Errors have been sought and corrected,but may not always be located. Such creation errors do not reflect on the standard of care.   Signed, Amaryllis Dare, MD [facility] 01/06/2025  "

## 2025-01-06 NOTE — Progress Notes (Signed)
 No sitters available for this patient during the day. AC says this patient will just be without a sitter. Order expires in 9 hours. Patient has been awake since 3 am and walking around the room. Patient has talked about being ready to go home.   RN notified Lavanda Horns, NP of the above information

## 2025-01-06 NOTE — Plan of Care (Signed)
  Problem: Education: Goal: Knowledge of General Education information will improve Description: Including pain rating scale, medication(s)/side effects and non-pharmacologic comfort measures Outcome: Not Progressing   Problem: Health Behavior/Discharge Planning: Goal: Ability to manage health-related needs will improve Outcome: Not Progressing   Problem: Coping: Goal: Level of anxiety will decrease Outcome: Not Progressing   Problem: Safety: Goal: Ability to remain free from injury will improve Outcome: Not Progressing   

## 2025-01-06 NOTE — Progress Notes (Signed)
 Patient has been oriented to self and place this shift. Interpreter used in all interactions with this patient. Patient intermittently wandering around the room and to the bathroom. Patient refusing to wear the nonskid socks.   Patient asked for food, RN explained that the kitchen is closed until 0630. RN listed the options available on the unit. Patient states I don't care, anything I am hungry RN provided graham crackers and apple juice. Patient then stated she wants soup from the restaurant. RN explained that we only have broth on this unit. RN provided chicken broth for this patient. Patient stated she would not eat this broth and would rather wait until breakfast.

## 2025-01-06 NOTE — TOC Progression Note (Addendum)
 Transition of Care [facility]) - Progression Note    Patient Details  Name: Robin Andrews MRN: [id number] Date of Birth: 1931-08-14  Transition [facility]) CM/SW Contact  Doneta Glenys ONEIDA, RN Phone Number: 01/06/2025, 2:33 PM  Clinical Narrative:    CM spoke with Phi and agreeable to SNF. Patient will need to be without a safety sitter for 24 to 48 hours before patient can transfer to a SNF. CM sent referrals via HUB. APS intake Artie Cardinal) completed.    Expected Discharge Plan: Skilled Nursing Facility Barriers to Discharge: Continued Medical Work up               Expected Discharge Plan and Services In-house Referral: NA Discharge Planning Services: CM Consult   Living arrangements for the past 2 months: Apartment                 DME Arranged: N/A DME Agency: NA       HH Arranged: NA HH Agency: NA         Social Drivers of Health (SDOH) Interventions SDOH Screenings   Housing: Low Risk (01/05/2025)  Social Connections: Unknown (01/05/2025)  Tobacco Use: Low Risk (01/04/2025)    Readmission Risk Interventions     No data to display

## 2025-01-06 NOTE — NC FL2 (Signed)
 " [place]  MEDICAID FL2 LEVEL OF CARE FORM     IDENTIFICATION  Patient Name: Robin Andrews Birthdate: 14-Nov-1931 Sex: female Admission Date (Current Location): 01/04/2025  [facility] and [place] Number:  Producer, Television/film/video and Address:  [facility],  [address] [place], [place] [address]      Provider Number: [id number]  Attending Physician Name and Address:  Caleen Qualia, MD  Relative Name and Phone Number:       Current Level of Care: Hospital Recommended Level of Care: Skilled Nursing Facility Prior Approval Number:    Date Approved/Denied:   PASRR Number: [id number]  Discharge Plan: SNF    Current Diagnoses: Patient Active Problem List   Diagnosis Date Noted   Confusion 01/06/2025   Altered mental status 01/04/2025   AKI (acute kidney injury) 06/16/2023   Iron deficiency anemia 06/16/2023   Acute CHF (congestive heart failure) (HCC) 06/15/2023   Elevated brain natriuretic peptide (BNP) level 06/15/2023   Hematoma of occipital region of scalp 08/11/2021   SDH (subdural hematoma) (HCC) 08/08/2021   Hyponatremia 01/29/2021   Acute metabolic encephalopathy 01/29/2021   Vertigo 04/18/2020   Noninfectious otitis externa of right ear 04/18/2020   Chronic neck pain 11/09/2019   Chronic bilateral low back pain without sciatica 11/09/2019   Hypokalemia    Type 2 diabetes mellitus with hyperlipidemia (HCC) 12/18/2017   Cardiac murmur 12/18/2017   Cervical disc disease 05/25/2012   Chronic pain syndrome 05/25/2012   Osteoarthritis 05/25/2012   Hyperlipidemia 05/30/2007   HYPERTENSION, BENIGN ESSENTIAL 05/30/2007   ASTEATOTIC ECZEMA 05/30/2007    Orientation RESPIRATION BLADDER Height & Weight     Self, Place  Normal Continent Weight: 40.8 kg (belongings tsken off bed.) Height:  5' (152.4 cm)  BEHAVIORAL SYMPTOMS/MOOD NEUROLOGICAL BOWEL NUTRITION STATUS      Continent Diet  AMBULATORY STATUS COMMUNICATION OF NEEDS Skin   Limited Assist Verbally  (Vietnamese) Normal                       Personal Care Assistance Level of Assistance  Bathing, Feeding, Dressing Bathing Assistance: Independent Feeding assistance: Independent Dressing Assistance: Limited assistance     Functional Limitations Info  Sight, Hearing, Speech Sight Info: Adequate Hearing Info: Adequate Speech Info: Adequate    SPECIAL CARE FACTORS FREQUENCY  PT (By licensed PT), OT (By licensed OT)     PT Frequency: 5x weekly OT Frequency: 5x weekly            Contractures Contractures Info: Not present    Additional Factors Info  Code Status, Allergies Code Status Info: DNR-Limited Allergies Info: Hydrochlororthiazide, Lisinopril           Current Medications (01/06/2025):  This is the current hospital active medication list Current Facility-Administered Medications  Medication Dose Route Frequency Provider Last Rate Last Admin   acetaminophen  (TYLENOL ) tablet 650 mg  650 mg Oral Q6H PRN Pokhrel, Laxman, MD   650 mg at 01/05/25 2006   Or   acetaminophen  (TYLENOL ) suppository 650 mg  650 mg Rectal Q6H PRN Pokhrel, Laxman, MD       amLODipine  (NORVASC ) tablet 5 mg  5 mg Oral Daily Pokhrel, Laxman, MD   5 mg at 01/06/25 0908   cephALEXin  (KEFLEX ) capsule 500 mg  500 mg Oral Q8H Amin, Sumayya, MD       enoxaparin  (LOVENOX ) injection 30 mg  30 mg Subcutaneous Q24H Pokhrel, Laxman, MD   30 mg at 01/04/25 2200  hydrALAZINE  (APRESOLINE ) injection 5 mg  5 mg Intravenous Q6H PRN Pokhrel, Laxman, MD       multivitamin with minerals tablet 1 tablet  1 tablet Oral Daily Pokhrel, Laxman, MD   1 tablet at 01/06/25 0909   ondansetron  (ZOFRAN ) tablet 4 mg  4 mg Oral Q6H PRN Pokhrel, Laxman, MD       Or   ondansetron  (ZOFRAN ) injection 4 mg  4 mg Intravenous Q6H PRN Pokhrel, Laxman, MD       Oral care mouth rinse  15 mL Mouth Rinse PRN Dahal, Binaya, MD       polyethylene glycol (MIRALAX  / GLYCOLAX ) packet 17 g  17 g Oral Daily PRN Pokhrel, Laxman, MD        potassium chloride  SA (KLOR-CON  M) CR tablet 40 mEq  40 mEq Oral Once Dahal, Binaya, MD       QUEtiapine  (SEROQUEL ) tablet 25 mg  25 mg Oral QHS Pokhrel, Laxman, MD   25 mg at 01/04/25 2200   sodium chloride  flush (NS) 0.9 % injection 3 mL  3 mL Intravenous Q12H Pokhrel, Laxman, MD   3 mL at 01/05/25 2229   sodium chloride  flush (NS) 0.9 % injection 3 mL  3 mL Intravenous PRN Pokhrel, Laxman, MD       thiamine  (VITAMIN B1) tablet 100 mg  100 mg Oral Daily Pokhrel, Laxman, MD   100 mg at 01/06/25 0908     Discharge Medications: Please see discharge summary for a list of discharge medications.  Relevant Imaging Results:  Relevant Lab Results:   Additional Information SSN [id number]  Doneta Glenys DASEN, RN     "

## 2025-01-07 DIAGNOSIS — I10 Essential (primary) hypertension: Secondary | ICD-10-CM | POA: Diagnosis not present

## 2025-01-07 DIAGNOSIS — R410 Disorientation, unspecified: Secondary | ICD-10-CM | POA: Diagnosis not present

## 2025-01-07 DIAGNOSIS — E785 Hyperlipidemia, unspecified: Secondary | ICD-10-CM | POA: Diagnosis not present

## 2025-01-07 DIAGNOSIS — E1169 Type 2 diabetes mellitus with other specified complication: Secondary | ICD-10-CM | POA: Diagnosis not present

## 2025-01-07 NOTE — Progress Notes (Signed)
 " PROGRESS NOTE  Robin Andrews  DOB: [birth year]  PCP: Luke Agent, MD (Inactive) FMW:[id number]  DOA: 01/04/2025  LOS: 0 days  Hospital Day: 4  Brief narrative: Robin Andrews is a 89 y.o. female with PMH significant for DM2, HTN, HLD, subdural hematoma  1/15, patient was wandering on the road, seen by bystanders and called EMS.  Patient reportedly said she was hungry and was just trying to walk to find something to eat. Later in the ED, grandson stated that patient prefers to live by herself and has been confused for last 3 weeks.  In the ED, she also mentioned some auditory hallucinations, thought that somebody was trying to kill her so that is why she was in the street.  Vital signs stable Labs with WC count 10.9, sodium 134 Urinalysis showed hazy yellow urine with small leukocytes, rare bacteria Ammonia level low at 13, TSH within normal range. CT head scan did not show any acute findings.   In the ED patient received Ativan  1 mg x 1.   Admitted to TRH.  1/17: Hemodynamically stable, TOC is looking for placement as family is unable to take care of her.  Concern of advanced dementia.  Switching ceftriaxone  with Keflex , no urine cultures but patient was keep pulling IVs and does not want anymore.  Subjective: Patient was seen and examined today.  Eating lunch.  No new concern.  Assessment and plan: Acute delirium  Wandering elderly Underlying dementia  Brought after she was noticed to be wandering on the road into traffic. On exam, no focal deficits.  Labs and imagings did not show any clear organic or metabolic findings. Vitamin B12, folate, TSH normal This is probably progressive dementia with delirium and psychotic symptoms. On low-dose Seroquel  25 at night for agitation.  Continue to monitor. Should avoid a sitter so she can go to rehab.  Possible UTI Urinalysis showed hazy yellow urine with small leukocytes, rare bacteria UTI might have precipitated delirium.  She was started  on Rocephin  but keep losing IV so it switched with Keflex .  No cultures were obtained.  Will do a 3-day course.  Mobility: Pending PT eval-recommending SNF  PT Orders: Active   PT Follow up Rec: Skilled Nursing-Short Term Rehab (<3 Hours/Day)01/05/2025 1400    Goals of care   Code Status: Limited: Do not attempt resuscitation (DNR) -DNR-LIMITED -Do Not Intubate/DNI      DVT prophylaxis:  enoxaparin  (LOVENOX ) injection 30 mg Start: 01/04/25 2200   Antimicrobials: None Fluid: None Consultants: None Family Communication: Tried calling grandson with no response  Status: Observation Level of care:  Med-Surg   Patient is from: Lives alone Needs to continue in-hospital care: Needs SNF placement. Anticipated d/c to: SNF    Diet:  Diet Order             DIET SOFT Room service appropriate? Yes; Fluid consistency: Thin  Diet effective now                   Scheduled Meds:  amLODipine   5 mg Oral Daily   cephALEXin   500 mg Oral Q8H   enoxaparin  (LOVENOX ) injection  30 mg Subcutaneous Q24H   multivitamin with minerals  1 tablet Oral Daily   potassium chloride   40 mEq Oral Once   QUEtiapine   25 mg Oral QHS   sodium chloride  flush  3 mL Intravenous Q12H   thiamine   100 mg Oral Daily    PRN meds: acetaminophen  **OR** acetaminophen , hydrALAZINE , ondansetron  **  OR** ondansetron  (ZOFRAN ) IV, mouth rinse, polyethylene glycol, sodium chloride  flush   Infusions:   Antimicrobials: Anti-infectives (From admission, onward)    Start     Dose/Rate Route Frequency Ordered Stop   01/06/25 1415  cephALEXin  (KEFLEX ) capsule 500 mg        500 mg Oral Every 8 hours 01/06/25 1323 01/09/25 1359   01/05/25 1615  cefTRIAXone  (ROCEPHIN ) 1 g in sodium chloride  0.9 % 100 mL IVPB  Status:  Discontinued        1 g 200 mL/hr over 30 Minutes Intravenous Every 24 hours 01/05/25 1515 01/06/25 1323       Objective: Vitals:   01/06/25 2128 01/07/25 0505  BP: (!) 149/76 130/60  Pulse: 79 72   Resp: 18 16  Temp: 98 F (36.7 C)   SpO2: 98% 98%    Intake/Output Summary (Last 24 hours) at 01/07/2025 1321 Last data filed at 01/07/2025 0858 Gross per 24 hour  Intake 120 ml  Output --  Net 120 ml   Filed Weights   01/04/25 1301 01/06/25 0426  Weight: 46.7 kg 40.8 kg   Weight change:  Body mass index is 17.57 kg/m.   Physical Exam: General.  Frail and malnourished elderly lady, in no acute distress. Pulmonary.  Lungs clear bilaterally, normal respiratory effort. CV.  Regular rate and rhythm, no JVD, rub or murmur. Abdomen.  Soft, nontender, nondistended, BS positive. CNS.  Alert and oriented to self.  No focal neurologic deficit. Extremities.  No edema, no cyanosis, pulses intact and symmetrical.   Data Review: I have personally reviewed the laboratory data and studies available.  F/u labs ordered Unresulted Labs (From admission, onward)     Start     Ordered   01/07/25 0500  Basic metabolic panel with GFR  Tomorrow morning,   R        01/06/25 1330           This record has been created using Conservation officer, historic buildings. Errors have been sought and corrected,but may not always be located. Such creation errors do not reflect on the standard of care.   Signed, Amaryllis Dare, MD [facility] 01/07/2025  "

## 2025-01-07 NOTE — Plan of Care (Signed)
  Problem: Clinical Measurements: Goal: Respiratory complications will improve Outcome: Progressing Goal: Cardiovascular complication will be avoided Outcome: Progressing   Problem: Coping: Goal: Level of anxiety will decrease Outcome: Progressing   Problem: Pain Managment: Goal: General experience of comfort will improve and/or be controlled Outcome: Progressing   Problem: Safety: Goal: Ability to remain free from injury will improve Outcome: Progressing

## 2025-01-08 DIAGNOSIS — E785 Hyperlipidemia, unspecified: Secondary | ICD-10-CM | POA: Diagnosis not present

## 2025-01-08 DIAGNOSIS — E1169 Type 2 diabetes mellitus with other specified complication: Secondary | ICD-10-CM | POA: Diagnosis not present

## 2025-01-08 DIAGNOSIS — I10 Essential (primary) hypertension: Secondary | ICD-10-CM | POA: Diagnosis not present

## 2025-01-08 DIAGNOSIS — R410 Disorientation, unspecified: Secondary | ICD-10-CM | POA: Diagnosis not present

## 2025-01-08 NOTE — Plan of Care (Signed)
  Problem: Clinical Measurements: Goal: Respiratory complications will improve Outcome: Progressing Goal: Cardiovascular complication will be avoided Outcome: Progressing   Problem: Activity: Goal: Risk for activity intolerance will decrease Outcome: Progressing   Problem: Coping: Goal: Level of anxiety will decrease Outcome: Progressing   Problem: Elimination: Goal: Will not experience complications related to urinary retention Outcome: Progressing   Problem: Pain Managment: Goal: General experience of comfort will improve and/or be controlled Outcome: Progressing   Problem: Safety: Goal: Ability to remain free from injury will improve Outcome: Progressing

## 2025-01-08 NOTE — Progress Notes (Signed)
 " PROGRESS NOTE  Robin Andrews  DOB: [birth year]  PCP: Luke Agent, MD (Inactive) FMW:[id number]  DOA: 01/04/2025  LOS: 0 days  Hospital Day: 5  Brief narrative: Robin Andrews is a 89 y.o. female with PMH significant for DM2, HTN, HLD, subdural hematoma  1/15, patient was wandering on the road, seen by bystanders and called EMS.  Patient reportedly said she was hungry and was just trying to walk to find something to eat. Later in the ED, grandson stated that patient prefers to live by herself and has been confused for last 3 weeks.  In the ED, she also mentioned some auditory hallucinations, thought that somebody was trying to kill her so that is why she was in the street.  Vital signs stable Labs with WC count 10.9, sodium 134 Urinalysis showed hazy yellow urine with small leukocytes, rare bacteria Ammonia level low at 13, TSH within normal range. CT head scan did not show any acute findings.   In the ED patient received Ativan  1 mg x 1.   Admitted to [facility].  1/17: Hemodynamically stable, TOC is looking for placement as family is unable to take care of her.  Concern of advanced dementia.  Switching ceftriaxone  with Keflex , no urine cultures but patient was keep pulling IVs and does not want anymore. 1/19: Remained pleasantly confused, no agitation.  Completed the course of antibiotic.  TOC is looking for placement.  Subjective: Patient was sitting at the side of bed when seen today.  Pleasant lady with no new concern.  Assessment and plan: Acute delirium  Wandering elderly Underlying dementia  Brought after she was noticed to be wandering on the road into traffic. On exam, no focal deficits.  Labs and imagings did not show any clear organic or metabolic findings. Vitamin B12, folate, TSH normal This is probably progressive dementia with delirium and psychotic symptoms. On low-dose Seroquel  25 at night for agitation.  Continue to monitor. Should avoid a sitter so she can go to  rehab.  Possible UTI Urinalysis showed hazy yellow urine with small leukocytes, rare bacteria UTI might have precipitated delirium.  Patient received a dose of ceftriaxone  followed by Keflex  for 2 more days.  Mobility: Pending PT eval-recommending SNF  PT Orders: Active   PT Follow up Rec: Skilled Nursing-Short Term Rehab (<3 Hours/Day)01/05/2025 1400    Goals of care   Code Status: Limited: Do not attempt resuscitation (DNR) -DNR-LIMITED -Do Not Intubate/DNI      DVT prophylaxis:  enoxaparin  (LOVENOX ) injection 30 mg Start: 01/04/25 2200   Antimicrobials: None Fluid: None Consultants: None Family Communication: APS is not involved  Status: Observation Level of care:  Med-Surg   Patient is from: Lives alone Needs to continue in-hospital care: Needs SNF placement. Anticipated d/c to: SNF    Diet:  Diet Order             DIET SOFT Room service appropriate? Yes; Fluid consistency: Thin  Diet effective now                   Scheduled Meds:  amLODipine   5 mg Oral Daily   cephALEXin   500 mg Oral Q8H   enoxaparin  (LOVENOX ) injection  30 mg Subcutaneous Q24H   multivitamin with minerals  1 tablet Oral Daily   potassium chloride   40 mEq Oral Once   QUEtiapine   25 mg Oral QHS   sodium chloride  flush  3 mL Intravenous Q12H   thiamine   100 mg Oral Daily  PRN meds: acetaminophen  **OR** acetaminophen , hydrALAZINE , ondansetron  **OR** ondansetron  (ZOFRAN ) IV, mouth rinse, polyethylene glycol, sodium chloride  flush   Infusions:   Antimicrobials: Anti-infectives (From admission, onward)    Start     Dose/Rate Route Frequency Ordered Stop   01/06/25 1415  cephALEXin  (KEFLEX ) capsule 500 mg        500 mg Oral Every 8 hours 01/06/25 1323 01/09/25 1359   01/05/25 1615  cefTRIAXone  (ROCEPHIN ) 1 g in sodium chloride  0.9 % 100 mL IVPB  Status:  Discontinued        1 g 200 mL/hr over 30 Minutes Intravenous Every 24 hours 01/05/25 1515 01/06/25 1323        Objective: Vitals:   01/07/25 1357 01/08/25 1352  BP:  (!) 150/70  Pulse: 73 77  Resp: 16 19  Temp:  98.4 F (36.9 C)  SpO2: 100% 100%    Intake/Output Summary (Last 24 hours) at 01/08/2025 1426 Last data filed at 01/07/2025 1916 Gross per 24 hour  Intake 120 ml  Output --  Net 120 ml   Filed Weights   01/04/25 1301 01/06/25 0426  Weight: 46.7 kg 40.8 kg   Weight change:  Body mass index is 17.57 kg/m.   Physical Exam: General.  Frail and malnourished elderly lady, in no acute distress. Pulmonary.  Lungs clear bilaterally, normal respiratory effort. CV.  Regular rate and rhythm, no JVD, rub or murmur. Abdomen.  Soft, nontender, nondistended, BS positive. CNS.  Alert .  No focal neurologic deficit. Extremities.  No edema, no cyanosis, pulses intact and symmetrical.   Data Review: I have personally reviewed the laboratory data and studies available.  F/u labs ordered Unresulted Labs (From admission, onward)    None      This record has been created using Conservation officer, historic buildings. Errors have been sought and corrected,but may not always be located. Such creation errors do not reflect on the standard of care.   Signed, Amaryllis Dare, MD [facility] 01/08/2025  "

## 2025-01-08 NOTE — TOC Progression Note (Signed)
 Transition of Care [facility]) - Progression Note    Patient Details  Name: Robin Andrews MRN: [id number] Date of Birth: [birth year]  [facility]) CM/SW Contact  [name], RN Phone Number:[phone]  01/08/2025, 1:29 PM  Clinical Narrative:    Currently there are no bed offers.   [facility] [place], [place] Preferred SNF  Declined Cannot meet patient's needs -- [address], [place] [address] [phone] --  [facility] Preferred SNF  Declined No bed thersa -- [address], [place] [address] [phone] --  [facility]  Declined Cannot meet patient's needs -- [address], [place] [address] [phone] --  [facility] Preferred SNF  Declined No bed availablity -- [address], [place] [address] [phone] --  [facility] [place] Preferred SNF  Declined Cannot meet patient's needs -- [address], [place] [address] [phone] --  [facility]  Declined Cannot meet patient's needs -- [address] [place] [address] [phone] --  [name]  Declined -- [address] [name] [address] [phone] --  [facility]  Declined Cannot meet patient's needs -- [address] [place] [address] [phone] --   Discharge Information (From adm   Expected Discharge Plan: Skilled Nursing Facility Barriers to Discharge: Continued Medical Work up               Expected Discharge Plan and Services In-house Referral: NA Discharge Planning Services: CM Consult   Living arrangements for the past 2 months: Apartment                 DME Arranged: N/A DME Agency: NA       HH Arranged: NA HH Agency: NA         Social Drivers of Health (SDOH) Interventions SDOH Screenings    Housing: Low Risk (01/05/2025)  Social Connections: Unknown (01/05/2025)  Tobacco Use: Low Risk (01/04/2025)    Readmission Risk Interventions     No data to display

## 2025-01-08 NOTE — Progress Notes (Signed)
 Robin Andrews is a 89 y.o. female has some money with her in a white envelope and was trying to give the money to anyone who enters her room to let her live. It was a called for concern so, security was called and the money was counted to the amount of $2900. This money was given to security. Patient will be given the money upon discharge.

## 2025-01-08 NOTE — Progress Notes (Signed)
 Patient refused medication this morning, labs, and vitals. Tele sitter present in room.

## 2025-01-09 DIAGNOSIS — E1169 Type 2 diabetes mellitus with other specified complication: Secondary | ICD-10-CM | POA: Diagnosis not present

## 2025-01-09 DIAGNOSIS — E785 Hyperlipidemia, unspecified: Secondary | ICD-10-CM | POA: Diagnosis not present

## 2025-01-09 DIAGNOSIS — R410 Disorientation, unspecified: Secondary | ICD-10-CM | POA: Diagnosis not present

## 2025-01-09 DIAGNOSIS — I10 Essential (primary) hypertension: Secondary | ICD-10-CM | POA: Diagnosis not present

## 2025-01-09 MED ORDER — HALOPERIDOL LACTATE 5 MG/ML IJ SOLN
1.0000 mg | Freq: Three times a day (TID) | INTRAMUSCULAR | Status: DC | PRN
  Administered 2025-01-09 – 2025-01-15 (×3): 1 mg via INTRAMUSCULAR
  Filled 2025-01-09 (×4): qty 1

## 2025-01-09 MED ORDER — HALOPERIDOL 1 MG PO TABS
1.0000 mg | ORAL_TABLET | Freq: Three times a day (TID) | ORAL | Status: DC | PRN
  Administered 2025-01-10 – 2025-01-16 (×6): 1 mg via ORAL
  Filled 2025-01-09 (×10): qty 1

## 2025-01-09 MED ORDER — QUETIAPINE FUMARATE 25 MG PO TABS: 50.0000 mg | ORAL_TABLET | Freq: Every day | ORAL | Status: DC

## 2025-01-09 MED ORDER — QUETIAPINE FUMARATE 25 MG PO TABS: 25.0000 mg | ORAL_TABLET | Freq: Two times a day (BID) | ORAL | Status: DC

## 2025-01-09 MED ORDER — QUETIAPINE FUMARATE 25 MG PO TABS
25.0000 mg | ORAL_TABLET | Freq: Two times a day (BID) | ORAL | Status: DC
  Administered 2025-01-09 – 2025-01-16 (×14): 25 mg via ORAL
  Filled 2025-01-09 (×15): qty 1

## 2025-01-09 NOTE — Progress Notes (Signed)
 Brief Curbside Psychiatry Consult Note  Received curbside consult from the attending physician regarding medication management and behavioral concerns. The attending reports the patient continues to wander and becomes intermittently agitated. She described episodes of emotional distress, including crying and at one point grabbing onto the attendings leg while pleading to go home.  As previously discussed, this is a patient with suspected dementia who had been living independently at home and presented after wandering and walking toward traffic. During this admission, she was found to have leukocytosis in the urine and was treated with antibiotics. Since treatment, her mentation has improved; however, she continues to demonstrate impaired capacity in other domains and likely lacks the ability to safely care for herself independently at home.  Given ongoing wandering and agitation, quetiapine  was increased to 25 mg PO BID. Recommendation is for discharge home with a personal care aide while longer-term placement options are explored. Her dementia and behavioral presentation were likely exacerbated in the setting of bacteriuria, which contributed to her acute presentation.  Prolonged hospitalization on a medical-surgical unit solely while awaiting placement, with intermittent agitation managed reactively, is not felt to be beneficial for this patient. Additionally, the current approach of avoiding safety measures (e.g., safety sitter or telesitter) and PRN medications to preserve eligibility for assisted living placement may inadvertently prolong hospitalization without improving outcomes. Continued inpatient management under these constraints is unlikely to be therapeutic.  Attempts were made to contact Phi Nguyen without success. APS was also contacted with an update, though no direct contact was made at this time. Per documentation, the patient reportedly had approximately $3,400 in her possession during  this admission; if accurate, these funds could potentially be used to support in-home services temporarily until appropriate placement is secured.  It is not felt to be futile to address agitation pharmacologically when the patient is distressed, wandering, and repeatedly expressing a desire to go home, particularly given that her bacteriuria likely played a significant role in the acute worsening of her cognitive and behavioral symptoms.

## 2025-01-09 NOTE — TOC Progression Note (Addendum)
 Transition of Care [facility]) - Progression Note    Patient Details  Name: Robin Andrews MRN: [id number] Date of Birth: [birth year]  Transition of Care [facility]) CM/SW Contact  Doneta Glenys ONEIDA, RN Phone Number: 01/09/2025, 12:15 PM  Clinical Narrative:    4:01 PM Genesis Harlen- will call CM back. CM contact [facility]- can't offer bed 12:15 PM CM called APS left message for Ryan Cardinal to call CM.   Expected Discharge Plan: Skilled Nursing Facility Barriers to Discharge: Continued Medical Work up               Expected Discharge Plan and Services In-house Referral: NA Discharge Planning Services: CM Consult   Living arrangements for the past 2 months: Apartment                 DME Arranged: N/A DME Agency: NA       HH Arranged: NA HH Agency: NA         Social Drivers of Health (SDOH) Interventions SDOH Screenings   Housing: Low Risk (01/05/2025)  Social Connections: Unknown (01/05/2025)  Tobacco Use: Low Risk (01/04/2025)    Readmission Risk Interventions     No data to display

## 2025-01-09 NOTE — Plan of Care (Signed)
  Problem: Clinical Measurements: Goal: Will remain free from infection Outcome: Progressing Goal: Diagnostic test results will improve Outcome: Progressing Goal: Respiratory complications will improve Outcome: Progressing   Problem: Nutrition: Goal: Adequate nutrition will be maintained Outcome: Progressing   

## 2025-01-09 NOTE — Progress Notes (Signed)
 Pt has $500 in cash on her person that was counted by patient in front of this RN and Careers Adviser, CHARITY FUNDRAISER. Security called and notified. Envelope requested to store cash to be brought down to security and held until patient discharged.

## 2025-01-09 NOTE — Progress Notes (Signed)
 Mobility Specialist - Progress Note:   01/09/25 1124  Mobility  Activity Ambulated with assistance  Level of Assistance  (HHA)  Distance Ambulated (ft) 400 ft  Activity Response Tolerated fair  Mobility Referral Yes  Mobility visit 1 Mobility  Mobility Specialist Start Time (ACUTE ONLY) 1110  Mobility Specialist Stop Time (ACUTE ONLY) 1118  Mobility Specialist Time Calculation (min) (ACUTE ONLY) 8 min   Pt was received in bed and agreed to mobility. Returned to bed with all needs met. Bed alarm on and call bell in reach.  [employer] - Mobility Specialist - Acute Rehabilitation Can be reached via [employer]

## 2025-01-09 NOTE — Progress Notes (Signed)
 Patients emergency contact/grandson present at bedside.  R updated him on plan of care, all questions answered.  Informed grandson that patient was found with large lump sum of cash and was placed in lock box in security.  @ 1415 items retrieved from security, counted and signed with patient present and witnessed per staff/this RN.  Total amount of cash returned $3400.00.  Per grandson patient has been living alone win apartment and apartment complex is worried for her safety.  Was told patient have been seen wandering on different occasions and felt she was unsafe.  Emergency contact in agreeance with plan of care for SNF placement at his times as he communicated the family can't provide 24 hour care and that his love one refuses to leave her home.  Care manager aware of patient/family dynamics, awaiting accepting facility.

## 2025-01-09 NOTE — Progress Notes (Signed)
 " PROGRESS NOTE  Robin Andrews  DOB: 12/28/1930  PCP: Luke Agent, MD (Inactive) FMW:[id number]  DOA: 01/04/2025  LOS: 0 days  Hospital Day: 6  Brief narrative: Robin Andrews is a 89 y.o. female with PMH significant for DM2, HTN, HLD, subdural hematoma  1/15, patient was wandering on the road, seen by bystanders and called EMS.  Patient reportedly said she was hungry and was just trying to walk to find something to eat. Later in the ED, grandson stated that patient prefers to live by herself and has been confused for last 3 weeks.  In the ED, she also mentioned some auditory hallucinations, thought that somebody was trying to kill her so that is why she was in the street.  Vital signs stable Labs with WC count 10.9, sodium 134 Urinalysis showed hazy yellow urine with small leukocytes, rare bacteria Ammonia level low at 13, TSH within normal range. CT head scan did not show any acute findings.   In the ED patient received Ativan  1 mg x 1.   Admitted to [facility].  1/17: Hemodynamically stable, TOC is looking for placement as family is unable to take care of her.  Concern of advanced dementia.  Switching ceftriaxone  with Keflex , no urine cultures but patient was keep pulling IVs and does not want anymore. 1/19: Remained pleasantly confused, no agitation.  Completed the course of antibiotic.  TOC is looking for placement. 1/20: Hemodynamically stable, becoming more agitated and keeps saying she wants to go home.  Psych increase the dose of Seroquel  to twice daily.  As needed Haldol  added.  Still pending placement  Subjective: Patient was seen and examined with the help of Vietnamese interpreter, she was just keep requesting to let her go home.  Assessment and plan: Acute delirium  Wandering elderly Underlying dementia  Brought after she was noticed to be wandering on the road into traffic. On exam, no focal deficits.  Labs and imagings did not show any clear organic or metabolic  findings. Vitamin B12, folate, TSH normal This is probably progressive dementia with delirium and psychotic symptoms. Seroquel  dose was increased to 25 mg twice daily Should avoid a sitter so she can go to rehab.  Possible UTI Urinalysis showed hazy yellow urine with small leukocytes, rare bacteria UTI might have precipitated delirium.  Patient received a dose of ceftriaxone  followed by Keflex  for 2 more days.  Mobility: Pending PT eval-recommending SNF  PT Orders: Active   PT Follow up Rec: Skilled Nursing-Short Term Rehab (<3 Hours/Day)01/05/2025 1400    Goals of care   Code Status: Limited: Do not attempt resuscitation (DNR) -DNR-LIMITED -Do Not Intubate/DNI      DVT prophylaxis:  enoxaparin  (LOVENOX ) injection 30 mg Start: 01/04/25 2200   Antimicrobials: None Fluid: None Consultants: None Family Communication: APS is  involved  Status: Observation Level of care:  Med-Surg   Patient is from: Lives alone Needs to continue in-hospital care: Needs SNF placement. Anticipated d/c to: SNF    Diet:  Diet Order             DIET SOFT Room service appropriate? Yes; Fluid consistency: Thin  Diet effective now                   Scheduled Meds:  amLODipine   5 mg Oral Daily   enoxaparin  (LOVENOX ) injection  30 mg Subcutaneous Q24H   multivitamin with minerals  1 tablet Oral Daily   potassium chloride   40 mEq Oral Once  QUEtiapine   25 mg Oral BID   thiamine   100 mg Oral Daily    PRN meds: acetaminophen  **OR** acetaminophen , haloperidol  **OR** haloperidol  lactate, hydrALAZINE , ondansetron  **OR** ondansetron  (ZOFRAN ) IV, mouth rinse, polyethylene glycol   Infusions:   Antimicrobials: Anti-infectives (From admission, onward)    Start     Dose/Rate Route Frequency Ordered Stop   01/06/25 1415  cephALEXin  (KEFLEX ) capsule 500 mg        500 mg Oral Every 8 hours 01/06/25 1323 01/09/25 1359   01/05/25 1615  cefTRIAXone  (ROCEPHIN ) 1 g in sodium chloride  0.9 % 100  mL IVPB  Status:  Discontinued        1 g 200 mL/hr over 30 Minutes Intravenous Every 24 hours 01/05/25 1515 01/06/25 1323       Objective: Vitals:   01/08/25 2008 01/09/25 0442  BP: (!) 155/69 135/67  Pulse: 81 72  Resp: 20 16  Temp: 98.2 F (36.8 C) 98.2 F (36.8 C)  SpO2: 98% 99%    Intake/Output Summary (Last 24 hours) at 01/09/2025 1442 Last data filed at 01/09/2025 1030 Gross per 24 hour  Intake 240 ml  Output --  Net 240 ml   Filed Weights   01/04/25 1301 01/06/25 0426 01/09/25 0442  Weight: 46.7 kg 40.8 kg 41.8 kg   Weight change:  Body mass index is 18 kg/m.   Physical Exam: General.  Frail elderly lady, in no acute distress. Pulmonary.  Lungs clear bilaterally, normal respiratory effort. CV.  Regular rate and rhythm, no JVD, rub or murmur. Abdomen.  Soft, nontender, nondistended, BS positive. CNS.  Alert and oriented to self, does know that she is not at home.  No focal neurologic deficit. Extremities.  No edema, pulses intact and symmetrical. Psychiatry.  Judgment and insight appears normal.    Data Review: I have personally reviewed the laboratory data and studies available.  F/u labs ordered Unresulted Labs (From admission, onward)    None      This record has been created using Conservation officer, historic buildings. Errors have been sought and corrected,but may not always be located. Such creation errors do not reflect on the standard of care.   Signed, Amaryllis Dare, MD [facility] 01/09/2025  "

## 2025-01-10 DIAGNOSIS — F0392 Unspecified dementia, unspecified severity, with psychotic disturbance: Secondary | ICD-10-CM | POA: Diagnosis present

## 2025-01-10 DIAGNOSIS — F03918 Unspecified dementia, unspecified severity, with other behavioral disturbance: Secondary | ICD-10-CM | POA: Diagnosis present

## 2025-01-10 DIAGNOSIS — R531 Weakness: Secondary | ICD-10-CM | POA: Diagnosis present

## 2025-01-10 DIAGNOSIS — R410 Disorientation, unspecified: Secondary | ICD-10-CM | POA: Diagnosis not present

## 2025-01-10 DIAGNOSIS — E871 Hypo-osmolality and hyponatremia: Secondary | ICD-10-CM | POA: Diagnosis present

## 2025-01-10 DIAGNOSIS — E7849 Other hyperlipidemia: Secondary | ICD-10-CM | POA: Diagnosis present

## 2025-01-10 DIAGNOSIS — Z888 Allergy status to other drugs, medicaments and biological substances status: Secondary | ICD-10-CM | POA: Diagnosis not present

## 2025-01-10 DIAGNOSIS — Z9181 History of falling: Secondary | ICD-10-CM | POA: Diagnosis not present

## 2025-01-10 DIAGNOSIS — Z9183 Wandering in diseases classified elsewhere: Secondary | ICD-10-CM | POA: Diagnosis not present

## 2025-01-10 DIAGNOSIS — N390 Urinary tract infection, site not specified: Secondary | ICD-10-CM | POA: Diagnosis present

## 2025-01-10 DIAGNOSIS — Z981 Arthrodesis status: Secondary | ICD-10-CM | POA: Diagnosis not present

## 2025-01-10 DIAGNOSIS — I10 Essential (primary) hypertension: Secondary | ICD-10-CM | POA: Diagnosis present

## 2025-01-10 DIAGNOSIS — Z79899 Other long term (current) drug therapy: Secondary | ICD-10-CM | POA: Diagnosis not present

## 2025-01-10 DIAGNOSIS — Z66 Do not resuscitate: Secondary | ICD-10-CM | POA: Diagnosis present

## 2025-01-10 DIAGNOSIS — E1169 Type 2 diabetes mellitus with other specified complication: Secondary | ICD-10-CM | POA: Diagnosis present

## 2025-01-10 DIAGNOSIS — Z602 Problems related to living alone: Secondary | ICD-10-CM | POA: Diagnosis present

## 2025-01-10 DIAGNOSIS — F05 Delirium due to known physiological condition: Secondary | ICD-10-CM | POA: Diagnosis present

## 2025-01-10 DIAGNOSIS — Z603 Acculturation difficulty: Secondary | ICD-10-CM | POA: Diagnosis present

## 2025-01-10 MED ORDER — HYDRALAZINE HCL 10 MG PO TABS: 10.0000 mg | ORAL_TABLET | Freq: Three times a day (TID) | ORAL | Status: DC | PRN

## 2025-01-10 NOTE — Plan of Care (Signed)
  Problem: Health Behavior/Discharge Planning: Goal: Ability to manage health-related needs will improve Outcome: Not Progressing   Problem: Coping: Goal: Level of anxiety will decrease Outcome: Not Progressing   

## 2025-01-10 NOTE — Plan of Care (Signed)

## 2025-01-10 NOTE — Consult Note (Signed)
" °  The patient is a 89 year old female seen in follow-up for safety concerns related to recent wandering behavior. Psychiatry contacted the patients grandson to provide an update and discuss disposition planning. We reviewed the option of short-term private-pay supervision using available funds (approximately $3,400) while longer-term community placement options are explored. The grandson was agreeable to this approach.  At the time of reassessment, the patients mental status had improved. Delirium, which likely contributed to recent wandering behaviors in the setting of underlying cognitive impairment, appears to be resolving. The patient is more redirectable and able to engage in meaningful conversation.  When asked what brought her to the hospital, the patient stated that she believed she was being chased by three Vietnamese/Lao men and one Caucasian man, prompting her to run toward traffic in an effort to obtain help. These statements are felt to be consistent with prior delirium and perceptual disturbance rather than fixed psychosis. At present, the patient denies ongoing fear or perceptual disturbances.  When asked about current treatment preferences, the patient stated that she wants to go home, reporting that she feels better and that nothing is wrong. She denies depressive symptoms but expresses significant frustration regarding continued hospitalization, becoming tearful at times and repeatedly requesting discharge. She reports that she is able to care for herself and states that she can contact her children or grandchildren if she needs assistance.  Risk assessment at this time does not demonstrate acute suicidal or homicidal ideation. While the patient remains at increased risk due to age and cognitive vulnerability, she demonstrates decision-making capacity regarding discharge planning and understanding of available support options. The goal remains to avoid prolonged hospitalization or  restrictive interventions when safe alternatives are available.  Capacity Assessment  The patient demonstrates decision-making capacity regarding her discharge and living situation. Although advanced age and cognitive vulnerability are acknowledged, the patient is able to clearly communicate a choice, understands her current circumstances, and articulates a consistent plan for self-care. She is able to explain how she meets her basic needs and identifies appropriate supports, stating that she can contact her children or grandchildren if assistance is needed. While the patient does not fully appreciate the safety risks associated with recent wandering behaviors, disagreement with or limited insight into risk does not negate capacity. Capacity is decision-specific and does not require that the patient make decisions others agree with. Notably, the patient has lived independently for over 20 years following the death of her husband, which supports her baseline functional capacity. At this time, she retains capacity despite ongoing safety concerns, and the recommended approach is least-restrictive planning, including short-term private-pay supervision while longer-term community placement is pursued.   A Vietnamese interpreter device was utilized via [employer] to it consultant during communication with the patient. [facility] # [phone] "

## 2025-01-10 NOTE — TOC Progression Note (Incomplete Revision)
 Transition of Care [facility]) - Progression Note    Patient Details  Name: Robin Andrews MRN: [id number] Date of Birth: [birth year]  [facility]) CM/SW Contact  Doneta Glenys ONEIDA, RN Phone Number: 01/10/2025, 11:12 AM  Clinical Narrative:    1:44 PM CM has called Gery Seip, [facility], [place] - left message. [facility] can't accept additional Medicaid . [facility] pay. 11:12 AM CM expanded search to [facility] Care SNF for bed offers.   Expected Discharge Plan: Skilled Nursing Facility Barriers to Discharge: Continued Medical Work up               Expected Discharge Plan and Services In-house Referral: NA Discharge Planning Services: CM Consult   Living arrangements for the past 2 months: Apartment                 DME Arranged: N/A DME Agency: NA       HH Arranged: NA HH Agency: NA         Social Drivers of Health (SDOH) Interventions SDOH Screenings   Housing: Low Risk (01/05/2025)  Social Connections: Unknown (01/05/2025)  Tobacco Use: Low Risk (01/04/2025)    Readmission Risk Interventions     No data to display

## 2025-01-10 NOTE — TOC Progression Note (Signed)
 Transition of Care [facility]) - Progression Note    Patient Details  Name: Robin Andrews MRN: [id number] Date of Birth: 1931/12/11  [facility]) CM/SW Contact  Doneta Glenys ONEIDA, RN Phone Number: 01/10/2025, 11:12 AM  Clinical Narrative:    CM expanded search to Memory Care SNF for bed offers.   Expected Discharge Plan: Skilled Nursing Facility Barriers to Discharge: Continued Medical Work up               Expected Discharge Plan and Services In-house Referral: NA Discharge Planning Services: CM Consult   Living arrangements for the past 2 months: Apartment                 DME Arranged: N/A DME Agency: NA       HH Arranged: NA HH Agency: NA         Social Drivers of Health (SDOH) Interventions SDOH Screenings   Housing: Low Risk (01/05/2025)  Social Connections: Unknown (01/05/2025)  Tobacco Use: Low Risk (01/04/2025)    Readmission Risk Interventions     No data to display

## 2025-01-10 NOTE — Progress Notes (Signed)
 " PROGRESS NOTE    Robin Andrews  FMW:[id number] DOB: 29-Jun-1931 DOA: 01/04/2025 PCP: Luke Agent, MD (Inactive)     Brief Narrative:  Robin Andrews is a 89 y.o. female with PMH significant for DM2, HTN, HLD, subdural hematoma  1/15, patient was wandering on the road, seen by bystanders and called EMS.  Patient reportedly said she was hungry and was just trying to walk to find something to eat. Later in the ED, grandson stated that patient prefers to live by herself and has been confused for last 3 weeks.  In the ED, she also mentioned some auditory hallucinations, thought that somebody was trying to kill her so that is why she was in the street.  Psychiatry also consulted.  New events last 24 hours / Subjective: Patient seen leaving her room.  With assistance of aide, patient returned back to her room.  Telemetry sitter is also in her room.  Patient examined with help of iPad interpreter.  Patient continues to express her desire to go home.  She is oriented to self, place, month December.  She is unable to tell me why she is in the hospital.  She tells me that 4 people try to monitor her at home.  Assessment & Plan:   Principal Problem:   Altered mental status Active Problems:   Type 2 diabetes mellitus with hyperlipidemia (HCC)   Hyperlipidemia   HYPERTENSION, BENIGN ESSENTIAL   Confusion   Acute delirium with underlying dementia - Appreciate psychiatry.  Discussed with the [facility] today. - Patient is unsafe to be living independently at home with wandering behavior - Seroquel  - Safe disposition pending  Possible UTI, pyuria - Received Rocephin /Keflex   DVT prophylaxis:  enoxaparin  (LOVENOX ) injection 30 mg Start: 01/04/25 2200  Code Status: DNR Family Communication: None at bedside Disposition Plan: Placement is pending Status is: Inpatient Remains inpatient appropriate because: Safe disposition pending    Antimicrobials:  Anti-infectives (From admission, onward)    Start      Dose/Rate Route Frequency Ordered Stop   01/06/25 1415  cephALEXin  (KEFLEX ) capsule 500 mg        500 mg Oral Every 8 hours 01/06/25 1323 01/09/25 1359   01/05/25 1615  cefTRIAXone  (ROCEPHIN ) 1 g in sodium chloride  0.9 % 100 mL IVPB  Status:  Discontinued        1 g 200 mL/hr over 30 Minutes Intravenous Every 24 hours 01/05/25 1515 01/06/25 1323        Objective: Vitals:   01/08/25 2008 01/09/25 0442 01/09/25 2041 01/10/25 1352  BP:  135/67 (!) 146/75 (!) 161/65  Pulse:  72 78 79  Resp: 20 16 18    Temp:  98.2 F (36.8 C) 98 F (36.7 C) (!) 97.5 F (36.4 C)  TempSrc:  Oral Oral Oral  SpO2: 98% 99% 100%   Weight:  41.8 kg    Height:       No intake or output data in the 24 hours ending 01/10/25 1601 Filed Weights   01/04/25 1301 01/06/25 0426 01/09/25 0442  Weight: 46.7 kg 40.8 kg 41.8 kg    Examination:  General exam: Appears calm and comfortable  Psychiatry: Judgement and insight appear  poor   Data Reviewed: I have personally reviewed following labs and imaging studies  CBC: Recent Labs  Lab 01/04/25 1430 01/05/25 0500  WBC 10.9* 9.8  NEUTROABS 8.7*  --   HGB 11.6* 10.1*  HCT 35.6* 30.1*  MCV 91.0 90.9  PLT 379 314  Basic Metabolic Panel: Recent Labs  Lab 01/04/25 1430 01/05/25 0500  NA 134* 138  K 3.5 3.4*  CL 94* 99  CO2 25 26  GLUCOSE 164* 119*  BUN 16 16  CREATININE 1.38* 1.42*  CALCIUM 9.6 9.7   GFR: Estimated Creatinine Clearance: 17 mL/min (A) (by C-G formula based on SCr of 1.42 mg/dL (H)). Liver Function Tests: Recent Labs  Lab 01/04/25 1430  AST 36  ALT 11  ALKPHOS 96  BILITOT 0.5  PROT 8.2*  ALBUMIN 4.6   No results for input(s): LIPASE, AMYLASE in the last 168 hours. Recent Labs  Lab 01/04/25 1631  AMMONIA <13   Coagulation Profile: No results for input(s): INR, PROTIME in the last 168 hours. Cardiac Enzymes: No results for input(s): CKTOTAL, CKMB, CKMBINDEX, TROPONINI in the last 168 hours. BNP  (last 3 results) No results for input(s): PROBNP in the last 8760 hours. HbA1C: No results for input(s): HGBA1C in the last 72 hours. CBG: No results for input(s): GLUCAP in the last 168 hours. Lipid Profile: No results for input(s): CHOL, HDL, LDLCALC, TRIG, CHOLHDL, LDLDIRECT in the last 72 hours. Thyroid  Function Tests: No results for input(s): TSH, T4TOTAL, FREET4, T3FREE, THYROIDAB in the last 72 hours. Anemia Panel: No results for input(s): VITAMINB12, FOLATE, FERRITIN, TIBC, IRON, RETICCTPCT in the last 72 hours. Sepsis Labs: No results for input(s): PROCALCITON, LATICACIDVEN in the last 168 hours.  No results found for this or any previous visit (from the past 240 hours).    Radiology Studies: No results found.    Scheduled Meds:  amLODipine   5 mg Oral Daily   enoxaparin  (LOVENOX ) injection  30 mg Subcutaneous Q24H   multivitamin with minerals  1 tablet Oral Daily   potassium chloride   40 mEq Oral Once   QUEtiapine   25 mg Oral BID   thiamine   100 mg Oral Daily   Continuous Infusions:   LOS: 0 days   Time spent: 30 minutes   Delon Hoe, DO [facility] 01/10/2025, 4:01 PM   Available via Epic secure chat 7am-7pm After these hours, please refer to coverage provider listed on [email or web address]  "

## 2025-01-10 NOTE — Plan of Care (Signed)
   Problem: Education: Goal: Knowledge of General Education information will improve Description: Including pain rating scale, medication(s)/side effects and non-pharmacologic comfort measures Outcome: Not Progressing

## 2025-01-11 DIAGNOSIS — R410 Disorientation, unspecified: Secondary | ICD-10-CM | POA: Diagnosis not present

## 2025-01-11 NOTE — Progress Notes (Signed)
 Interpreter used ID #[id number].  Pt ambulatory and walking in hallway. Pt begin to scream I go home multiple times. Pt attempting to touch doctors and tell them I go home now  staff able to direct pt back to room. Called interpreter.  This RN spoke to patient and explained to patient that she is safe and that the team is working to get her home. This RN attempted to educate patient about plan of care and discharge. Pt anxious and nervous. After speaking with interpreter pt became more calm and redirectable. Distractions and food provided to patient. Pt in no acute distress.   Spoke with grandson during shift to update about pts status and encouraged phone calls. The pt was able to speak to grandson and he said she continued to ask him about picking her up. This RN encouraged visitation. Grandson stated that he would try to visit when possible. The pts grandson stated that he has work.

## 2025-01-11 NOTE — TOC Progression Note (Signed)
 Transition of Care [facility]) - Progression Note    Patient Details  Name: Robin Andrews MRN: [id number] Date of Birth: 01/14/1931  Transition of Care [facility]) CM/SW Contact  Doneta Glenys ONEIDA, RN Phone Number: 01/11/2025, 10:48 AM  Clinical Narrative:    CM called [facility]- will call CM back.    Expected Discharge Plan: Skilled Nursing Facility Barriers to Discharge: Continued Medical Work up               Expected Discharge Plan and Services In-house Referral: NA Discharge Planning Services: CM Consult   Living arrangements for the past 2 months: Apartment                 DME Arranged: N/A DME Agency: NA       HH Arranged: NA HH Agency: NA         Social Drivers of Health (SDOH) Interventions SDOH Screenings   Housing: Low Risk (01/05/2025)  Social Connections: Unknown (01/05/2025)  Tobacco Use: Low Risk (01/04/2025)    Readmission Risk Interventions     No data to display

## 2025-01-11 NOTE — Progress Notes (Signed)
 PT Cancellation Note  Patient Details Name: Robin Andrews MRN: [id number] DOB: [birth year]   Cancelled Treatment:    Reason Eval/Treat Not Completed: PT screened, no needs identified, will sign off. Pt is currently independent in the room and has a sitter who also confirmed. PT will signoff as all mobility goals have been met.   Juanpablo Ciresi Kerstine 01/11/2025, 11:23 AM

## 2025-01-11 NOTE — Progress Notes (Signed)
 " PROGRESS NOTE    Robin Andrews  FMW:[id number] DOB: 01/23/1931 DOA: 01/04/2025 PCP: Luke Agent, MD (Inactive)     Brief Narrative:  Robin Andrews is a 89 y.o. female with PMH significant for DM2, HTN, HLD, subdural hematoma  1/15, patient was wandering on the road, seen by bystanders and called EMS.  Patient reportedly said she was hungry and was just trying to walk to find something to eat. Later in the ED, grandson stated that patient prefers to live by herself and has been confused for last 3 weeks.  In the ED, she also mentioned some auditory hallucinations, thought that somebody was trying to kill her so that is why she was in the street.  Psychiatry also consulted.  New events last 24 hours / Subjective: Patient seen with assistance of interpreter.  She continues to voice her desire to want to go home.  Per psychiatry, patient has capacity to make decisions.  Assessment & Plan:   Principal Problem:   Altered mental status Active Problems:   Type 2 diabetes mellitus with hyperlipidemia (HCC)   Hyperlipidemia   HYPERTENSION, BENIGN ESSENTIAL   Confusion   Acute delirium with underlying dementia - Appreciate psychiatry.  Discussed with the [facility] today. - Patient is unsafe to be living independently at home with wandering behavior - Seroquel  - Safe disposition pending  Possible UTI, pyuria - Received Rocephin /Keflex   DVT prophylaxis:  enoxaparin  (LOVENOX ) injection 30 mg Start: 01/04/25 2200  Code Status: DNR Family Communication: None at bedside Disposition Plan: Placement is pending Status is: Inpatient Remains inpatient appropriate because: Safe disposition pending    Antimicrobials:  Anti-infectives (From admission, onward)    Start     Dose/Rate Route Frequency Ordered Stop   01/06/25 1415  cephALEXin  (KEFLEX ) capsule 500 mg        500 mg Oral Every 8 hours 01/06/25 1323 01/09/25 1359   01/05/25 1615  cefTRIAXone  (ROCEPHIN ) 1 g in sodium chloride  0.9 % 100 mL  IVPB  Status:  Discontinued        1 g 200 mL/hr over 30 Minutes Intravenous Every 24 hours 01/05/25 1515 01/06/25 1323        Objective: Vitals:   01/10/25 2252 01/11/25 0453 01/11/25 0500 01/11/25 0907  BP: (!) 151/63 133/64  (!) 155/71  Pulse: 73 77  81  Resp: 18 18    Temp: 98.1 F (36.7 C) 97.7 F (36.5 C)    TempSrc: Oral Oral    SpO2: 99% 99%    Weight:   45.3 kg   Height:        Intake/Output Summary (Last 24 hours) at 01/11/2025 1041 Last data filed at 01/11/2025 0500 Gross per 24 hour  Intake 360 ml  Output --  Net 360 ml   Filed Weights   01/06/25 0426 01/09/25 0442 01/11/25 0500  Weight: 40.8 kg 41.8 kg 45.3 kg    Examination:  General exam: Appears calm and comfortable  Psychiatry: Judgement and insight appear  poor   Data Reviewed: I have personally reviewed following labs and imaging studies  CBC: Recent Labs  Lab 01/04/25 1430 01/05/25 0500  WBC 10.9* 9.8  NEUTROABS 8.7*  --   HGB 11.6* 10.1*  HCT 35.6* 30.1*  MCV 91.0 90.9  PLT 379 314   Basic Metabolic Panel: Recent Labs  Lab 01/04/25 1430 01/05/25 0500  NA 134* 138  K 3.5 3.4*  CL 94* 99  CO2 25 26  GLUCOSE 164* 119*  BUN  16 16  CREATININE 1.38* 1.42*  CALCIUM 9.6 9.7   GFR: Estimated Creatinine Clearance: 18.5 mL/min (A) (by C-G formula based on SCr of 1.42 mg/dL (H)). Liver Function Tests: Recent Labs  Lab 01/04/25 1430  AST 36  ALT 11  ALKPHOS 96  BILITOT 0.5  PROT 8.2*  ALBUMIN 4.6   No results for input(s): LIPASE, AMYLASE in the last 168 hours. Recent Labs  Lab 01/04/25 1631  AMMONIA <13   Coagulation Profile: No results for input(s): INR, PROTIME in the last 168 hours. Cardiac Enzymes: No results for input(s): CKTOTAL, CKMB, CKMBINDEX, TROPONINI in the last 168 hours. BNP (last 3 results) No results for input(s): PROBNP in the last 8760 hours. HbA1C: No results for input(s): HGBA1C in the last 72 hours. CBG: No results for  input(s): GLUCAP in the last 168 hours. Lipid Profile: No results for input(s): CHOL, HDL, LDLCALC, TRIG, CHOLHDL, LDLDIRECT in the last 72 hours. Thyroid  Function Tests: No results for input(s): TSH, T4TOTAL, FREET4, T3FREE, THYROIDAB in the last 72 hours. Anemia Panel: No results for input(s): VITAMINB12, FOLATE, FERRITIN, TIBC, IRON, RETICCTPCT in the last 72 hours. Sepsis Labs: No results for input(s): PROCALCITON, LATICACIDVEN in the last 168 hours.  No results found for this or any previous visit (from the past 240 hours).    Radiology Studies: No results found.    Scheduled Meds:  amLODipine   5 mg Oral Daily   enoxaparin  (LOVENOX ) injection  30 mg Subcutaneous Q24H   multivitamin with minerals  1 tablet Oral Daily   potassium chloride   40 mEq Oral Once   QUEtiapine   25 mg Oral BID   thiamine   100 mg Oral Daily   Continuous Infusions:   LOS: 1 day   Time spent: 25 minutes   Delon Hoe, DO [facility] 01/11/2025, 10:41 AM   Available via Epic secure chat 7am-7pm After these hours, please refer to coverage provider listed on [email or web address]  "

## 2025-01-11 NOTE — Progress Notes (Signed)
 Interpreter used to establish rapport and complete orientation questions. Pt alert and oriented x3, denies pain. Ambulatory. Requested food. Pt in no acute distress.  ID: [id number]. Pt answered questions appropriately but became visible anxious and upset and stated Jesus been here ten days you keep asking me this question   Interpreter services ended.

## 2025-01-12 DIAGNOSIS — R410 Disorientation, unspecified: Secondary | ICD-10-CM | POA: Diagnosis not present

## 2025-01-12 MED ORDER — POLYVINYL ALCOHOL 1.4 % OP SOLN
1.0000 [drp] | OPHTHALMIC | Status: DC | PRN
  Administered 2025-01-12 – 2025-01-16 (×4): 1 [drp] via OPHTHALMIC
  Filled 2025-01-12: qty 15

## 2025-01-12 NOTE — Progress Notes (Signed)
 Pt agitated. Pt pacing in hallway screaming go home and attempting to touch staff members in hallway. Pt was in nurses station and unable to be redirected back to her room. Interpreter services used and pt constantly repeating to interpreter that she wants to go home. This RN told patient that the team is working to get her home safely and will call her grandson.  pt unable to communicate appropriately with interpreter as she is anxious and agitated at this time. MD notified for safety sitter. See new orders.

## 2025-01-12 NOTE — TOC Progression Note (Signed)
 Transition of Care [facility]) - Progression Note    Patient Details  Name: [name] MRN: [id number] Date of Birth: Jan 01, 1931  Transition of Care [facility]) CM/SW Contact  Doneta Glenys ONEIDA, RN Phone Number: 01/12/2025, 11:07 AM  Clinical Narrative:    CM contacted  [place] - waiting for call back [place] -No [place] -No [place] -No CM called Phi (grandson) [phone] - Left Message    Expected Discharge Plan: Skilled Nursing Facility Barriers to Discharge: Continued Medical Work up               Expected Discharge Plan and Services In-house Referral: NA Discharge Planning Services: CM Consult   Living arrangements for the past 2 months: Apartment                 DME Arranged: N/A DME Agency: NA       HH Arranged: NA HH Agency: NA         Social Drivers of Health (SDOH) Interventions SDOH Screenings   Housing: Low Risk (01/05/2025)  Social Connections: Unknown (01/05/2025)  Tobacco Use: Low Risk (01/04/2025)    Readmission Risk Interventions     No data to display

## 2025-01-12 NOTE — Progress Notes (Signed)
 " PROGRESS NOTE    Robin Andrews  FMW:[id number] DOB: 1931/11/19 DOA: 01/04/2025 PCP: Luke Agent, MD (Inactive)     Brief Narrative:  Robin Andrews is a 89 y.o. female with PMH significant for DM2, HTN, HLD, subdural hematoma  1/15, patient was wandering on the road, seen by bystanders and called EMS.  Patient reportedly said she was hungry and was just trying to walk to find something to eat. Later in the ED, grandson stated that patient prefers to live by herself and has been confused for last 3 weeks.  In the ED, she also mentioned some auditory hallucinations, thought that somebody was trying to kill her so that is why she was in the street.  Psychiatry also consulted.  New events last 24 hours / Subjective: Patient seen sleeping in bed. Sitter is at bedside. Per nursing, patient has been getting more agitated, continually asking to go home.   Assessment & Plan:   Principal Problem:   Altered mental status Active Problems:   Type 2 diabetes mellitus with hyperlipidemia (HCC)   Hyperlipidemia   HYPERTENSION, BENIGN ESSENTIAL   Confusion   Acute delirium with underlying dementia - Appreciate psychiatry.  Discussed with the [facility] today. - Patient is unsafe to be living independently at home with wandering behavior - Seroquel  - Safe disposition pending  Possible UTI, pyuria - Received Rocephin /Keflex   DVT prophylaxis:  enoxaparin  (LOVENOX ) injection 30 mg Start: 01/04/25 2200  Code Status: DNR Family Communication: None at bedside, discussed with grandson by phone today  Disposition Plan: Placement is pending Status is: Inpatient Remains inpatient appropriate because: Safe disposition pending    Antimicrobials:  Anti-infectives (From admission, onward)    Start     Dose/Rate Route Frequency Ordered Stop   01/06/25 1415  cephALEXin  (KEFLEX ) capsule 500 mg        500 mg Oral Every 8 hours 01/06/25 1323 01/09/25 1359   01/05/25 1615  cefTRIAXone  (ROCEPHIN ) 1 g in sodium  chloride 0.9 % 100 mL IVPB  Status:  Discontinued        1 g 200 mL/hr over 30 Minutes Intravenous Every 24 hours 01/05/25 1515 01/06/25 1323        Objective: Vitals:   01/11/25 1421 01/11/25 2019 01/12/25 0507 01/12/25 0916  BP: (!) 140/54 (!) 141/63 (!) 148/69 (!) 146/66  Pulse: 77 72 79 81  Resp: 12 18 18    Temp: 98.3 F (36.8 C) 98.2 F (36.8 C) 97.6 F (36.4 C)   TempSrc: Oral Oral Axillary   SpO2: 100% 98% 97%   Weight:      Height:       No intake or output data in the 24 hours ending 01/12/25 1251  Filed Weights   01/06/25 0426 01/09/25 0442 01/11/25 0500  Weight: 40.8 kg 41.8 kg 45.3 kg    Examination:  General exam: Appears calm and comfortable  Psychiatry: Judgement and insight appear  poor   Data Reviewed: I have personally reviewed following labs and imaging studies  CBC: No results for input(s): WBC, NEUTROABS, HGB, HCT, MCV, PLT in the last 168 hours.  Basic Metabolic Panel: No results for input(s): NA, K, CL, CO2, GLUCOSE, BUN, CREATININE, CALCIUM, MG, PHOS in the last 168 hours.  GFR: Estimated Creatinine Clearance: 18.5 mL/min (A) (by C-G formula based on SCr of 1.42 mg/dL (H)). Liver Function Tests: No results for input(s): AST, ALT, ALKPHOS, BILITOT, PROT, ALBUMIN in the last 168 hours.  No results for input(s): LIPASE, AMYLASE  in the last 168 hours. No results for input(s): AMMONIA in the last 168 hours.  Coagulation Profile: No results for input(s): INR, PROTIME in the last 168 hours. Cardiac Enzymes: No results for input(s): CKTOTAL, CKMB, CKMBINDEX, TROPONINI in the last 168 hours. BNP (last 3 results) No results for input(s): PROBNP in the last 8760 hours. HbA1C: No results for input(s): HGBA1C in the last 72 hours. CBG: No results for input(s): GLUCAP in the last 168 hours. Lipid Profile: No results for input(s): CHOL, HDL, LDLCALC, TRIG, CHOLHDL,  LDLDIRECT in the last 72 hours. Thyroid  Function Tests: No results for input(s): TSH, T4TOTAL, FREET4, T3FREE, THYROIDAB in the last 72 hours. Anemia Panel: No results for input(s): VITAMINB12, FOLATE, FERRITIN, TIBC, IRON, RETICCTPCT in the last 72 hours. Sepsis Labs: No results for input(s): PROCALCITON, LATICACIDVEN in the last 168 hours.  No results found for this or any previous visit (from the past 240 hours).    Radiology Studies: No results found.    Scheduled Meds:  amLODipine   5 mg Oral Daily   enoxaparin  (LOVENOX ) injection  30 mg Subcutaneous Q24H   multivitamin with minerals  1 tablet Oral Daily   potassium chloride   40 mEq Oral Once   QUEtiapine   25 mg Oral BID   thiamine   100 mg Oral Daily   Continuous Infusions:   LOS: 2 days   Time spent: 25 minutes   Delon Hoe, DO [facility] 01/12/2025, 12:51 PM   Available via Epic secure chat 7am-7pm After these hours, please refer to coverage provider listed on [email or web address]  "

## 2025-01-12 NOTE — Plan of Care (Signed)
  Problem: Activity: Goal: Risk for activity intolerance will decrease Outcome: Progressing   Problem: Nutrition: Goal: Adequate nutrition will be maintained Outcome: Progressing   Problem: Coping: Goal: Level of anxiety will decrease Outcome: Progressing   Problem: Pain Managment: Goal: General experience of comfort will improve and/or be controlled Outcome: Progressing

## 2025-01-12 NOTE — Progress Notes (Signed)
 Occupational Therapy Discharge Patient Details Name: Robin Andrews MRN: [id number] DOB: 01/07/1931 Today's Date: 01/12/2025  Patient discharged from OT services secondary to goals met and no further OT needs identified.  Pt is able to complete basic ADL's with, supervision if needed,  while in hospital room. Recommend home with hired help to assist as needed d/t history of delirium. Per chart review, TOC is working with family to assist with discharge. Acute OT will sign off at this time.    Leita Howell, OTR/L,CBIS  Supplemental OT - [facility] and [facility] Secure Chat Preferred   01/12/2025, 8:48 AM

## 2025-01-12 NOTE — Progress Notes (Signed)
" °   01/12/25 2156  Safety Observation   Observer at bedside Yes    "

## 2025-01-12 NOTE — Progress Notes (Signed)
 Mobility Specialist - Progress Note:   01/12/25 1343  Mobility  Activity Ambulated with assistance  Level of Assistance Contact guard assist, steadying assist  Assistive Device  (HHA)  Distance Ambulated (ft) 400 ft  Activity Response Tolerated well  Mobility Referral Yes  Mobility visit 1 Mobility  Mobility Specialist Start Time (ACUTE ONLY) 1331  Mobility Specialist Stop Time (ACUTE ONLY) 1339  Mobility Specialist Time Calculation (min) (ACUTE ONLY) 8 min   Pt was received in bed and agreed to mobility. Returned to bed with all needs met. Left in room with sitter.  [employer] - Mobility Specialist - Acute Rehabilitation Can be reached via [employer]

## 2025-01-13 DIAGNOSIS — R410 Disorientation, unspecified: Secondary | ICD-10-CM | POA: Diagnosis not present

## 2025-01-13 NOTE — Progress Notes (Signed)
 Spoke with patient through [employer]; introduced myself and requested that Patient share her needs; patient states that she only wants to go home; denies any other needs; refuses morning assessment; notified patient that I would call her grandson; will continue to monitor

## 2025-01-13 NOTE — Plan of Care (Signed)
" °  Problem: Education: Goal: Knowledge of General Education information will improve Description: Including pain rating scale, medication(s)/side effects and non-pharmacologic comfort measures Outcome: Not Progressing   Problem: Health Behavior/Discharge Planning: Goal: Ability to manage health-related needs will improve Outcome: Progressing   Problem: Clinical Measurements: Goal: Ability to maintain clinical measurements within normal limits will improve Outcome: Progressing Goal: Will remain free from infection Outcome: Progressing Goal: Diagnostic test results will improve Outcome: Progressing Goal: Respiratory complications will improve Outcome: Progressing Goal: Cardiovascular complication will be avoided Outcome: Progressing   Problem: Safety: Goal: Ability to remain free from injury will improve Outcome: Progressing   "

## 2025-01-13 NOTE — Progress Notes (Signed)
 " PROGRESS NOTE    Robin Andrews  FMW:[id number] DOB: October 04, 1931 DOA: 01/04/2025 PCP: Luke Agent, MD (Inactive)     Brief Narrative:  Robin Andrews is a 89 y.o. female with PMH significant for DM2, HTN, HLD, subdural hematoma  1/15, patient was wandering on the road, seen by bystanders and called EMS.  Patient reportedly said she was hungry and was just trying to walk to find something to eat. Later in the ED, grandson stated that patient prefers to live by herself and has been confused for last 3 weeks.  In the ED, she also mentioned some auditory hallucinations, thought that somebody was trying to kill her so that is why she was in the street.  Psychiatry also consulted.  New events last 24 hours / Subjective: Patient seen sleeping in bed.  Patient continues to voice her frustration, wanting to go home   Assessment & Plan:   Principal Problem:   Altered mental status Active Problems:   Type 2 diabetes mellitus with hyperlipidemia (HCC)   Hyperlipidemia   HYPERTENSION, BENIGN ESSENTIAL   Confusion   Acute delirium with underlying dementia - Appreciate psychiatry.  Discussed with the [facility] today. - Patient is unsafe to be living independently at home with wandering behavior - Seroquel  - Safe disposition pending  Possible UTI, pyuria - Received Rocephin /Keflex   DVT prophylaxis:  enoxaparin  (LOVENOX ) injection 30 mg Start: 01/04/25 2200  Code Status: DNR Family Communication: None at bedside, discussed with grandson by phone 1/23   Disposition Plan: Placement is pending Status is: Inpatient Remains inpatient appropriate because: Safe disposition pending    Antimicrobials:  Anti-infectives (From admission, onward)    Start     Dose/Rate Route Frequency Ordered Stop   01/06/25 1415  cephALEXin  (KEFLEX ) capsule 500 mg        500 mg Oral Every 8 hours 01/06/25 1323 01/09/25 1359   01/05/25 1615  cefTRIAXone  (ROCEPHIN ) 1 g in sodium chloride  0.9 % 100 mL IVPB  Status:   Discontinued        1 g 200 mL/hr over 30 Minutes Intravenous Every 24 hours 01/05/25 1515 01/06/25 1323        Objective: Vitals:   01/12/25 0916 01/12/25 1409 01/12/25 2011 01/13/25 1041  BP: (!) 146/66 139/64 (!) 126/50 (!) 152/68  Pulse: 81 81 72 84  Resp:   16   Temp:  98.4 F (36.9 C) 98 F (36.7 C)   TempSrc:  Oral Oral   SpO2:  100% 100% 99%  Weight:      Height:        Intake/Output Summary (Last 24 hours) at 01/13/2025 1048 Last data filed at 01/12/2025 1930 Gross per 24 hour  Intake 120 ml  Output --  Net 120 ml    Filed Weights   01/06/25 0426 01/09/25 0442 01/11/25 0500  Weight: 40.8 kg 41.8 kg 45.3 kg    Examination:  General exam: Appears calm and comfortable  Psychiatry: Judgement and insight appear poor   Data Reviewed: I have personally reviewed following labs and imaging studies  CBC: No results for input(s): WBC, NEUTROABS, HGB, HCT, MCV, PLT in the last 168 hours.  Basic Metabolic Panel: No results for input(s): NA, K, CL, CO2, GLUCOSE, BUN, CREATININE, CALCIUM, MG, PHOS in the last 168 hours.  GFR: Estimated Creatinine Clearance: 18.5 mL/min (A) (by C-G formula based on SCr of 1.42 mg/dL (H)). Liver Function Tests: No results for input(s): AST, ALT, ALKPHOS, BILITOT, PROT, ALBUMIN in the  last 168 hours.  No results for input(s): LIPASE, AMYLASE in the last 168 hours. No results for input(s): AMMONIA in the last 168 hours.  Coagulation Profile: No results for input(s): INR, PROTIME in the last 168 hours. Cardiac Enzymes: No results for input(s): CKTOTAL, CKMB, CKMBINDEX, TROPONINI in the last 168 hours. BNP (last 3 results) No results for input(s): PROBNP in the last 8760 hours. HbA1C: No results for input(s): HGBA1C in the last 72 hours. CBG: No results for input(s): GLUCAP in the last 168 hours. Lipid Profile: No results for input(s): CHOL, HDL, LDLCALC,  TRIG, CHOLHDL, LDLDIRECT in the last 72 hours. Thyroid  Function Tests: No results for input(s): TSH, T4TOTAL, FREET4, T3FREE, THYROIDAB in the last 72 hours. Anemia Panel: No results for input(s): VITAMINB12, FOLATE, FERRITIN, TIBC, IRON, RETICCTPCT in the last 72 hours. Sepsis Labs: No results for input(s): PROCALCITON, LATICACIDVEN in the last 168 hours.  No results found for this or any previous visit (from the past 240 hours).    Radiology Studies: No results found.    Scheduled Meds:  amLODipine   5 mg Oral Daily   enoxaparin  (LOVENOX ) injection  30 mg Subcutaneous Q24H   multivitamin with minerals  1 tablet Oral Daily   QUEtiapine   25 mg Oral BID   thiamine   100 mg Oral Daily   Continuous Infusions:   LOS: 3 days   Time spent: 20 minutes   Delon Hoe, DO [facility] 01/13/2025, 10:48 AM   Available via Epic secure chat 7am-7pm After these hours, please refer to coverage provider listed on [email or web address]  "

## 2025-01-13 NOTE — Progress Notes (Signed)
 Mobility Specialist - Progress Note:   01/13/25 1059  Mobility  Activity Ambulated with assistance  Level of Assistance Contact guard assist, steadying assist  Assistive Device  (HHA)  Distance Ambulated (ft) 350 ft  Activity Response Tolerated well  Mobility Referral Yes  Mobility visit 1 Mobility  Mobility Specialist Start Time (ACUTE ONLY) 0909  Mobility Specialist Stop Time (ACUTE ONLY) 0919  Mobility Specialist Time Calculation (min) (ACUTE ONLY) 10 min   Pt was received in doorway and requested to ambulate. Returned to bed with all needs met.  [employer] - Mobility Specialist - Acute Rehabilitation Can be reached via [employer]

## 2025-01-13 NOTE — Plan of Care (Signed)

## 2025-01-13 NOTE — Progress Notes (Signed)
 Patient came out into the hallway to get the RN. RN used the interpreter IPAD. Patient is oriented to self, place and situation. Patient requested help to open her apple pie. RN assisted the patient in opening her apple pie. Patient complained that her eyes and feet hurt. RN asked the patient if she would like tylenol  for her pain. Patient told this RN yes she would like tylenol . RN administered prn tylenol  and prn eye drops per the prn orders. Patient showed this RN her feet and shins. Patient has dry skin, RN offered moisturizing lotion with pink top from supply closet. Patient asked this RN to put the lotion on her feet and shins. RN assisted the patient with putting lotion on her feet and shins. Patient stated she would like to go home so she can shower and feel more comfortable. RN offered the patient to take a shower, patient declined and stated she will wait until she gets home.

## 2025-01-14 DIAGNOSIS — R410 Disorientation, unspecified: Secondary | ICD-10-CM | POA: Diagnosis not present

## 2025-01-14 NOTE — Progress Notes (Addendum)
 " PROGRESS NOTE    Robin Andrews  FMW:[id number] DOB: July 22, 1931 DOA: 01/04/2025 PCP: Luke Agent, MD (Inactive)     Brief Narrative:  Robin Andrews is a 89 y.o. female with PMH significant for DM2, HTN, HLD, subdural hematoma  1/15, patient was wandering on the road, seen by bystanders and called EMS.  Patient reportedly said she was hungry and was just trying to walk to find something to eat. Later in the ED, grandson stated that patient prefers to live by herself and has been confused for last 3 weeks.  In the ED, she also mentioned some auditory hallucinations, thought that somebody was trying to kill her so that is why she was in the street.  Psychiatry also consulted.  New events last 24 hours / Subjective: Ipad interpreter used. Patient tells me that her grandson told her he will pick her up tomorrow.  Complains of eye pain, asking for eyedrops  Assessment & Plan:   Principal Problem:   Altered mental status Active Problems:   Type 2 diabetes mellitus with hyperlipidemia (HCC)   Hyperlipidemia   HYPERTENSION, BENIGN ESSENTIAL   Confusion   Acute delirium with underlying dementia - Appreciate psychiatry.  Discussed with the [facility] today. - Patient is unsafe to be living independently at home with wandering behavior - Seroquel  - Safe disposition pending  Possible UTI, pyuria - Received Rocephin /Keflex   DVT prophylaxis:  enoxaparin  (LOVENOX ) injection 30 mg Start: 01/04/25 2200  Code Status: DNR Family Communication: None at bedside, discussed with grandson by phone 1/23   Disposition Plan: Placement is pending Status is: Inpatient Remains inpatient appropriate because: Safe disposition pending    Antimicrobials:  Anti-infectives (From admission, onward)    Start     Dose/Rate Route Frequency Ordered Stop   01/06/25 1415  cephALEXin  (KEFLEX ) capsule 500 mg        500 mg Oral Every 8 hours 01/06/25 1323 01/09/25 1359   01/05/25 1615  cefTRIAXone  (ROCEPHIN ) 1 g in sodium  chloride 0.9 % 100 mL IVPB  Status:  Discontinued        1 g 200 mL/hr over 30 Minutes Intravenous Every 24 hours 01/05/25 1515 01/06/25 1323        Objective: Vitals:   01/12/25 0916 01/12/25 1409 01/12/25 2011 01/13/25 1041  BP: (!) 146/66 139/64 (!) 126/50 (!) 152/68  Pulse: 81 81 72 84  Resp:   16   Temp:  98.4 F (36.9 C) 98 F (36.7 C)   TempSrc:  Oral Oral   SpO2:  100% 100% 99%  Weight:      Height:        Intake/Output Summary (Last 24 hours) at 01/14/2025 1024 Last data filed at 01/13/2025 1800 Gross per 24 hour  Intake 360 ml  Output --  Net 360 ml    Filed Weights   01/06/25 0426 01/09/25 0442 01/11/25 0500  Weight: 40.8 kg 41.8 kg 45.3 kg    Examination:  General exam: Appears anxious but comfortable  Psychiatry: Judgement and insight appear poor   Data Reviewed: I have personally reviewed following labs and imaging studies  CBC: No results for input(s): WBC, NEUTROABS, HGB, HCT, MCV, PLT in the last 168 hours.  Basic Metabolic Panel: No results for input(s): NA, K, CL, CO2, GLUCOSE, BUN, CREATININE, CALCIUM, MG, PHOS in the last 168 hours.  GFR: Estimated Creatinine Clearance: 18.5 mL/min (A) (by C-G formula based on SCr of 1.42 mg/dL (H)). Liver Function Tests: No results for input(s):  AST, ALT, ALKPHOS, BILITOT, PROT, ALBUMIN in the last 168 hours.  No results for input(s): LIPASE, AMYLASE in the last 168 hours. No results for input(s): AMMONIA in the last 168 hours.  Coagulation Profile: No results for input(s): INR, PROTIME in the last 168 hours. Cardiac Enzymes: No results for input(s): CKTOTAL, CKMB, CKMBINDEX, TROPONINI in the last 168 hours. BNP (last 3 results) No results for input(s): PROBNP in the last 8760 hours. HbA1C: No results for input(s): HGBA1C in the last 72 hours. CBG: No results for input(s): GLUCAP in the last 168 hours. Lipid Profile: No results  for input(s): CHOL, HDL, LDLCALC, TRIG, CHOLHDL, LDLDIRECT in the last 72 hours. Thyroid  Function Tests: No results for input(s): TSH, T4TOTAL, FREET4, T3FREE, THYROIDAB in the last 72 hours. Anemia Panel: No results for input(s): VITAMINB12, FOLATE, FERRITIN, TIBC, IRON, RETICCTPCT in the last 72 hours. Sepsis Labs: No results for input(s): PROCALCITON, LATICACIDVEN in the last 168 hours.  No results found for this or any previous visit (from the past 240 hours).    Radiology Studies: No results found.    Scheduled Meds:  amLODipine   5 mg Oral Daily   enoxaparin  (LOVENOX ) injection  30 mg Subcutaneous Q24H   multivitamin with minerals  1 tablet Oral Daily   QUEtiapine   25 mg Oral BID   thiamine   100 mg Oral Daily   Continuous Infusions:   LOS: 4 days   Time spent: 20 minutes   Delon Hoe, DO [facility] 01/14/2025, 10:24 AM   Available via Epic secure chat 7am-7pm After these hours, please refer to coverage provider listed on [email or web address]  "

## 2025-01-14 NOTE — Plan of Care (Signed)

## 2025-01-15 NOTE — TOC Progression Note (Addendum)
 Transition of Care [facility]) - Progression Note    Patient Details  Name: Robin Andrews MRN: [id number] Date of Birth: 12-03-1931  [facility] CM/SW Contact  Doneta Glenys ONEIDA, RN Phone Number: 01/15/2025, 1:54 PM  Clinical Narrative:    2:01 PM CM spoke with patients grandson Phi [phone]. Phi is planning on pick patient up tomorrow 1/27 after 2pm. Phi is working with patients PCP for assistance with placement. 1:54 PM CM called  [facility] - left message with [facility] Admissions Coordinator.   Expected Discharge Plan: Skilled Nursing Facility Barriers to Discharge: Continued Medical Work up               Expected Discharge Plan and Services In-house Referral: NA Discharge Planning Services: CM Consult   Living arrangements for the past 2 months: Apartment                 DME Arranged: N/A DME Agency: NA       HH Arranged: NA HH Agency: NA         Social Drivers of Health (SDOH) Interventions SDOH Screenings   Housing: Low Risk (01/05/2025)  Social Connections: Unknown (01/05/2025)  Tobacco Use: Low Risk (01/04/2025)    Readmission Risk Interventions     No data to display

## 2025-01-15 NOTE — Progress Notes (Signed)
 " PROGRESS NOTE    Robin Andrews  FMW:[id number] DOB: June 23, 1931 DOA: 01/04/2025 PCP: Luke Agent, MD (Inactive)     Brief Narrative:  Robin Andrews is a 89 y.o. female with PMH significant for DM2, HTN, HLD, subdural hematoma  1/15, patient was wandering on the road, seen by bystanders and called EMS.  Patient reportedly said she was hungry and was just trying to walk to find something to eat. Later in the ED, grandson stated that patient prefers to live by herself and has been confused for last 3 weeks.  In the ED, she also mentioned some auditory hallucinations, thought that somebody was trying to kill her so that is why she was in the street.  Psychiatry also consulted and they decided she has capacity.   New events last 24 hours / Subjective: Patient wants to go home.  Patient's grandson to pick her up tomorrow.  Denies pain, shortness of breath, fevers.  Of note, a Vietnamese video interpreter was present but the patient answered questions appropriately in English.  Assessment & Plan:   Principal Problem:   Altered mental status Active Problems:   Hyperlipidemia   HYPERTENSION, BENIGN ESSENTIAL   Type 2 diabetes mellitus with hyperlipidemia (HCC)   Confusion   Acute delirium with underlying dementia - Appreciate psychiatry.  Discussed with the [facility] today. - Patient is unsafe to be living independently at home with wandering behavior - Seroquel  - Plan is home tomorrow around 2 PM, 01/16/2025   Possible UTI, pyuria - Received Rocephin /Keflex , no longer taking   DVT prophylaxis:  enoxaparin  (LOVENOX ) injection 30 mg Start: 01/04/25 2200 Code Status: DNR Family Communication: None at bedside Disposition Plan: Placement is pending, care management called [facility] Status is: Inpatient Remains inpatient appropriate because: Safe disposition pending   Antimicrobials:  Anti-infectives (From admission, onward)    Start     Dose/Rate Route Frequency Ordered Stop    01/06/25 1415  cephALEXin  (KEFLEX ) capsule 500 mg        500 mg Oral Every 8 hours 01/06/25 1323 01/09/25 1359   01/05/25 1615  cefTRIAXone  (ROCEPHIN ) 1 g in sodium chloride  0.9 % 100 mL IVPB  Status:  Discontinued        1 g 200 mL/hr over 30 Minutes Intravenous Every 24 hours 01/05/25 1515 01/06/25 1323        Objective: Vitals:   01/13/25 1041 01/14/25 1347 01/14/25 2033 01/15/25 0443  BP: (!) 152/68 (!) 148/68 (!) 136/58 (!) 146/68  Pulse: 84 83 73 78  Resp:  16 16 18   Temp:  97.9 F (36.6 C) 98.1 F (36.7 C) 98.3 F (36.8 C)  TempSrc:   Oral Oral  SpO2: 99% 100% 100% 98%  Weight:      Height:       No intake or output data in the 24 hours ending 01/15/25 1538 Filed Weights   01/06/25 0426 01/09/25 0442 01/11/25 0500  Weight: 40.8 kg 41.8 kg 45.3 kg    Examination:  General exam: Appears calm and comfortable  Respiratory system: Clear to auscultation. Respiratory effort normal. No respiratory distress. No conversational dyspnea.  Cardiovascular system: S1 & S2 heard, RRR. No murmurs. No pedal edema. Gastrointestinal system: Abdomen is nondistended, soft and nontender. Normal bowel sounds heard. Central nervous system: Alert and oriented. No focal neurological deficits. Speech clear.  Extremities: Symmetric in appearance  Skin: No rashes, lesions or ulcers on exposed skin  Psychiatry: Judgement and insight appear normal. Mood & affect  appropriate.   Data Reviewed: I have personally reviewed following labs and imaging studies   Scheduled Meds:  amLODipine   5 mg Oral Daily   enoxaparin  (LOVENOX ) injection  30 mg Subcutaneous Q24H   multivitamin with minerals  1 tablet Oral Daily   QUEtiapine   25 mg Oral BID   thiamine   100 mg Oral Daily   Continuous Infusions:   LOS: 5 days    Time spent: 25 minutes   Mabel Deward Pry, DO [facility] 01/15/2025, 3:38 PM   Available via Epic secure chat 7am-7pm After these hours, please refer to coverage  provider listed on [email or web address]  "

## 2025-01-15 NOTE — Plan of Care (Signed)
  Problem: Clinical Measurements: ?Goal: Ability to maintain clinical measurements within normal limits will improve ?Outcome: Progressing ?Goal: Will remain free from infection ?Outcome: Progressing ?Goal: Diagnostic test results will improve ?Outcome: Progressing ?Goal: Respiratory complications will improve ?Outcome: Progressing ?  ?Problem: Activity: ?Goal: Risk for activity intolerance will decrease ?Outcome: Progressing ?  ?

## 2025-01-15 NOTE — Plan of Care (Signed)

## 2025-01-16 ENCOUNTER — Other Ambulatory Visit (HOSPITAL_COMMUNITY): Payer: Self-pay

## 2025-01-16 ENCOUNTER — Other Ambulatory Visit: Payer: Self-pay

## 2025-01-16 MED ORDER — QUETIAPINE FUMARATE 25 MG PO TABS
25.0000 mg | ORAL_TABLET | Freq: Two times a day (BID) | ORAL | 0 refills | Status: AC
  Filled 2025-01-16 (×3): qty 60, 30d supply, fill #0

## 2025-01-16 NOTE — Plan of Care (Signed)
   Problem: Health Behavior/Discharge Planning: Goal: Ability to manage health-related needs will improve Outcome: Progressing

## 2025-01-16 NOTE — TOC Transition Note (Signed)
 Transition of Care [facility]) - Discharge Note   Patient Details  Name: Robin Andrews MRN: [id number] Date of Birth: [birth year]  [facility]) CM/SW Contact:  Robin Glenys ONEIDA, RN Phone Number: 01/16/2025, 4:50 PM   Clinical Narrative:    CM called Robin Andrews (grandson) and placed on speaker phone Robin Andrews and Robin Lawn T) for nurses to verify communication of plan of care. Patients Robin Andrews Idalia [phone]) is transporting patient to her home. Robin Andrews said his mother Robin Andrews) will be meeting the patient there. He (Robin Andrews) was at the court house and said he would be heading to patients home.  CM signing off.    Final next level of care: Home/Self Care Barriers to Discharge: Barriers Resolved   Patient Goals and CMS Choice Patient states their goals for this hospitalization and ongoing recovery are:: Home CMS Medicare.gov Compare Post Acute Care list provided to:: Patient Represenative (must comment) (Robin Andrews) Choice offered to / list presented to : [name] ownership interest in [facility].provided to:: [name]    Discharge Placement                       Discharge Plan and Services Additional resources added to the After Visit Summary for   In-house Referral: NA Discharge Planning Services: CM Consult            DME Arranged: N/A DME Agency: NA       HH Arranged: NA HH Agency: NA        Social Drivers of Health (SDOH) Interventions SDOH Screenings   Housing: Low Risk (01/05/2025)  Social Connections: Unknown (01/05/2025)  Tobacco Use: Low Risk (01/04/2025)     Readmission Risk Interventions     No data to display

## 2025-01-16 NOTE — Plan of Care (Signed)
 Patient was discharged to go home today. Discharge instructions were given to the patients pastor. All personal belongings were sent home with the patient, her medications were sent home with her as well. Vitals were stable. Patient left via private vehicle with her pastor to go home. The grandson wife is supposed to be meeting them at their home.

## 2025-01-16 NOTE — Discharge Summary (Signed)
 Physician Discharge Summary  Robin Andrews FMW:[id number] DOB: 09-11-1931 DOA: 01/04/2025  PCP: Luke Agent, MD (Inactive)  Admit date: 01/04/2025 Discharge date: 01/16/2025  Admitted From: Home Disposition:  Home  Recommendations for Outpatient Follow-up:  Follow up with PCP   Discharge Condition: Stable CODE STATUS: DNR  Diet recommendation: Regular  Brief/Interim Summary: Robin Andrews is a 89 y.o. female with PMH significant for DM2, HTN, HLD, subdural hematoma  1/15, patient was wandering on the road, seen by bystanders and called EMS.  Patient reportedly said she was hungry and was just trying to walk to find something to eat. Later in the ED, grandson stated that patient prefers to live by herself and has been confused for last 3 weeks.  In the ED, she also mentioned some auditory hallucinations, thought that somebody was trying to kill her so that is why she was in the street.  Psychiatry also consulted. Patient was monitored closely for her safety. TOC attempted to find placement for patient without success. Ultimately, patient was discharged home under care of her grandson - he will continue to try finding longterm placement for patient.   Discharge Diagnoses:   Principal Problem:   Altered mental status Active Problems:   Type 2 diabetes mellitus with hyperlipidemia (HCC)   Hyperlipidemia   HYPERTENSION, BENIGN ESSENTIAL   Confusion   Acute delirium with underlying dementia - Appreciate psychiatry - Patient is unsafe to be living independently at home with wandering behavior. Discussed with grandson. Unable to find placement for patient. Family will take patient home and continue to find longterm placement for her.    Possible UTI, pyuria - Received Rocephin /Keflex   Discharge Instructions  Discharge Instructions     Call MD for:  difficulty breathing, headache or visual disturbances   Complete by: As directed    Call MD for:  extreme fatigue   Complete by: As  directed    Call MD for:  persistant dizziness or light-headedness   Complete by: As directed    Call MD for:  persistant nausea and vomiting   Complete by: As directed    Call MD for:  severe uncontrolled pain   Complete by: As directed    Call MD for:  temperature >100.4   Complete by: As directed    Diet general   Complete by: As directed    Discharge instructions   Complete by: As directed    You were cared for by a hospitalist during your hospital stay. If you have any questions about your discharge medications or the care you received while you were in the hospital after you are discharged, you can call the unit and ask to speak with the hospitalist on call if the hospitalist that took care of you is not available. Once you are discharged, your primary care physician will handle any further medical issues. Please note that NO REFILLS for any discharge medications will be authorized once you are discharged, as it is imperative that you return to your primary care physician (or establish a relationship with a primary care physician if you do not have one) for your aftercare needs so that they can reassess your need for medications and monitor your lab values.   Increase activity slowly   Complete by: As directed       Allergies as of 01/16/2025       Reactions   Hydrochlorothiazide  Hives, Itching   Severe hyponatremia resulting in hospitalization   Lisinopril Other (See Comments)   Unknown  per grandson         Medication List     TAKE these medications    amLODipine  5 MG tablet Commonly known as: NORVASC  Take 5 mg by mouth daily.   multivitamin with minerals Tabs tablet Take 1 tablet by mouth daily.   QUEtiapine  25 MG tablet Commonly known as: SEROQUEL  Take 1 tablet (25 mg total) by mouth 2 (two) times daily.        Follow-up Information     Luke Agent, MD Follow up.   Specialty: Internal Medicine Contact information: [address] [place]  [address] [place] [address] [phone]                Allergies[1]  Consultations: Psychiatry    Procedures/Studies: CT Head Wo Contrast Result Date: 01/04/2025 EXAM: CT HEAD WITHOUT CONTRAST 01/04/2025 03:05:02 PM TECHNIQUE: CT of the head was performed without the administration of intravenous contrast. Automated exposure control, iterative reconstruction, and/or weight based adjustment of the mA/kV was utilized to reduce the radiation dose to as low as reasonably achievable. Motion limited study. COMPARISON: 09/05/2021 CLINICAL HISTORY: Mental status change, unknown cause. FINDINGS: BRAIN AND VENTRICLES: No acute hemorrhage. No evidence of acute infarct. No hydrocephalus. No extra-axial collection. No mass effect or midline shift. Mild chronic microvascular ischemic changes. ORBITS: Limited evaluation of the orbits due to motion artifact. SINUSES: No acute abnormality. SOFT TISSUES AND SKULL: No acute soft tissue abnormality. No skull fracture. IMPRESSION: 1. No acute intracranial abnormality. 2. Motion limited exam. Electronically signed by: Donnice Mania MD 01/04/2025 03:12 PM EST RP Workstation: [id number]      Discharge Exam: Vitals:   01/15/25 2039 01/16/25 0546  BP: (!) 151/65 (!) 146/60  Pulse: 80 75  Resp: 18 14  Temp: 97.8 F (36.6 C)   SpO2: 99% 99%    General: Pt is alert, awake, not in acute distress Cardiovascular: RRR, S1/S2 +, no edema Respiratory: CTA bilaterally, no wheezing, no rhonchi, no respiratory distress, no conversational dyspnea  Abdominal: Soft, NT, ND, bowel sounds + Extremities: no edema, no cyanosis   The results of significant diagnostics from this hospitalization (including imaging, microbiology, ancillary and laboratory) are listed below for reference.     Microbiology: No results found for this or any previous visit (from the past 240 hours).   Labs: BNP (last 3 results) No results for input(s): BNP in the last 8760 hours. Basic  Metabolic Panel: No results for input(s): NA, K, CL, CO2, GLUCOSE, BUN, CREATININE, CALCIUM, MG, PHOS in the last 168 hours. Liver Function Tests: No results for input(s): AST, ALT, ALKPHOS, BILITOT, PROT, ALBUMIN in the last 168 hours. No results for input(s): LIPASE, AMYLASE in the last 168 hours. No results for input(s): AMMONIA in the last 168 hours. CBC: No results for input(s): WBC, NEUTROABS, HGB, HCT, MCV, PLT in the last 168 hours. Cardiac Enzymes: No results for input(s): CKTOTAL, CKMB, CKMBINDEX, TROPONINI in the last 168 hours. BNP: Invalid input(s): POCBNP CBG: No results for input(s): GLUCAP in the last 168 hours. D-Dimer No results for input(s): DDIMER in the last 72 hours. Hgb A1c No results for input(s): HGBA1C in the last 72 hours. Lipid Profile No results for input(s): CHOL, HDL, LDLCALC, TRIG, CHOLHDL, LDLDIRECT in the last 72 hours. Thyroid  function studies No results for input(s): TSH, T4TOTAL, T3FREE, THYROIDAB in the last 72 hours.  Invalid input(s): FREET3 Anemia work up No results for input(s): VITAMINB12, FOLATE, FERRITIN, TIBC, IRON, RETICCTPCT in the last 72 hours. Urinalysis  Component Value Date/Time   COLORURINE YELLOW 01/04/2025 1349   APPEARANCEUR HAZY (A) 01/04/2025 1349   LABSPEC 1.013 01/04/2025 1349   PHURINE 5.0 01/04/2025 1349   GLUCOSEU NEGATIVE 01/04/2025 1349   HGBUR NEGATIVE 01/04/2025 1349   BILIRUBINUR NEGATIVE 01/04/2025 1349   BILIRUBINUR negative 11/08/2018 1136   BILIRUBINUR neg 10/16/2013 1122   KETONESUR NEGATIVE 01/04/2025 1349   PROTEINUR 30 (A) 01/04/2025 1349   UROBILINOGEN 0.2 11/08/2018 1136   UROBILINOGEN 0.2 06/01/2013 1135   NITRITE NEGATIVE 01/04/2025 1349   LEUKOCYTESUR SMALL (A) 01/04/2025 1349   Sepsis Labs No results for input(s): WBC in the last 168 hours.  Invalid input(s): PROCALCITONIN,  LACTICIDVEN Microbiology No results found for this or any previous visit (from the past 240 hours).   Patient was seen and examined on the day of discharge and was found to be in stable condition. Time coordinating discharge: 25 minutes including assessment and coordination of care, as well as examination of the patient.   SIGNED:  Delon Hoe, DO [facility] 01/16/2025, 11:01 AM       [1]  Allergies Allergen Reactions   Hydrochlorothiazide  Hives and Itching    Severe hyponatremia resulting in hospitalization   Lisinopril Other (See Comments)    Unknown per grandson

## 2025-01-16 NOTE — TOC Progression Note (Addendum)
 Transition of Care [facility]) - Progression Note    Patient Details  Name: Robin Andrews MRN: [id number] Date of Birth: 02-Dec-1931  [facility] CM/SW Contact  Doneta Glenys ONEIDA, RN Phone Number: 01/16/2025, 10:52 AM  Clinical Narrative:   1:50 PM Phi returned call stating that patient [name] will be picking up the patient. CM requested the Pastors name and telephone number and Phi will send CM a text. 1:35 PM CM called Leontine Girtha Rom, Emergency Contact [phone] - left message. 10:52  CM called Leontine Girtha Rom, Emergency Contact [phone] - left message.   Expected Discharge Plan: Skilled Nursing Facility Barriers to Discharge: Continued Medical Work up               Expected Discharge Plan and Services In-house Referral: NA Discharge Planning Services: CM Consult   Living arrangements for the past 2 months: Apartment                 DME Arranged: N/A DME Agency: NA       HH Arranged: NA HH Agency: NA         Social Drivers of Health (SDOH) Interventions SDOH Screenings   Housing: Low Risk (01/05/2025)  Social Connections: Unknown (01/05/2025)  Tobacco Use: Low Risk (01/04/2025)    Readmission Risk Interventions     No data to display

## 2025-01-16 NOTE — Progress Notes (Signed)
 Patients pastor is here and present to pick patient up for discharge. [facility] charge nurse checking for home medications and belongings.

## 2025-01-16 NOTE — Final Progress Note (Signed)
 AVS reviewed with pt's pastor using interpreter via tablet. Pastor verbalized understanding. Home meds returned to patient. All other belongings accounted for. All other needs were addressed.

## 2025-01-16 NOTE — Progress Notes (Signed)
 Discharge meds in a secure bag delivered to patient by this RN

## 2025-01-23 ENCOUNTER — Emergency Department (HOSPITAL_COMMUNITY)

## 2025-01-23 ENCOUNTER — Emergency Department (HOSPITAL_COMMUNITY)
Admission: EM | Admit: 2025-01-23 | Discharge: 2025-01-24 | Disposition: A | Attending: Emergency Medicine | Admitting: Emergency Medicine

## 2025-01-23 DIAGNOSIS — R10819 Abdominal tenderness, unspecified site: Secondary | ICD-10-CM | POA: Insufficient documentation

## 2025-01-23 DIAGNOSIS — E871 Hypo-osmolality and hyponatremia: Secondary | ICD-10-CM | POA: Insufficient documentation

## 2025-01-23 DIAGNOSIS — F0390 Unspecified dementia without behavioral disturbance: Secondary | ICD-10-CM | POA: Insufficient documentation

## 2025-01-23 DIAGNOSIS — R42 Dizziness and giddiness: Secondary | ICD-10-CM | POA: Insufficient documentation

## 2025-01-23 DIAGNOSIS — D72829 Elevated white blood cell count, unspecified: Secondary | ICD-10-CM | POA: Insufficient documentation

## 2025-01-23 LAB — CBC WITH DIFFERENTIAL/PLATELET
Abs Immature Granulocytes: 0.07 10*3/uL (ref 0.00–0.07)
Basophils Absolute: 0.1 10*3/uL (ref 0.0–0.1)
Basophils Relative: 1 %
Eosinophils Absolute: 0.5 10*3/uL (ref 0.0–0.5)
Eosinophils Relative: 4 %
HCT: 25.7 % — ABNORMAL LOW (ref 36.0–46.0)
Hemoglobin: 8.7 g/dL — ABNORMAL LOW (ref 12.0–15.0)
Immature Granulocytes: 1 %
Lymphocytes Relative: 19 %
Lymphs Abs: 2.4 10*3/uL (ref 0.7–4.0)
MCH: 30.6 pg (ref 26.0–34.0)
MCHC: 33.9 g/dL (ref 30.0–36.0)
MCV: 90.5 fL (ref 80.0–100.0)
Monocytes Absolute: 1.1 10*3/uL — ABNORMAL HIGH (ref 0.1–1.0)
Monocytes Relative: 9 %
Neutro Abs: 8.5 10*3/uL — ABNORMAL HIGH (ref 1.7–7.7)
Neutrophils Relative %: 66 %
Platelets: 358 10*3/uL (ref 150–400)
RBC: 2.84 MIL/uL — ABNORMAL LOW (ref 3.87–5.11)
RDW: 11.9 % (ref 11.5–15.5)
WBC: 12.5 10*3/uL — ABNORMAL HIGH (ref 4.0–10.5)
nRBC: 0 % (ref 0.0–0.2)

## 2025-01-23 LAB — COMPREHENSIVE METABOLIC PANEL WITH GFR
ALT: 17 U/L (ref 0–44)
AST: 25 U/L (ref 15–41)
Albumin: 4 g/dL (ref 3.5–5.0)
Alkaline Phosphatase: 87 U/L (ref 38–126)
Anion gap: 15 (ref 5–15)
BUN: 19 mg/dL (ref 8–23)
CO2: 22 mmol/L (ref 22–32)
Calcium: 8.6 mg/dL — ABNORMAL LOW (ref 8.9–10.3)
Chloride: 97 mmol/L — ABNORMAL LOW (ref 98–111)
Creatinine, Ser: 1.36 mg/dL — ABNORMAL HIGH (ref 0.44–1.00)
GFR, Estimated: 37 mL/min — ABNORMAL LOW
Glucose, Bld: 106 mg/dL — ABNORMAL HIGH (ref 70–99)
Potassium: 3.7 mmol/L (ref 3.5–5.1)
Sodium: 134 mmol/L — ABNORMAL LOW (ref 135–145)
Total Bilirubin: 0.4 mg/dL (ref 0.0–1.2)
Total Protein: 6.9 g/dL (ref 6.5–8.1)

## 2025-01-23 LAB — URINALYSIS, ROUTINE W REFLEX MICROSCOPIC
Bilirubin Urine: NEGATIVE
Glucose, UA: NEGATIVE mg/dL
Hgb urine dipstick: NEGATIVE
Ketones, ur: NEGATIVE mg/dL
Leukocytes,Ua: NEGATIVE
Nitrite: NEGATIVE
Protein, ur: NEGATIVE mg/dL
Specific Gravity, Urine: 1.008 (ref 1.005–1.030)
pH: 5 (ref 5.0–8.0)

## 2025-01-23 LAB — CBG MONITORING, ED: Glucose-Capillary: 84 mg/dL (ref 70–99)

## 2025-01-23 LAB — MAGNESIUM: Magnesium: 2.8 mg/dL — ABNORMAL HIGH (ref 1.7–2.4)

## 2025-01-23 LAB — POC OCCULT BLOOD, ED: Fecal Occult Blood, POC: NEGATIVE

## 2025-01-23 LAB — TROPONIN T, HIGH SENSITIVITY: Troponin T High Sensitivity: 13 ng/L (ref 0–19)

## 2025-01-23 NOTE — ED Triage Notes (Addendum)
 Pt BIB EMS from PCP office. Pt complaining of dizziness, feeling hungry, and fatigue. Pt lives alone and has hx of dementia. Upon arrival Vietnamese translator is used and pt is repeating I am hungry. Pt unable to answer questions asked by this RN and appears anxious.   EMS Vitals BP 146/60 HR 82 RR 24 CBG 120

## 2025-01-23 NOTE — ED Notes (Signed)
 Phlebotomist contacted to come collect labs.

## 2025-01-23 NOTE — ED Notes (Signed)
 Spoke to patient's grandson, Phi Nguyen [phone]) regarding patient's discharge instructions. Per Phi, there are no family members available to provide patient with a ride home. States patient lives alone and will be able to let herself in to her apartment.

## 2025-01-24 NOTE — ED Notes (Signed)
 Patient assisted in walking to restroom and is currently eating from breakfast tray.

## 2025-01-24 NOTE — Progress Notes (Signed)
 CSW spoke w/ Leontine Girtha Rom, [phone] and he states that he lives an hour away but will can be here around 1pm to transport pt home. Team notified via secure chat.

## 2025-01-24 NOTE — ED Notes (Signed)
 HIPAA compliant voicemail left for Girtha Miyamoto [phone]) requesting call back regarding patient's transportation home.

## 2025-01-24 NOTE — Discharge Planning (Signed)
 Awaiting family call back and either agree to meet PTAR at the residence or to pick pt up. [facility] will not take pt home unless family is there because she is alert but only oriented x1.  Laela Deviney J. Debarah, BSN, RN, [facility]  Inpatient Care Management  Nurse Case Manager  [facility] Emergency Departments  Operative Services  [phone]
